# Patient Record
Sex: Female | Born: 1935 | Race: White | Hispanic: No | Smoking: Never smoker
Health system: Southern US, Community
[De-identification: ages and names within clinical notes are randomized; demographics above are authoritative.]

## PROBLEM LIST (undated history)

## (undated) DIAGNOSIS — R519 Headache, unspecified: Secondary | ICD-10-CM

## (undated) DIAGNOSIS — M810 Age-related osteoporosis without current pathological fracture: Secondary | ICD-10-CM

## (undated) DIAGNOSIS — I5189 Other ill-defined heart diseases: Secondary | ICD-10-CM

## (undated) DIAGNOSIS — E785 Hyperlipidemia, unspecified: Secondary | ICD-10-CM

## (undated) DIAGNOSIS — Z9289 Personal history of other medical treatment: Secondary | ICD-10-CM

## (undated) DIAGNOSIS — R51 Headache: Secondary | ICD-10-CM

## (undated) DIAGNOSIS — I480 Paroxysmal atrial fibrillation: Secondary | ICD-10-CM

## (undated) DIAGNOSIS — D649 Anemia, unspecified: Secondary | ICD-10-CM

## (undated) DIAGNOSIS — M1990 Unspecified osteoarthritis, unspecified site: Secondary | ICD-10-CM

## (undated) DIAGNOSIS — C801 Malignant (primary) neoplasm, unspecified: Secondary | ICD-10-CM

## (undated) HISTORY — DX: Personal history of other medical treatment: Z92.89

## (undated) HISTORY — PX: EYE SURGERY: SHX253

## (undated) HISTORY — DX: Other ill-defined heart diseases: I51.89

---

## 2004-08-30 ENCOUNTER — Ambulatory Visit: Payer: Self-pay | Admitting: Internal Medicine

## 2005-06-28 ENCOUNTER — Encounter: Payer: Self-pay | Admitting: Internal Medicine

## 2005-09-01 ENCOUNTER — Ambulatory Visit: Payer: Self-pay | Admitting: Internal Medicine

## 2009-01-21 ENCOUNTER — Ambulatory Visit: Payer: Self-pay | Admitting: Internal Medicine

## 2010-02-15 ENCOUNTER — Ambulatory Visit: Payer: Self-pay | Admitting: Internal Medicine

## 2010-08-28 ENCOUNTER — Observation Stay: Payer: Self-pay | Admitting: Surgery

## 2011-01-26 ENCOUNTER — Ambulatory Visit: Payer: Self-pay | Admitting: Internal Medicine

## 2011-03-16 ENCOUNTER — Ambulatory Visit: Payer: Self-pay | Admitting: Internal Medicine

## 2011-06-09 ENCOUNTER — Ambulatory Visit: Payer: Self-pay | Admitting: Internal Medicine

## 2013-04-04 ENCOUNTER — Encounter: Payer: Self-pay | Admitting: *Deleted

## 2013-04-08 ENCOUNTER — Encounter: Payer: Self-pay | Admitting: Podiatry

## 2013-04-08 ENCOUNTER — Ambulatory Visit (INDEPENDENT_AMBULATORY_CARE_PROVIDER_SITE_OTHER): Payer: Medicare Other | Admitting: Podiatry

## 2013-04-08 VITALS — BP 105/52 | HR 80 | Resp 16 | Ht 65.0 in | Wt 127.0 lb

## 2013-04-08 DIAGNOSIS — B351 Tinea unguium: Secondary | ICD-10-CM

## 2013-04-08 DIAGNOSIS — M79609 Pain in unspecified limb: Secondary | ICD-10-CM

## 2013-04-08 NOTE — Progress Notes (Signed)
Christy Waters presents today for followup of her painful mycotic nails 1 through 5 bilateral.  Objective: Vital signs are stable she is alert and oriented x3. Pulses remain palpable bilateral lower extremity. Nails are thick yellow dystrophic onychomycotic and painful bilateral.  Assessment: Pain in limb secondary to onychomycosis.  Plan: Debridement of nails 1 through 5 bilateral is cover service secondary to pain followup with her in 3 months.

## 2013-06-17 ENCOUNTER — Ambulatory Visit (INDEPENDENT_AMBULATORY_CARE_PROVIDER_SITE_OTHER): Payer: Medicare Other | Admitting: Podiatry

## 2013-06-17 ENCOUNTER — Encounter: Payer: Self-pay | Admitting: Podiatry

## 2013-06-17 VITALS — BP 150/81 | HR 85 | Resp 16 | Ht 66.0 in | Wt 124.0 lb

## 2013-06-17 DIAGNOSIS — B351 Tinea unguium: Secondary | ICD-10-CM

## 2013-06-17 DIAGNOSIS — M79609 Pain in unspecified limb: Secondary | ICD-10-CM

## 2013-06-17 NOTE — Progress Notes (Signed)
She presents today with a chief complaint of painful toenails one through 5 bilateral.  Objective: Vital signs are stable she is alert and oriented x3. Pulses are palpable bilateral. Multiple varicosities noted bilateral. No open lesions noted. She has thick yellow dystrophic clinically mycotic nails which are painful palpation as well as debridement.  Assessment: Pain in limb secondary to onychomycosis 1 through 5 bilateral.  Plan: Debridement of nails 1 through 5 bilateral is a covered service secondary to pain.

## 2013-07-13 ENCOUNTER — Emergency Department: Payer: Self-pay | Admitting: Emergency Medicine

## 2013-07-13 LAB — BASIC METABOLIC PANEL
Anion Gap: 6 — ABNORMAL LOW (ref 7–16)
BUN: 25 mg/dL — ABNORMAL HIGH (ref 7–18)
CHLORIDE: 104 mmol/L (ref 98–107)
Calcium, Total: 9.1 mg/dL (ref 8.5–10.1)
Co2: 28 mmol/L (ref 21–32)
Creatinine: 0.93 mg/dL (ref 0.60–1.30)
EGFR (African American): >60
EGFR (Non-African Amer.): 53 — ABNORMAL LOW
Glucose: 137 mg/dL — ABNORMAL HIGH (ref 65–99)
Osmolality: 282 (ref 275–301)
POTASSIUM: 3.4 mmol/L — AB (ref 3.5–5.1)
Sodium: 138 mmol/L (ref 136–145)

## 2013-07-13 LAB — CBC WITH DIFFERENTIAL/PLATELET
BASOS ABS: 0 10*3/uL (ref 0.0–0.1)
BASOS PCT: 0.5 %
EOS PCT: 1.1 %
Eosinophil #: 0.1 10*3/uL (ref 0.0–0.7)
HCT: 34.3 % — ABNORMAL LOW (ref 35.0–47.0)
HGB: 11.4 g/dL — ABNORMAL LOW (ref 12.0–16.0)
LYMPHS PCT: 10.6 %
Lymphocyte #: 0.8 10*3/uL — ABNORMAL LOW (ref 1.0–3.6)
MCH: 32.1 pg (ref 26.0–34.0)
MCHC: 33.2 g/dL (ref 32.0–36.0)
MCV: 97 fL (ref 80–100)
MONOS PCT: 10.2 %
Monocyte #: 0.8 x10 3/mm (ref 0.2–0.9)
NEUTROS ABS: 6.2 10*3/uL (ref 1.4–6.5)
NEUTROS PCT: 77.6 %
Platelet: 203 10*3/uL (ref 150–440)
RBC: 3.55 10*6/uL — ABNORMAL LOW (ref 3.80–5.20)
RDW: 13.3 % (ref 11.5–14.5)
WBC: 8 10*3/uL (ref 3.6–11.0)

## 2013-07-13 LAB — URINALYSIS, COMPLETE
BACTERIA: NONE SEEN
BILIRUBIN, UR: NEGATIVE
Blood: NEGATIVE
Glucose,UR: 50 mg/dL (ref 0–75)
Hyaline Cast: 4
KETONE: NEGATIVE
Leukocyte Esterase: NEGATIVE
NITRITE: NEGATIVE
PH: 6 (ref 4.5–8.0)
Protein: 30
RBC,UR: 2 /HPF (ref 0–5)
Specific Gravity: 1.015 (ref 1.003–1.030)
Squamous Epithelial: 1
WBC UR: 1 /HPF (ref 0–5)

## 2013-07-13 LAB — TROPONIN I: Troponin-I: <0.02 ng/mL

## 2013-08-19 ENCOUNTER — Ambulatory Visit (INDEPENDENT_AMBULATORY_CARE_PROVIDER_SITE_OTHER): Payer: Medicare Other | Admitting: Podiatry

## 2013-08-19 VITALS — BP 122/65 | HR 67 | Resp 16 | Ht 66.0 in | Wt 127.0 lb

## 2013-08-19 DIAGNOSIS — B351 Tinea unguium: Secondary | ICD-10-CM

## 2013-08-19 DIAGNOSIS — M79609 Pain in unspecified limb: Secondary | ICD-10-CM

## 2013-08-19 NOTE — Progress Notes (Signed)
She presents today with a chief complaint of painful E. elongated toenails.  Objective: Vital signs are stable she is alert and oriented x3. Pulses are palpable bilateral. Multiple varicosities are noted. There no iatrogenic lesions and no cellulitis. Nails are thick yellow dystrophic onychomycotic and painful palpation.  Assessment: Pain in limb secondary to onychomycosis 1 through 5 bilateral.  Plan: Debridement of nails 1 through 5 bilateral covered service secondary to pain.

## 2013-10-03 DIAGNOSIS — I10 Essential (primary) hypertension: Secondary | ICD-10-CM | POA: Insufficient documentation

## 2013-10-03 DIAGNOSIS — M810 Age-related osteoporosis without current pathological fracture: Secondary | ICD-10-CM | POA: Insufficient documentation

## 2013-10-03 DIAGNOSIS — I493 Ventricular premature depolarization: Secondary | ICD-10-CM | POA: Insufficient documentation

## 2013-10-03 DIAGNOSIS — J8410 Pulmonary fibrosis, unspecified: Secondary | ICD-10-CM | POA: Insufficient documentation

## 2013-10-03 DIAGNOSIS — E785 Hyperlipidemia, unspecified: Secondary | ICD-10-CM | POA: Insufficient documentation

## 2013-10-03 DIAGNOSIS — M159 Polyosteoarthritis, unspecified: Secondary | ICD-10-CM | POA: Insufficient documentation

## 2013-11-18 ENCOUNTER — Ambulatory Visit: Payer: Medicare Other | Admitting: Podiatry

## 2013-12-12 ENCOUNTER — Ambulatory Visit: Payer: Self-pay | Admitting: Internal Medicine

## 2013-12-27 ENCOUNTER — Encounter: Payer: Self-pay | Admitting: Podiatry

## 2013-12-27 ENCOUNTER — Ambulatory Visit (INDEPENDENT_AMBULATORY_CARE_PROVIDER_SITE_OTHER): Payer: Medicare Other | Admitting: Podiatry

## 2013-12-27 DIAGNOSIS — M79609 Pain in unspecified limb: Secondary | ICD-10-CM

## 2013-12-27 DIAGNOSIS — M79676 Pain in unspecified toe(s): Secondary | ICD-10-CM

## 2013-12-27 DIAGNOSIS — B351 Tinea unguium: Secondary | ICD-10-CM

## 2013-12-28 NOTE — Progress Notes (Signed)
She presents today with a chief complaint of painful elongated toenails.  Objective: Nails are thick yellow dystrophic onychomycotic and painful palpation.  Assessment: Pain in limb secondary to onychomycosis 1 through 5 bilateral.  Plan: Debridement of nails 1 through 5 bilateral covered service secondary to pain.

## 2014-02-06 DIAGNOSIS — N393 Stress incontinence (female) (male): Secondary | ICD-10-CM | POA: Insufficient documentation

## 2014-02-06 DIAGNOSIS — R109 Unspecified abdominal pain: Secondary | ICD-10-CM | POA: Insufficient documentation

## 2014-02-06 DIAGNOSIS — I340 Nonrheumatic mitral (valve) insufficiency: Secondary | ICD-10-CM | POA: Insufficient documentation

## 2014-02-13 ENCOUNTER — Ambulatory Visit: Payer: Self-pay | Admitting: Gastroenterology

## 2014-02-21 ENCOUNTER — Ambulatory Visit: Payer: Self-pay | Admitting: Internal Medicine

## 2014-03-05 DIAGNOSIS — J84112 Idiopathic pulmonary fibrosis: Secondary | ICD-10-CM | POA: Insufficient documentation

## 2014-03-05 DIAGNOSIS — I700 Atherosclerosis of aorta: Secondary | ICD-10-CM | POA: Insufficient documentation

## 2014-03-05 DIAGNOSIS — G609 Hereditary and idiopathic neuropathy, unspecified: Secondary | ICD-10-CM | POA: Insufficient documentation

## 2014-03-31 ENCOUNTER — Ambulatory Visit (INDEPENDENT_AMBULATORY_CARE_PROVIDER_SITE_OTHER): Payer: Medicare Other | Admitting: Podiatry

## 2014-03-31 DIAGNOSIS — M79676 Pain in unspecified toe(s): Secondary | ICD-10-CM

## 2014-03-31 DIAGNOSIS — B351 Tinea unguium: Secondary | ICD-10-CM

## 2014-03-31 NOTE — Progress Notes (Signed)
Presents today chief complaint of painful elongated toenails.  Objective: Pulses are palpable bilateral nails are thick, yellow dystrophic onychomycosis and painful palpation.   Assessment: Onychomycosis with pain in limb.  Plan: Treatment of nails in thickness and length as covered service secondary to pain.  

## 2014-04-01 DIAGNOSIS — R1084 Generalized abdominal pain: Secondary | ICD-10-CM | POA: Insufficient documentation

## 2014-05-20 DIAGNOSIS — K580 Irritable bowel syndrome with diarrhea: Secondary | ICD-10-CM | POA: Insufficient documentation

## 2014-06-18 ENCOUNTER — Ambulatory Visit (INDEPENDENT_AMBULATORY_CARE_PROVIDER_SITE_OTHER): Payer: Medicare Other | Admitting: Podiatry

## 2014-06-18 DIAGNOSIS — B351 Tinea unguium: Secondary | ICD-10-CM

## 2014-06-18 DIAGNOSIS — M79676 Pain in unspecified toe(s): Secondary | ICD-10-CM

## 2014-06-18 NOTE — Progress Notes (Signed)
She presents today with chief complaint of painful elongated nails 1 through 5 bilateral.  Objective: Pulses are strongly palpable bilateral. Nails are thick yellow dystrophic mycotic mycotic and painful palpation.  Assessment: Pain in limb secondary to onychomycosis 1 through 5 bilateral.  Plan: Debridement of nails 1 through 5 bilateral covered service secondary to pain. Follow up with her in 3 months.

## 2014-09-01 ENCOUNTER — Ambulatory Visit
Admit: 2014-09-01 | Disposition: A | Discharge status: Home / Self Care | Payer: Self-pay | Attending: Urology | Admitting: Urology

## 2014-09-15 ENCOUNTER — Ambulatory Visit (INDEPENDENT_AMBULATORY_CARE_PROVIDER_SITE_OTHER): Payer: Medicare Other | Admitting: Podiatry

## 2014-09-15 DIAGNOSIS — M79676 Pain in unspecified toe(s): Secondary | ICD-10-CM | POA: Diagnosis not present

## 2014-09-15 DIAGNOSIS — B351 Tinea unguium: Secondary | ICD-10-CM

## 2014-09-15 NOTE — Progress Notes (Signed)
She presents today with chief complaint of painful elongated nails 1 through 5 bilateral.  Objective: Pulses are strongly palpable bilateral. Nails are thick yellow dystrophic mycotic mycotic and painful palpation.  Assessment: Pain in limb secondary to onychomycosis 1 through 5 bilateral.  Plan: Debridement of nails 1 through 5 bilateral covered service secondary to pain. Follow up with her in 3 months.

## 2014-11-25 ENCOUNTER — Ambulatory Visit (INDEPENDENT_AMBULATORY_CARE_PROVIDER_SITE_OTHER): Payer: Medicare Other | Admitting: Podiatry

## 2014-11-25 DIAGNOSIS — B351 Tinea unguium: Secondary | ICD-10-CM

## 2014-11-25 DIAGNOSIS — M79676 Pain in unspecified toe(s): Secondary | ICD-10-CM | POA: Diagnosis not present

## 2014-11-25 NOTE — Progress Notes (Signed)
Subjective: 81 y.o.-year-old female returns the office today for painful, elongated, thickened toenails which she is unable to trim herself. Denies any redness or drainage around the nails. Denies any acute changes since last appointment and no new complaints today. Denies any systemic complaints such as fevers, chills, nausea, vomiting.   Objective: AAO 3, NAD DP/PT pulses palpable, CRT less than 3 seconds Nails hypertrophic, dystrophic, elongated, brittle, discolored 10. There is tenderness overlying the nails 1-5 bilaterally. There is no surrounding erythema or drainage along the nail sites. No open lesions or pre-ulcerative lesions are identified. No other areas of tenderness bilateral lower extremities. No overlying edema, erythema, increased warmth. No pain with calf compression, swelling, warmth, erythema.  Assessment: Patient presents with symptomatic onychomycosis  Plan: -Treatment options including alternatives, risks, complications were discussed -Nails sharply debrided 10 without complication/bleeding. -Discussed daily foot inspection. If there are any changes, to call the office immediately.  -Follow-up in 3 months or sooner if any problems are to arise. In the meantime, encouraged to call the office with any questions, concerns, changes symptoms.  Celesta Gentile, DPM

## 2014-12-22 ENCOUNTER — Ambulatory Visit: Payer: Medicare Other

## 2015-02-16 ENCOUNTER — Ambulatory Visit: Payer: Medicare Other

## 2015-02-26 ENCOUNTER — Ambulatory Visit: Payer: Medicare Other

## 2015-03-11 ENCOUNTER — Other Ambulatory Visit: Payer: Self-pay | Admitting: Internal Medicine

## 2015-03-11 DIAGNOSIS — R1031 Right lower quadrant pain: Secondary | ICD-10-CM

## 2015-03-12 ENCOUNTER — Ambulatory Visit (INDEPENDENT_AMBULATORY_CARE_PROVIDER_SITE_OTHER): Payer: Medicare Other | Admitting: Podiatry

## 2015-03-12 DIAGNOSIS — B351 Tinea unguium: Secondary | ICD-10-CM | POA: Diagnosis not present

## 2015-03-12 DIAGNOSIS — M79676 Pain in unspecified toe(s): Secondary | ICD-10-CM | POA: Diagnosis not present

## 2015-03-12 NOTE — Progress Notes (Signed)
Subjective: 81 y.o.-year-old female returns the office today for painful, elongated, thickened toenails which she is unable to trim herself. Denies any redness or drainage around the nails. Denies any acute changes since last appointment and no new complaints today. Denies any systemic complaints such as fevers, chills, nausea, vomiting.   Objective: AAO 3, NAD DP/PT pulses palpable, CRT less than 3 seconds Nails hypertrophic, dystrophic, elongated, brittle, discolored 10. There is tenderness overlying the nails 1-5 bilaterally. There is no surrounding erythema or drainage along the nail sites. No open lesions or pre-ulcerative lesions are identified. No other areas of tenderness bilateral lower extremities. No overlying edema, erythema, increased warmth. No pain with calf compression, swelling, warmth, erythema.  Assessment: Patient presents with symptomatic onychomycosis  Plan: -Treatment options including alternatives, risks, complications were discussed -Nails sharply debrided 10 without complication/bleeding. -Discussed daily foot inspection. If there are any changes, to call the office immediately.  -Follow-up in 3 months or sooner if any problems are to arise. In the meantime, encouraged to call the office with any questions, concerns, changes symptoms.  Celesta Gentile, DPM

## 2015-03-16 ENCOUNTER — Ambulatory Visit
Admission: RE | Admit: 2015-03-16 | Discharge: 2015-03-16 | Disposition: A | Discharge status: Home / Self Care | Payer: Medicare Other | Source: Ambulatory Visit | Attending: Internal Medicine | Admitting: Internal Medicine

## 2015-03-16 DIAGNOSIS — R1031 Right lower quadrant pain: Secondary | ICD-10-CM | POA: Diagnosis not present

## 2015-03-16 HISTORY — DX: Malignant (primary) neoplasm, unspecified: C80.1

## 2015-03-16 MED ORDER — IOHEXOL 300 MG/ML  SOLN
75.0000 mL | Freq: Once | INTRAMUSCULAR | Status: AC | PRN
  Administered 2015-03-16: 75 mL via INTRAVENOUS

## 2015-04-14 DIAGNOSIS — M889 Osteitis deformans of unspecified bone: Secondary | ICD-10-CM | POA: Insufficient documentation

## 2015-05-02 ENCOUNTER — Encounter: Payer: Self-pay | Admitting: Intensive Care

## 2015-05-02 ENCOUNTER — Emergency Department
Admission: EM | Admit: 2015-05-02 | Discharge: 2015-05-02 | Disposition: A | Discharge status: Home / Self Care | Payer: Medicare Other | Attending: Emergency Medicine | Admitting: Emergency Medicine

## 2015-05-02 DIAGNOSIS — Z880 Allergy status to penicillin: Secondary | ICD-10-CM | POA: Diagnosis not present

## 2015-05-02 DIAGNOSIS — N390 Urinary tract infection, site not specified: Secondary | ICD-10-CM

## 2015-05-02 DIAGNOSIS — R945 Abnormal results of liver function studies: Secondary | ICD-10-CM

## 2015-05-02 DIAGNOSIS — R42 Dizziness and giddiness: Secondary | ICD-10-CM | POA: Diagnosis not present

## 2015-05-02 DIAGNOSIS — Z79899 Other long term (current) drug therapy: Secondary | ICD-10-CM | POA: Insufficient documentation

## 2015-05-02 DIAGNOSIS — R7989 Other specified abnormal findings of blood chemistry: Secondary | ICD-10-CM | POA: Insufficient documentation

## 2015-05-02 DIAGNOSIS — R55 Syncope and collapse: Secondary | ICD-10-CM | POA: Diagnosis present

## 2015-05-02 LAB — CBC WITH DIFFERENTIAL/PLATELET
Basophils Absolute: 0 10*3/uL (ref 0–0.1)
Basophils Relative: 1 %
Eosinophils Absolute: 0.1 10*3/uL (ref 0–0.7)
Eosinophils Relative: 2 %
HEMATOCRIT: 37.7 % (ref 35.0–47.0)
HEMOGLOBIN: 12.5 g/dL (ref 12.0–16.0)
LYMPHS ABS: 0.6 10*3/uL — AB (ref 1.0–3.6)
Lymphocytes Relative: 15 %
MCH: 33.2 pg (ref 26.0–34.0)
MCHC: 33.3 g/dL (ref 32.0–36.0)
MCV: 99.6 fL (ref 80.0–100.0)
MONOS PCT: 9 %
Monocytes Absolute: 0.4 10*3/uL (ref 0.2–0.9)
NEUTROS ABS: 3.1 10*3/uL (ref 1.4–6.5)
NEUTROS PCT: 73 %
Platelets: 199 10*3/uL (ref 150–440)
RBC: 3.78 MIL/uL — ABNORMAL LOW (ref 3.80–5.20)
RDW: 14.6 % — ABNORMAL HIGH (ref 11.5–14.5)
WBC: 4.3 10*3/uL (ref 3.6–11.0)

## 2015-05-02 LAB — COMPREHENSIVE METABOLIC PANEL
ALBUMIN: 3.9 g/dL (ref 3.5–5.0)
ALT: 89 U/L — AB (ref 14–54)
AST: 91 U/L — ABNORMAL HIGH (ref 15–41)
Alkaline Phosphatase: 243 U/L — ABNORMAL HIGH (ref 38–126)
Anion gap: 7 (ref 5–15)
BUN: 23 mg/dL — ABNORMAL HIGH (ref 6–20)
CHLORIDE: 106 mmol/L (ref 101–111)
CO2: 26 mmol/L (ref 22–32)
CREATININE: 0.72 mg/dL (ref 0.44–1.00)
Calcium: 7.9 mg/dL — ABNORMAL LOW (ref 8.9–10.3)
GFR calc non Af Amer: >60 mL/min (ref >60)
Glucose, Bld: 153 mg/dL — ABNORMAL HIGH (ref 65–99)
Potassium: 3.7 mmol/L (ref 3.5–5.1)
SODIUM: 139 mmol/L (ref 135–145)
Total Bilirubin: 0.3 mg/dL (ref 0.3–1.2)
Total Protein: 7.1 g/dL (ref 6.5–8.1)

## 2015-05-02 LAB — URINALYSIS COMPLETE WITH MICROSCOPIC (ARMC ONLY)
BACTERIA UA: NONE SEEN
Bilirubin Urine: NEGATIVE
Glucose, UA: NEGATIVE mg/dL
Hgb urine dipstick: NEGATIVE
Ketones, ur: NEGATIVE mg/dL
NITRITE: NEGATIVE
PROTEIN: NEGATIVE mg/dL
SPECIFIC GRAVITY, URINE: 1.023 (ref 1.005–1.030)
pH: 5 (ref 5.0–8.0)

## 2015-05-02 LAB — TROPONIN I: Troponin I: <0.03 ng/mL (ref <0.031)

## 2015-05-02 MED ORDER — NITROFURANTOIN MONOHYD MACRO 100 MG PO CAPS: 100.0000 mg | ORAL_CAPSULE | Freq: Two times a day (BID) | ORAL | Status: AC

## 2015-05-02 MED ORDER — SODIUM CHLORIDE 0.9 % IV BOLUS (SEPSIS)
500.0000 mL | Freq: Once | INTRAVENOUS | Status: AC
  Administered 2015-05-02: 500 mL via INTRAVENOUS

## 2015-05-02 NOTE — ED Notes (Signed)
Patient arrived by EMS from home. Patient was at home and was eating cherry pie. While eating the pie she felt she needed to use the bathroom and wasn't able to make it to the bathroom. Patient states "I knew I was going to pass out so I had to lay down in the floor and called EMS"

## 2015-05-02 NOTE — ED Provider Notes (Addendum)
[facility] Emergency Department Provider Note  ____________________________________________   I have reviewed the triage vital signs and the nursing notes.   HISTORY  Chief Complaint Near Syncope    HPI Christy Waters is a 81 y.o. female history is actually somewhat divergent from what was told by EMS. Patient states that she was eating a cherry dessert, as she knew that she had to have a bowel movement. She got up to go to the bathroom in a hurry and when she stood up she felt lightheaded. She did not pass out. She went to the bathroom had a bowel movement which she states was not bloody or black to her knowledge nor was a diarrheal, and then she still felt somewhat lightheaded when she stood up from the bathroom so she lay down on the floor. She has no abdominal pain, no chest pain or shortness of breath no nausea no vomiting no headache no weakness no numbness no focal complaint she does not feel dizzy or lightheaded at this time and she states that she would prefer not to be here and does not wish to stay. She has no complaints of any variety. 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 She would prefer not to have a rectal exam.  Past Medical History  Diagnosis Date  . High cholesterol   . Cancer ([place])     skin cancer    There are no active problems to display for this patient.   Past Surgical History  Procedure Laterality Date  . Eye surgery Right     Current Outpatient Rx  Name  Route  Sig  Dispense  Refill  . cetirizine (ZYRTEC) 10 MG tablet               . LUMIGAN 0.01 % SOLN               . metoprolol succinate (TOPROL-XL) 25 MG 24 hr tablet                 Allergies Azithromycin; Clindamycin/lincomycin; Codeine; and Penicillins  History reviewed. No pertinent family history.  Social History Social History  Substance Use Topics  . Smoking status: Never Smoker   . Smokeless tobacco: Never Used  .  Alcohol Use: No    Review of Systems Constitutional: No fever/chills Eyes: No visual changes. ENT: No sore throat. No stiff neck no neck pain Cardiovascular: Denies chest pain. Respiratory: Denies shortness of breath. Gastrointestinal:   no vomiting.  No diarrhea.  No constipation. Genitourinary: Negative for dysuria. Musculoskeletal: Negative lower extremity swelling Skin: Negative for rash. Neurological: Negative for headaches, focal weakness or numbness. 10-point ROS otherwise negative.  ____________________________________________   PHYSICAL EXAM:  VITAL SIGNS: ED Triage Vitals  Enc Vitals Group     BP 05/02/15 1522 141/77 mmHg     Pulse Rate 05/02/15 1522 66     Resp --      Temp 05/02/15 1522 97.6 F (36.4 C)     Temp Source 05/02/15 1522 Oral     SpO2 05/02/15 1522 100 %     Weight 05/02/15 1522 119 lb (53.978 kg)     Height 05/02/15 1522 5\' 5"  (1.651 m)     Head Cir --      Peak Flow --      Pain Score --      Pain Loc --      Pain Edu? --      Excl. in  GC? --     Constitutional: Alert and oriented. Well appearing and in no acute distress. Eyes: Conjunctivae are normal. PERRL. EOMI. Head: Atraumatic. Nose: No congestion/rhinnorhea. Mouth/Throat: Mucous membranes are moist.  Oropharynx non-erythematous. Neck: No stridor.   Nontender with no meningismus Cardiovascular: Normal rate, regular rhythm. Grossly normal heart sounds.  Good peripheral circulation. Respiratory: Normal respiratory effort.  No retractions. Lungs CTAB. Abdominal: Soft and nontender. No distention. No guarding no rebound Back:  There is no focal tenderness or step off there is no midline tenderness there are no lesions noted. there is no CVA tenderness Musculoskeletal: No lower extremity tenderness. No joint effusions, no DVT signs strong distal pulses no edema Neurologic:  Normal speech and language. No gross focal neurologic deficits are appreciated.  Skin:  Skin is warm, dry and  intact. No rash noted. Psychiatric: Mood and affect are normal. Speech and behavior are normal.  ____________________________________________   LABS (all labs ordered are listed, but only abnormal results are displayed)  Labs Reviewed  URINALYSIS COMPLETEWITH MICROSCOPIC ([place])  COMPREHENSIVE METABOLIC PANEL  CBC WITH DIFFERENTIAL/PLATELET  TROPONIN I   ____________________________________________  EKG  I personally interpreted any EKGs ordered by me or triage  ____________________________________________  RADIOLOGY  I reviewed any imaging ordered by me or triage that were performed during my shift ____________________________________________   PROCEDURES  Procedure(s) performed: None  Critical Care performed: None  ____________________________________________   INITIAL IMPRESSION / ASSESSMENT AND PLAN / ED COURSE  Pertinent labs & imaging results that were available during my care of the patient were reviewed by me and considered in my medical decision making (see chart for details).  Patient presents with feeling lightheaded after standing up in a hurry to go to the bathroom. This is not unusual at age 4. Her abdominal exam is benign, she has absolutely no complaints of any variety, her workups thus far reassuring. She has a reassuring exam. She would prefer to go home. She is not orthostatic at this time. There is no evidence of significant GI bleed she does not appear to be anemic. We will watch her in the emergency room as a precaution, check basic blood work given her age alone, and we will, if she remains in good health and declining further intervention send her home  ----------------------------------------- 4:57 PM on 05/02/2015 -----------------------------------------  There is a reading of 87% on the pulse ox for this patient. It isn't accurate. I was in the room at the time, patient sats are fine at 100%, she had partially taken off her pulse ox  monitor  ----------------------------------------- 6:22 PM on 05/02/2015 -----------------------------------------  Patient's only complaint is finger, daughter at the bedside. Patient at her baseline. May have a small urinary tract infection, we will treat with Macrobid and send a culture. Again that noted was one reading of 87 on her pulse ox and one reading of 41 on her heart rate monitor that has not been repeated and again there was significant problem with the monitor in the pulse ox at that time. I do not think that it likely was real. I have offered admission to the patient and the daughter given her age and her presyncopal symptomatology, and they both refused. The daughter will stay with the patient overnight tonight. Given that they decline admission and there workup was otherwise reassuring here, and they do have a doctor they can see him Monday we will discharge her. I have done serial abdominal exams on the patient and there is no  evidence of discomfort. Her electrolytes are reassuring her BUN and creatinine are normal for her, there is a slight bump in her liver function tests, but she has no right upper quadrant pain nausea or abdominal discomfort. This will certainly need to be followed up In the family aware. She does not have an elevated white count. There is no evidence of cholecystitis or cholangitis. The patient has no chest pain or shortness of breath and "feels great" and remains eager to go home as soon as I release her.  ----------------------------------------- 6:29 PM on 05/02/2015 -----------------------------------------  The patient's daughter brought her sandwich which she has eaten with no complication, there is absolute no right upper quadrant pain to palpation, no fever no white count no vomiting. There've prefer not to stay for offered ultrasound or further evaluation. Patient states she is ready to go. Return precautions have been given to patient and daughter  daughter will stay with patient today, and we will have her follow up closely with her primary care doctor on Monday. ____________________________________________   FINAL CLINICAL IMPRESSION(S) / ED DIAGNOSES  Final diagnoses:  None     Schuyler Amor, MD 05/02/15 [place], MD 05/02/15 [place], MD 05/02/15 [place], MD 05/02/15 [phone]

## 2015-05-02 NOTE — Discharge Instructions (Signed)
It was a pleasure to take care of use today. He would prefer to not stay in the hospital which is certainly your choice and not unreasonable but that does limit what we can evaluate you for as we discussed. We do noticed that you've a reflection tests are slightly elevated we asked to see her doctor for this on Monday. If you have any chest pain shortness of breath nausea vomiting diarrhea bleeding from your bottom fever or weakness or you feel worse in any way please return to the emergency department. Follow closely with your doctor as noted and stay with your daughter tonight.

## 2015-05-02 NOTE — ED Notes (Signed)
Patient ambulated to and from room commode with steady gait.

## 2015-05-04 LAB — URINE CULTURE

## 2015-06-04 ENCOUNTER — Ambulatory Visit: Payer: Medicare Other | Admitting: Podiatry

## 2015-06-09 ENCOUNTER — Ambulatory Visit: Payer: Medicare Other | Admitting: Podiatry

## 2015-06-18 ENCOUNTER — Ambulatory Visit: Payer: Medicare Other | Admitting: Podiatry

## 2015-06-18 ENCOUNTER — Ambulatory Visit: Payer: Medicare Other

## 2015-06-19 ENCOUNTER — Ambulatory Visit: Payer: Medicare Other | Admitting: Sports Medicine

## 2015-07-23 ENCOUNTER — Ambulatory Visit: Payer: Medicare Other | Admitting: Physical Therapy

## 2015-08-17 ENCOUNTER — Ambulatory Visit: Payer: Medicare Other | Attending: Internal Medicine | Admitting: Physical Therapy

## 2015-08-17 ENCOUNTER — Encounter: Payer: Self-pay | Admitting: Physical Therapy

## 2015-08-17 DIAGNOSIS — R269 Unspecified abnormalities of gait and mobility: Secondary | ICD-10-CM | POA: Diagnosis not present

## 2015-08-17 DIAGNOSIS — R531 Weakness: Secondary | ICD-10-CM | POA: Insufficient documentation

## 2015-08-17 NOTE — Therapy (Signed)
[place] MAIN [facility] SERVICES [address] [place], [place], [address] Phone: [phone]   Fax:  [phone]  Physical Therapy Evaluation  Patient Details  Name: Christy Waters Last MRN: [id number] Date of Birth: [birth year] Referring Provider: Dr. Rusty Aus  Encounter Date: 08/17/2015      PT End of Session - 08/17/15 1501    Visit Number 1   Number of Visits 17   Date for PT Re-Evaluation 09/28/15   Authorization Type gcode 1   Authorization Time Period 10   PT Start Time 1345   PT Stop Time 1440   PT Time Calculation (min) 55 min   Equipment Utilized During Treatment Gait belt   Activity Tolerance Patient tolerated treatment well   Behavior During Therapy [facility] for tasks assessed/performed      Past Medical History  Diagnosis Date  . Cancer ([place])     skin cancer; some spots on left side of face; been 5 years since last treated;   . High cholesterol     Past Surgical History  Procedure Laterality Date  . Eye surgery Right     cataract surgery schedule for Easter Monday 2017;     There were no vitals filed for this visit.  Visit Diagnosis:  Abnormality of gait - Plan: PT plan of care cert/re-cert  Weakness - Plan: PT plan of care cert/re-cert      Subjective Assessment - 08/17/15 1354    Subjective 82 yo Female reports impaired balance and recent falls. She reports falling in her flower bed and feeling fearful of doing yardwork. Patient reports her last fall was about 1.5 months ago. She presents to therapy without AD; She reports occasionally using a cane, but doesn't use it much; Patient has a personal aide who comes Tues/Thurs who helps with driving and doing errands. She does have a housekeeper; She does fix her own meals; She denies any numbness/tingling; She denies any dizziness; She reports having trouble with her balance for about 2 years; She reports she went to the dentist and was given clindomyacin and had a  bad reaction; Reports increased bilateral knee pain with burning pain to ankles; She reports pain has been going on for about a year;    Pertinent History personal factors affecting rehab: poor vision, getting eye surgery in April 2017; poor footwear (wears shoes with heels, due to history of plantar fasciits); history of fall, chronic knee pain;    Limitations Standing;Walking;Reading   How long can you sit comfortably? >2 hours   How long can you stand comfortably? 30-60 min;   How long can you walk comfortably? about 500 feet; She reports having trouble walking without holding onto something;    Patient Stated Goals reduce fall risk; feel less afraid of falling; gain strength in legs;    Currently in Pain? Yes   Pain Score 4    Pain Location Knee   Pain Orientation Right;Left   Pain Descriptors / Indicators Burning   Pain Type Chronic pain   Pain Radiating Towards ankles   Pain Onset More than a month ago   Aggravating Factors  hurts when she gets up with transfers; some pain with walking;    Pain Relieving Factors rest;    Effect of Pain on Daily Activities tries to push through it;             [facility] PT Assessment - 08/17/15 0001    Assessment   Medical Diagnosis Disequilibrium  Referring Provider Dr. Rusty Aus   Onset Date/Surgical Date 07/29/14   Hand Dominance Right   Next MD Visit September 11, 2015   Prior Therapy denies any PT for this condition;    Precautions   Precautions Fall   Restrictions   Weight Bearing Restrictions No   Balance Screen   Has the patient fallen in the past 6 months Yes   How many times? 1   Has the patient had a decrease in activity level because of a fear of falling?  Yes   Is the patient reluctant to leave their home because of a fear of falling?  Yes   Home Environment   Additional Comments Lives in single leve home, 3-4 steps to enter with left railing; Lives with son; has home aide 2x a week;    Prior Function   Level of  Independence Independent;Independent with basic ADLs;Independent with gait;Independent with transfers   Vocation Retired   Leisure gardening, reading, has clubs; bible study; patient did quit driving;    Cognition   Overall Cognitive Status Within Functional Limits for tasks assessed   Observation/Other Assessments   Observations wears shoes with heels; reports having to wear shoes with heel (wedge) and with heel cut out due to history of plantar fasciitis. This could be contributing to imbalance;    Sensation   Light Touch Appears Intact   Proprioception Appears Intact   Coordination   Gross Motor Movements are Fluid and Coordinated Yes   Fine Motor Movements are Fluid and Coordinated Yes   Finger Nose Finger Test accurate bilaterally;   AROM   Overall AROM Comments BUE and BLE AROM is [facility]   Strength   Overall Strength Comments BUE grossly 4/5   Right Hip Flexion 4/5   Right Hip Extension 4-/5   Right Hip ABduction 4/5   Right Hip ADduction 4/5   Left Hip Flexion 4/5   Left Hip Extension 4-/5   Left Hip ABduction 4/5   Left Hip ADduction 4/5   Right Knee Flexion 4-/5   Right Knee Extension 4-/5   Left Knee Flexion 4-/5   Left Knee Extension 4-/5   Right Ankle Dorsiflexion 3+/5   Right Ankle Plantar Flexion 3+/5   Left Ankle Dorsiflexion 3+/5   Left Ankle Plantar Flexion 3+/5   Palpation   Patella mobility WFL   Palpation comment denies any tenderness to palpation of BLE knees;    Transfers   Comments unable to transfer without HHA pushing up due to weakness and B knee pain;    Ambulation/Gait   Gait Comments ambulates without AD with supervision, demonstrates narrow base of support, veering side/side with occasional cross step; Demonstrates fair foot clearance with occasional foot drag;    Standardized Balance Assessment   10 Meter Walk 0.95 m/s without AD (home ambulator, limited community ambulator)   Furniture conservator/restorer   Sit to Stand Able to stand  independently using  hands   Standing Unsupported Able to stand safely 2 minutes   Sitting with Back Unsupported but Feet Supported on Floor or Stool Able to sit safely and securely 2 minutes   Stand to Sit Controls descent by using hands   Transfers Able to transfer safely, definite need of hands   Standing Unsupported with Eyes Closed Able to stand 10 seconds with supervision   Standing Ubsupported with Feet Together Able to place feet together independently and stand for 1 minute with supervision   From Standing, Reach Forward with  Outstretched Arm Can reach confidently >25 cm (10")   From Standing Position, Pick up Object from [place] to pick up shoe safely and easily   From Standing Position, Turn to Look Behind Over each Shoulder Looks behind from both sides and weight shifts well   Turn 360 Degrees Able to turn 360 degrees safely but slowly   Standing Unsupported, Alternately Place Feet on Step/Stool Able to complete 4 steps without aid or supervision   Standing Unsupported, One Foot in [employer] balance while stepping or standing   Standing on One Leg Able to lift leg independently and hold equal to or more than 3 seconds   Total Score 41   Berg comment: 80% risk for falls;    High Level Balance   High Level Balance Comments SLS: 4 sec each LE; tandem stance <10 sec each LE     Initiated HEP: Forward/backward walking without HHA 10 feet x2 laps; SLS on firm surface with 1 HHA or less 10 sec hold x1 each LE; Tandem stance on firm surface with 1 HHA or less, 10 sec hold x1 each LE in front;  Patient required min VCs for balance stability, including to increase trunk control for less loss of balance with smaller base of support                        PT Education - 08/17/15 1501    Education provided Yes   Education Details HEP initiated, see patient instructions, findings;    Person(s) Educated Patient   Methods Explanation;Verbal cues;Handout   Comprehension Verbalized  understanding;Returned demonstration;Verbal cues required             PT Long Term Goals - 08/17/15 1509    PT LONG TERM GOAL #1   Title Patient will be independent in home exercise program to improve strength/mobility for better functional independence with ADLs. by 09/28/15   Time 6   Period Weeks   Status New   PT LONG TERM GOAL #2   Title Patient will increase Berg Balance score by > 6 points to demonstrate decreased fall risk during functional activities. by 09/28/15   Time 6   Period Weeks   Status New   PT LONG TERM GOAL #3   Title Patient will increase 10 meter walk test to >1.66m/s as to improve gait speed for better community ambulation and to reduce fall risk. by 09/28/15   Time 6   Period Weeks   Status New   PT LONG TERM GOAL #4   Title Patient will increase Functional Gait Assessment score to >20/30 as to reduce fall risk and improve dynamic gait safety with community ambulation. by 09/28/15   Time 6   Period Weeks   Status New   PT LONG TERM GOAL #5   Title Patient will increase BLE gross strength to 4+/5 as to improve functional strength for independent gait, increased standing tolerance and increased ADL ability. by 09/28/15   Time 6   Period Weeks   Status New               Plan - 08/17/15 1501    Clinical Impression Statement 82 yo female presents to therapy with imbalance. She reports increased difficulty with balance and decline in functional mobility over last 2 years. Patient has history of chronic knee pain with difficulty with sit<>Stand transfers. In addition she wears shoes with heels and without backs due to history of plantar fasciitis.  She also has poor vision and is waiting on cataract surgery in April 2017. All of these factors help contribute to imbalance. Patient also exhibits increased weakness in BLE. she would benefit from additional skilled PT Intervention to improve LE strength, balance and gait safety;    Pt will benefit from skilled  therapeutic intervention in order to improve on the following deficits Abnormal gait;Decreased endurance;Decreased activity tolerance;Decreased strength;Pain;Difficulty walking;Decreased mobility;Decreased balance;Improper body mechanics;Decreased safety awareness   Rehab Potential Fair   Clinical Impairments Affecting Rehab Potential positive: motivated, has home aide support; negative: fall history, co-morbidities; Patient's clinical presentation is evolving  as she tested as a high fall risk and has multiple co-morbidities (knee pain and poor vision) which affect her balance;    PT Frequency 2x / week   PT Duration 6 weeks   PT Treatment/Interventions ADLs/Self Care Home Management;Cryotherapy;Electrical Stimulation;Moist Heat;Balance training;Therapeutic exercise;Therapeutic activities;Functional mobility training;Stair training;Gait training;DME Instruction;Neuromuscular re-education;Patient/family education;Manual techniques;Taping;Energy conservation;Passive range of motion   PT Next Visit Plan advance HEP, work on ankle strengthening/balance;    PT Home Exercise Plan initiated- see patient instructions;    Consulted and Agree with Plan of Care Patient          G-Codes - 08-27-15 1512    Functional Assessment Tool Used 10 meter walk, Berg Balance Assessment, clinical judgement   Functional Limitation Mobility: Walking and moving around   Mobility: Walking and Moving Around Current Status [phone]) At least 40 percent but less than 60 percent impaired, limited or restricted   Mobility: Walking and Moving Around Goal Status [phone]) At least 1 percent but less than 20 percent impaired, limited or restricted       Problem List There are no active problems to display for this patient.   Trotter,Margaret PT, DPT 08-27-2015, 3:13 PM  [place] MAIN [facility] SERVICES [address] [place], [place], [address] Phone: [phone]   Fax:   [phone]  Name: Kalayna Darpino MRN: [id number] Date of Birth: [birth year]

## 2015-08-25 ENCOUNTER — Ambulatory Visit: Payer: Medicare Other | Admitting: Sports Medicine

## 2015-08-28 ENCOUNTER — Encounter: Payer: Self-pay | Admitting: Sports Medicine

## 2015-08-28 ENCOUNTER — Ambulatory Visit (INDEPENDENT_AMBULATORY_CARE_PROVIDER_SITE_OTHER): Payer: Medicare Other | Admitting: Sports Medicine

## 2015-08-28 DIAGNOSIS — M79676 Pain in unspecified toe(s): Secondary | ICD-10-CM

## 2015-08-28 DIAGNOSIS — I739 Peripheral vascular disease, unspecified: Secondary | ICD-10-CM

## 2015-08-28 DIAGNOSIS — B351 Tinea unguium: Secondary | ICD-10-CM | POA: Diagnosis not present

## 2015-08-28 NOTE — Progress Notes (Signed)
Patient ID: Christy Waters, female   DOB: May 30, 1919, 82 y.o.   MRN: [id number]   Subjective: Christy Waters is a 82 y.o. female patient seen today in office with complaint of painful thickened and elongated toenails; unable to trim. Patient denies history of Diabetes or Neuropathy. Admits to a significant history of varicosities and new left knee swelling of which she is to see orthopedic doctor for as scheduled. Patient has no other pedal complaints at this time.   Patient is assisted by daughter at this visit.  Patient Active Problem List   Diagnosis Date Noted  . 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 Irritable bowel syndrome with diarrhea 05/20/2014  . Abdominal pain, generalized 04/01/2014  . Atherosclerosis of aorta ([place]) 03/05/2014  . 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 Idiopathic peripheral neuropathy ([place]) 03/05/2014  . Abdominal pain of unknown cause 02/06/2014  . MI (mitral incompetence) 02/06/2014  . Stress incontinence 02/06/2014  . Fibrosis of lung ([place]) 10/03/2013  . Generalized OA 10/03/2013  . HLD (hyperlipidemia) 10/03/2013  . BP (high blood pressure) 10/03/2013  . OP (osteoporosis) 10/03/2013  . Beat, premature ventricular 10/03/2013    Current Outpatient Prescriptions on File Prior to Visit  Medication Sig Dispense Refill  . cetirizine (ZYRTEC) 10 MG tablet     . chlordiazePOXIDE (LIBRIUM) 10 MG capsule Take 10 mg by mouth 3 (three) times daily as needed for anxiety.    . Cholecalciferol (VITAMIN D-3 PO) Take by mouth.    . hydrochlorothiazide (MICROZIDE) 12.5 MG capsule Take 12.5 mg by mouth daily.    Marland Kitchen HYDROcodone-acetaminophen (NORCO/VICODIN) 5-325 MG tablet Take 1 tablet by mouth every 6 (six) hours as needed for moderate pain.    Marland Kitchen LUMIGAN 0.01 % SOLN     . metoprolol succinate (TOPROL-XL) 25 MG 24 hr tablet     . PARoxetine (PAXIL) 10 MG tablet Take 10 mg by mouth daily.     No current facility-administered medications on file prior to  visit.    Allergies  Allergen Reactions  . Azithromycin Shortness Of Breath and Other (See Comments)    Syncope Can not stand up, feels very sick, weak  . Clindamycin Other (See Comments)    GI problem   . Clindamycin/Lincomycin   . Codeine Other (See Comments) and Nausea Only    Altered mental status FATIGUE, SICK FEELING  . Hyoscyamine Diarrhea  . Penicillins Rash    Objective: Physical Exam  General: Well developed, nourished, no acute distress, awake, alert and oriented x 3  Vascular: Dorsalis pedis artery 2/4 bilateral, Posterior tibial artery 1/4 bilateral, skin temperature warm to warm proximal to distal bilateral lower extremities, Significant varicosities, no pedal hair present bilateral.  Neurological: Gross sensation present via light touch bilateral.   Dermatological: Skin is warm, dry, and supple bilateral, Nails 1-10 are tender, long, thick, and discolored with mild subungal debris, no webspace macerations present bilateral, no open lesions present bilateral, no callus/corns/hyperkeratotic tissue present bilateral. No signs of infection bilateral.  Musculoskeletal: Mild asymptomatic hammertoe boney deformities noted bilateral. Muscular strength within normal limits without painon range of motion. No pain with calf compression bilateral.  Assessment and Plan:  Problem List Items Addressed This Visit    None    Visit Diagnoses    Dermatophytosis of nail    -  Primary    Pain of toe, unspecified laterality        PVD (peripheral vascular disease) ([place])          -Examined  patient.  -Discussed treatment options for painful mycotic nails. -Mechanically debrided and reduced mycotic nails with sterile nail nipper and dremel nail file without incident. -Recommend good supportive shoes daily for foot type. -Patient to return in 3 months for follow up evaluation or sooner if symptoms worsen.  Landis Martins, DPM

## 2015-09-03 ENCOUNTER — Encounter: Payer: Self-pay | Admitting: Physical Therapy

## 2015-09-03 ENCOUNTER — Ambulatory Visit: Payer: Medicare Other | Attending: Internal Medicine | Admitting: Physical Therapy

## 2015-09-03 DIAGNOSIS — R531 Weakness: Secondary | ICD-10-CM | POA: Diagnosis present

## 2015-09-03 DIAGNOSIS — R269 Unspecified abnormalities of gait and mobility: Secondary | ICD-10-CM

## 2015-09-03 NOTE — Therapy (Signed)
[place] MAIN [facility] SERVICES [address] [place], [place], [address] Phone: [phone]   Fax:  [phone]  Physical Therapy Treatment  Patient Details  Name: Christy Waters MRN: [id number] Date of Birth: 1919/03/01 Referring Provider: Dr. Rusty Aus  Encounter Date: 09/03/2015      PT End of Session - 09/03/15 1403    Visit Number 2   Number of Visits 17   Date for PT Re-Evaluation 09/28/15   Authorization Type gcode 2   Authorization Time Period 10   PT Start Time 1343   PT Stop Time 1430   PT Time Calculation (min) 47 min   Equipment Utilized During Treatment Gait belt   Activity Tolerance Patient tolerated treatment well;No increased pain   Behavior During Therapy [facility] for tasks assessed/performed      Past Medical History  Diagnosis Date  . Cancer ([place])     skin cancer; some spots on left side of face; been 5 years since last treated;   . High cholesterol     Past Surgical History  Procedure Laterality Date  . Eye surgery Right     cataract surgery schedule for Easter Monday 2017;     There were no vitals filed for this visit.  Visit Diagnosis:  Abnormality of gait  Weakness      Subjective Assessment - 09/03/15 1352    Subjective Patient reports trying exercise at home but had a hard time. She reports still having increased heel pain with walking in flatter shoes. She reports needing a heel wiht longer distance walking. Patient reports walking around barefoot at home without pain but then needs a taller shoe when walking long distances;    Pertinent History personal factors affecting rehab: poor vision, getting eye surgery in April 2017; poor footwear (wears shoes with heels, due to history of plantar fasciits); history of fall, chronic knee pain;    Limitations Standing;Walking;Reading   How long can you sit comfortably? >2 hours   How long can you stand comfortably? 30-60 min;   How long can you walk  comfortably? about 500 feet; She reports having trouble walking without holding onto something;    Patient Stated Goals reduce fall risk; feel less afraid of falling; gain strength in legs;    Currently in Pain? No/denies   Pain Onset More than a month ago          TREATMENT:  Warm up on Nustep level 2 BUE/BLE x4 min (unbilled);  Advanced HEP with instruction in LE strengthening: Standing with red tband around both legs: Hip abduction x10 bilaterally with min VCs to avoid trunk lean for better safety; Hip extension x10 bilaterally with min VCs to avoid forward lean and to keep knee straight for better glute squeeze; Side stepping 10 feet x2 laps each direction with min VCs to increase step length for better strengthening; She had increased difficulty due to weakness;   Seated with red tband around feet: Alternate toe taps x10 bilaterally; BLE ankle eversion x10;  Patient had difficulty avoiding LE movement during ankle movement requiring cues to isolate just ankle ROM for strengthening;  Standing on airex x2: Feet apart, eyes open/closed 10 sec hold x3 each with moderate verbal and visual cues for balance control and weight shift; Patient did require min A for balance; BUE ball pass side/side x10 each;  Resisted weighted gait, 7.5# forward/ backward, side/side x2 laps each direction, Patient required min A for balance and mod VCs to  slow down eccentric return for better balance control; She required mod Vcs to improve weight shift for increased stance control;   Patient required min VCs for balance stability, including to increase trunk control for less loss of balance with smaller base of support                          PT Education - 09/03/15 1402    Education provided Yes   Education Details HEP advanced with LE strengthening   Person(s) Educated Patient   Methods Explanation;Verbal cues;Handout   Comprehension Verbalized understanding;Returned  demonstration;Verbal cues required             PT Long Term Goals - 08/17/15 1509    PT LONG TERM GOAL #1   Title Patient will be independent in home exercise program to improve strength/mobility for better functional independence with ADLs. by 09/28/15   Time 6   Period Weeks   Status New   PT LONG TERM GOAL #2   Title Patient will increase Berg Balance score by > 6 points to demonstrate decreased fall risk during functional activities. by 09/28/15   Time 6   Period Weeks   Status New   PT LONG TERM GOAL #3   Title Patient will increase 10 meter walk test to >1.26m/s as to improve gait speed for better community ambulation and to reduce fall risk. by 09/28/15   Time 6   Period Weeks   Status New   PT LONG TERM GOAL #4   Title Patient will increase Functional Gait Assessment score to >20/30 as to reduce fall risk and improve dynamic gait safety with community ambulation. by 09/28/15   Time 6   Period Weeks   Status New   PT LONG TERM GOAL #5   Title Patient will increase BLE gross strength to 4+/5 as to improve functional strength for independent gait, increased standing tolerance and increased ADL ability. by 09/28/15   Time 6   Period Weeks   Status New               Plan - 09/03/15 1430    Clinical Impression Statement Patient instructed in LE strengthening as part of HEP; She required increased cues to increase ROM for better strengthening. Patient does have difficulty with advanced exercise with ankle instability due to poor footwear. PT educated patient about better footwear including to have a back to the shoe and for the shoe to be more flat for better safety; Patient would benefit from additional skilled PT Intervention to improve LE strength, balance and gait safety;    Pt will benefit from skilled therapeutic intervention in order to improve on the following deficits Abnormal gait;Decreased endurance;Decreased activity tolerance;Decreased strength;Pain;Difficulty  walking;Decreased mobility;Decreased balance;Improper body mechanics;Decreased safety awareness   Rehab Potential Fair   Clinical Impairments Affecting Rehab Potential positive: motivated, has home aide support; negative: fall history, co-morbidities; Patient's clinical presentation is evolving  as she tested as a high fall risk and has multiple co-morbidities (knee pain and poor vision) which affect her balance;    PT Frequency 2x / week   PT Duration 6 weeks   PT Treatment/Interventions ADLs/Self Care Home Management;Cryotherapy;Electrical Stimulation;Moist Heat;Balance training;Therapeutic exercise;Therapeutic activities;Functional mobility training;Stair training;Gait training;DME Instruction;Neuromuscular re-education;Patient/family education;Manual techniques;Taping;Energy conservation;Passive range of motion   PT Next Visit Plan advance HEP, work on ankle strengthening/balance;    PT Home Exercise Plan advanced  see patient instructions;    Consulted and Agree with  Plan of Care Patient        Problem List Patient Active Problem List   Diagnosis Date Noted  . 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 Irritable bowel syndrome with diarrhea 05/20/2014  . Abdominal pain, generalized 04/01/2014  . Atherosclerosis of aorta ([place]) 03/05/2014  . 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 Idiopathic peripheral neuropathy ([place]) 03/05/2014  . Abdominal pain of unknown cause 02/06/2014  . MI (mitral incompetence) 02/06/2014  . Stress incontinence 02/06/2014  . Fibrosis of lung ([place]) 10/03/2013  . Generalized OA 10/03/2013  . HLD (hyperlipidemia) 10/03/2013  . BP (high blood pressure) 10/03/2013  . OP (osteoporosis) 10/03/2013  . Beat, premature ventricular 10/03/2013    Trotter,Margaret PT, DPT 09/03/2015, 5:07 PM  [place] MAIN [facility] SERVICES [address] [place], [place], [address] Phone: [phone]   Fax:  [phone]  Name: Christy Waters MRN: [id number] Date of Birth: 01/29/1919

## 2015-09-03 NOTE — Patient Instructions (Addendum)
  Balance, Proprioception: Hip Abduction With Tubing   With tubing attached to both ankles, Standing holding onto counter, kick one leg out to side and then Return.  Repeat _10___ times  On each side.  Do ___2_ sessions per day.  http://cc.exer.us/20   Copyright  VHI. All rights reserved.    Copyright  VHI. All rights reserved.  Balance, Proprioception: Hip Extension With Tubing   With tubing tied around both legs, holding onto kitchen counter, swing leg back. Return. Repeat _10___ times . Do __2__ sessions per day.  http://cc.exer.us/19    Copyright  VHI. All rights reserved.  Band Walk: Side Stepping   Tie band around legs, around ankles. Step _10__ feet to one side, then step back to start. Repeat _2-3__ feet per session. Note: Small towel between band and skin eases rubbing.  http://plyo.exer.us/76     Copyright  VHI. All rights reserved.  FLEXION: Sitting - Resistance Band (Active)   Sit with right foot flat. Have band tied around both feet, bend ankle, bringing toes toward head. Complete __2_ sets of __10_ repetitions. Perform _2__ sessions per day.   Eversion: Resisted    Have band tied around both feet towards toes, lift both toes up and try to turn feet outward, keeping heels on the ground.  Repeat _10___ times per set. Do __2__ sets per session. Do _2___ sessions per day.  http://orth.exer.us/15   Copyright  VHI. All rights reserved.

## 2015-09-09 ENCOUNTER — Encounter: Payer: Self-pay | Admitting: *Deleted

## 2015-09-10 ENCOUNTER — Encounter: Payer: Medicare Other | Admitting: Physical Therapy

## 2015-09-10 NOTE — Discharge Instructions (Signed)

## 2015-09-14 ENCOUNTER — Ambulatory Visit: Payer: Medicare Other | Admitting: Anesthesiology

## 2015-09-14 ENCOUNTER — Ambulatory Visit
Admission: RE | Admit: 2015-09-14 | Discharge: 2015-09-14 | Disposition: A | Discharge status: Home / Self Care | Payer: Medicare Other | Source: Ambulatory Visit | Attending: Ophthalmology | Admitting: Ophthalmology

## 2015-09-14 ENCOUNTER — Encounter
Admission: RE | Disposition: A | Discharge status: Home / Self Care | Payer: Self-pay | Source: Ambulatory Visit | Attending: Ophthalmology

## 2015-09-14 DIAGNOSIS — Z885 Allergy status to narcotic agent status: Secondary | ICD-10-CM | POA: Insufficient documentation

## 2015-09-14 DIAGNOSIS — I739 Peripheral vascular disease, unspecified: Secondary | ICD-10-CM | POA: Insufficient documentation

## 2015-09-14 DIAGNOSIS — H2512 Age-related nuclear cataract, left eye: Secondary | ICD-10-CM | POA: Insufficient documentation

## 2015-09-14 DIAGNOSIS — F329 Major depressive disorder, single episode, unspecified: Secondary | ICD-10-CM | POA: Diagnosis not present

## 2015-09-14 DIAGNOSIS — M1990 Unspecified osteoarthritis, unspecified site: Secondary | ICD-10-CM | POA: Insufficient documentation

## 2015-09-14 DIAGNOSIS — I340 Nonrheumatic mitral (valve) insufficiency: Secondary | ICD-10-CM | POA: Insufficient documentation

## 2015-09-14 DIAGNOSIS — Z85828 Personal history of other malignant neoplasm of skin: Secondary | ICD-10-CM | POA: Insufficient documentation

## 2015-09-14 DIAGNOSIS — I10 Essential (primary) hypertension: Secondary | ICD-10-CM | POA: Insufficient documentation

## 2015-09-14 DIAGNOSIS — Z888 Allergy status to other drugs, medicaments and biological substances status: Secondary | ICD-10-CM | POA: Insufficient documentation

## 2015-09-14 DIAGNOSIS — E7800 Pure hypercholesterolemia, unspecified: Secondary | ICD-10-CM | POA: Diagnosis not present

## 2015-09-14 DIAGNOSIS — I499 Cardiac arrhythmia, unspecified: Secondary | ICD-10-CM | POA: Insufficient documentation

## 2015-09-14 DIAGNOSIS — Z881 Allergy status to other antibiotic agents status: Secondary | ICD-10-CM | POA: Insufficient documentation

## 2015-09-14 DIAGNOSIS — R011 Cardiac murmur, unspecified: Secondary | ICD-10-CM | POA: Diagnosis not present

## 2015-09-14 DIAGNOSIS — Z880 Allergy status to penicillin: Secondary | ICD-10-CM | POA: Insufficient documentation

## 2015-09-14 DIAGNOSIS — M810 Age-related osteoporosis without current pathological fracture: Secondary | ICD-10-CM | POA: Insufficient documentation

## 2015-09-14 HISTORY — DX: Anemia, unspecified: D64.9

## 2015-09-14 HISTORY — DX: Unspecified osteoarthritis, unspecified site: M19.90

## 2015-09-14 HISTORY — PX: CATARACT EXTRACTION W/PHACO: SHX586

## 2015-09-14 HISTORY — DX: Age-related osteoporosis without current pathological fracture: M81.0

## 2015-09-14 HISTORY — DX: Headache: R51

## 2015-09-14 HISTORY — DX: Headache, unspecified: R51.9

## 2015-09-14 SURGERY — PHACOEMULSIFICATION, CATARACT, WITH IOL INSERTION
Anesthesia: Monitor Anesthesia Care | Site: Eye | Laterality: Left | Wound class: Clean

## 2015-09-14 MED ORDER — NA HYALUR & NA CHOND-NA HYALUR 0.4-0.35 ML IO KIT
PACK | INTRAOCULAR | Status: DC | PRN
Start: 2015-09-14 — End: 2015-09-14
  Administered 2015-09-14: 1 mL via INTRAOCULAR

## 2015-09-14 MED ORDER — TETRACAINE HCL 0.5 % OP SOLN
1.0000 [drp] | OPHTHALMIC | Status: DC | PRN
  Administered 2015-09-14: 1 [drp] via OPHTHALMIC

## 2015-09-14 MED ORDER — MIDAZOLAM HCL 2 MG/2ML IJ SOLN
INTRAMUSCULAR | Status: DC | PRN
  Administered 2015-09-14: 2 mg via INTRAVENOUS

## 2015-09-14 MED ORDER — LACTATED RINGERS IV SOLN: INTRAVENOUS | Status: DC

## 2015-09-14 MED ORDER — MOXIFLOXACIN HCL 0.5 % OP SOLN
OPHTHALMIC | Status: DC | PRN
  Administered 2015-09-14: 1 [drp] via OPHTHALMIC

## 2015-09-14 MED ORDER — LIDOCAINE HCL (PF) 4 % IJ SOLN
INTRAMUSCULAR | Status: DC | PRN
  Administered 2015-09-14: 1 mL via OPHTHALMIC

## 2015-09-14 MED ORDER — POVIDONE-IODINE 5 % OP SOLN
1.0000 "application " | OPHTHALMIC | Status: DC | PRN
  Administered 2015-09-14: 1 via OPHTHALMIC

## 2015-09-14 MED ORDER — TRYPAN BLUE 0.06 % OP SOLN
OPHTHALMIC | Status: DC | PRN
  Administered 2015-09-14: .5 mL via INTRAOCULAR

## 2015-09-14 MED ORDER — EPINEPHRINE HCL 1 MG/ML IJ SOLN
INTRAOCULAR | Status: DC | PRN
  Administered 2015-09-14: 10:00:00 via OPHTHALMIC
  Administered 2015-09-14: 102 mL via OPHTHALMIC

## 2015-09-14 MED ORDER — FENTANYL CITRATE (PF) 100 MCG/2ML IJ SOLN
INTRAMUSCULAR | Status: DC | PRN
  Administered 2015-09-14: 50 ug via INTRAVENOUS

## 2015-09-14 MED ORDER — ARMC OPHTHALMIC DILATING GEL
1.0000 "application " | OPHTHALMIC | Status: DC | PRN
  Administered 2015-09-14: 1 via OPHTHALMIC

## 2015-09-14 MED ORDER — BRIMONIDINE TARTRATE 0.2 % OP SOLN
OPHTHALMIC | Status: DC | PRN
  Administered 2015-09-14: 1 [drp] via OPHTHALMIC

## 2015-09-14 SURGICAL SUPPLY — 28 items

## 2015-09-14 NOTE — Anesthesia Procedure Notes (Signed)
Procedure Name: MAC Performed by: Brynja Marker Pre-anesthesia Checklist: Patient identified, Emergency Drugs available, Suction available, Timeout performed and Patient being monitored Patient Re-evaluated:Patient Re-evaluated prior to inductionOxygen Delivery Method: Nasal cannula Placement Confirmation: positive ETCO2     

## 2015-09-14 NOTE — Anesthesia Postprocedure Evaluation (Signed)
Anesthesia Post Note  Patient: Christy Waters  Procedure(s) Performed: Procedure(s) (LRB): CATARACT EXTRACTION PHACO AND INTRAOCULAR LENS PLACEMENT (I[facility]) (Left)  Patient location during evaluation: PACU Anesthesia Type: MAC Level of consciousness: awake and alert Pain management: pain level controlled Vital Signs Assessment: post-procedure vital signs reviewed and stable Respiratory status: spontaneous breathing, nonlabored ventilation, respiratory function stable and patient connected to nasal cannula oxygen Cardiovascular status: stable and blood pressure returned to baseline Anesthetic complications: no    Donnelle Rubey C

## 2015-09-14 NOTE — H&P (Signed)
H+P reviewed and is up to date, please see paper chart.  

## 2015-09-14 NOTE — Op Note (Signed)
Date of Surgery: 09/14/2015  PREOPERATIVE DIAGNOSES: Visually significant nuclear sclerotic cataract, left eye.  POSTOPERATIVE DIAGNOSES: Same  PROCEDURES PERFORMED: Cataract extraction with intraocular lens implant, left eye with aid of vision blue and Malyugin ring.  SURGEON: Almon Hercules, M.D.  ANESTHESIA: MAC and topical  IMPLANTS: [id number] +21.0 D   Implant Name Type Inv. Item Serial No. Manufacturer Lot No. LRB No. Used  acrysofIQ ASPHERIC PRE-LOADED LENS     [id number] ALCON   Left 1    COMPLICATIONS: None.  DESCRIPTION OF PROCEDURE: Therapeutic options were discussed with the patient preoperatively, including a discussion of risks and benefits of surgery. Informed consent was obtained. An IOL-Master and immersion biometry were used to take the lens measurements, and a dilated fundus exam was performed within 6 months of the surgical date.  The patient was premedicated and brought to the operating room and placed on the operating table in the supine position. After adequate anesthesia, the patient was prepped and draped in the usual sterile ophthalmic fashion. A wire lid speculum was inserted and the microscope was positioned. A Superblade was used to create a paracentesis site at the limbus and a small amount of vision blue dye was put into the anterior chamber, after which dilute preservative free lidocaine was instilled into the anterior chamber, followed by dispersive viscoelastic. A clear corneal incision was created temporally using a 2.4 mm keratome blade. A Maluygin ring was placed.  Capsulorrhexis was then performed. In situ phacoemulsification was performed.  Cortical material was removed with the irrigation-aspiration unit. Dispersive viscoelastic was instilled to open the capsular bag. A posterior chamber intraocular lens with the specifications above was inserted and positioned. The Malyugin ring was removed.  Irrigation-aspiration was used to remove all viscoelastic.  Vigamox 1cc was instilled into the anterior chamber, and the corneal incision was checked and found to be water tight. The eyelid speculum was removed.  The operative eye was covered with protective goggles after instilling 1 drop of brimonidine. The patient tolerated the procedure well. There were no complications.

## 2015-09-14 NOTE — Transfer of Care (Signed)
Immediate Anesthesia Transfer of Care Note  Patient: Christy Waters  Procedure(s) Performed: Procedure(s) with comments: CATARACT EXTRACTION PHACO AND INTRAOCULAR LENS PLACEMENT ([place]) (Left) - MALYUGIN  Patient Location: PACU  Anesthesia Type: MAC  Level of Consciousness: awake, alert  and patient cooperative  Airway and Oxygen Therapy: Patient Spontanous Breathing and Patient connected to supplemental oxygen  Post-op Assessment: Post-op Vital signs reviewed, Patient's Cardiovascular Status Stable, Respiratory Function Stable, Patent Airway and No signs of Nausea or vomiting  Post-op Vital Signs: Reviewed and stable  Complications: No apparent anesthesia complications

## 2015-09-14 NOTE — Anesthesia Preprocedure Evaluation (Signed)
Anesthesia Evaluation    Airway Mallampati: II  TM Distance: >3 FB Neck ROM: Full    Dental no notable dental hx.    Pulmonary neg pulmonary ROS,    Pulmonary exam normal breath sounds clear to auscultation       Cardiovascular hypertension, + Peripheral Vascular Disease  Normal cardiovascular exam+ dysrhythmias + Valvular Problems/Murmurs MR  Rhythm:Regular Rate:Normal     Neuro/Psych  Headaches,  Neuromuscular disease    GI/Hepatic negative GI ROS, Neg liver ROS,   Endo/Other    Renal/GU negative Renal ROS  negative genitourinary   Musculoskeletal  (+) Arthritis ,   Abdominal   Peds  Hematology  (+) anemia ,   Anesthesia Other Findings   Reproductive/Obstetrics                             Anesthesia Physical Anesthesia Plan  ASA: III  Anesthesia Plan: MAC   Post-op Pain Management:    Induction: Intravenous  Airway Management Planned:   Additional Equipment:   Intra-op Plan:   Post-operative Plan: Extubation in OR  Informed Consent: I have reviewed the patients History and Physical, chart, labs and discussed the procedure including the risks, benefits and alternatives for the proposed anesthesia with the patient or authorized representative who has indicated his/her understanding and acceptance.   Dental advisory given  Plan Discussed with: CRNA  Anesthesia Plan Comments:         Anesthesia Quick Evaluation

## 2015-09-15 ENCOUNTER — Encounter: Payer: Medicare Other | Admitting: Physical Therapy

## 2015-09-15 ENCOUNTER — Encounter: Payer: Self-pay | Admitting: Ophthalmology

## 2015-09-16 ENCOUNTER — Encounter: Payer: Self-pay | Admitting: Physical Therapy

## 2015-09-16 NOTE — Therapy (Signed)
[place] [facility] [address] [place], [place], [address] Phone: [phone]   Fax:  [phone]  September 16, 2015   @[email or web address] @  Physical Therapy Discharge Summary  Patient: Christy Waters  MRN: [id number]  Date of Birth: [birth year]   Diagnosis: No diagnosis found. Referring Provider: Dr. Rusty Aus  The above patient had been seen in Physical Therapy 2 times of 8 treatments scheduled with 0 no shows and 6 cancellations.  The treatment consisted of balance exercise, LE strengthening The patient is: Unchanged  Subjective: Patient called and cancelled remaining appointments because she had eye surgery and is not able to continue with PT at this time. Unable to assess goals or obtain objective outcomes as patient did not return for a final visit; Last visit was 09/03/15      Sincerely,   Trotter,Margaret, PT, DPT   CC @[email or web address] @  [place] [address] [place], [place], [address] Phone: [phone]   Fax:  [phone]  Patient: Christy Waters  MRN: [id number]  Date of Birth: 1919/04/04

## 2015-09-17 ENCOUNTER — Encounter: Payer: Medicare Other | Admitting: Physical Therapy

## 2015-09-22 ENCOUNTER — Encounter: Payer: Medicare Other | Admitting: Physical Therapy

## 2015-09-24 ENCOUNTER — Encounter: Payer: Medicare Other | Admitting: Physical Therapy

## 2015-09-29 ENCOUNTER — Encounter: Payer: Medicare Other | Admitting: Physical Therapy

## 2015-10-01 ENCOUNTER — Encounter: Payer: Medicare Other | Admitting: Physical Therapy

## 2015-11-17 ENCOUNTER — Encounter: Payer: Self-pay | Admitting: Sports Medicine

## 2015-11-17 ENCOUNTER — Ambulatory Visit (INDEPENDENT_AMBULATORY_CARE_PROVIDER_SITE_OTHER): Payer: Medicare Other | Admitting: Sports Medicine

## 2015-11-17 DIAGNOSIS — B351 Tinea unguium: Secondary | ICD-10-CM | POA: Diagnosis not present

## 2015-11-17 DIAGNOSIS — M79676 Pain in unspecified toe(s): Secondary | ICD-10-CM

## 2015-11-17 DIAGNOSIS — I739 Peripheral vascular disease, unspecified: Secondary | ICD-10-CM

## 2015-11-17 NOTE — Progress Notes (Signed)
Patient ID: Christy Waters, female   DOB: July 03, 1918, 82 y.o.   MRN: [id number]  Subjective: Christy Waters is a 82 y.o. female patient seen today in office with complaint of painful thickened and elongated toenails; unable to trim. Patient denies any changes since last visit. Patient has no other pedal complaints at this time.   Patient is assisted by friend at this visit.  Patient Active Problem List   Diagnosis Date Noted  . 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 Irritable bowel syndrome with diarrhea 05/20/2014  . Abdominal pain, generalized 04/01/2014  . Atherosclerosis of aorta ([place]) 03/05/2014  . 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 Idiopathic peripheral neuropathy ([place]) 03/05/2014  . Abdominal pain of unknown cause 02/06/2014  . MI (mitral incompetence) 02/06/2014  . Stress incontinence 02/06/2014  . Fibrosis of lung ([place]) 10/03/2013  . Generalized OA 10/03/2013  . HLD (hyperlipidemia) 10/03/2013  . BP (high blood pressure) 10/03/2013  . OP (osteoporosis) 10/03/2013  . Beat, premature ventricular 10/03/2013    Current Outpatient Prescriptions on File Prior to Visit  Medication Sig Dispense Refill  . carboxymethylcellulose (REFRESH PLUS) 0.5 % SOLN Apply to eye.    . cetirizine (ZYRTEC) 10 MG tablet as needed.     . chlordiazePOXIDE (LIBRIUM) 10 MG capsule Take 10 mg by mouth 3 (three) times daily as needed for anxiety.    . Cholecalciferol (VITAMIN D-3 PO) Take by mouth.    . cyanocobalamin (V-R VITAMIN B-12) 500 MCG tablet Take by mouth.    . hydrochlorothiazide (MICROZIDE) 12.5 MG capsule Take 12.5 mg by mouth daily. Reported on 09/14/2015    . HYDROcodone-acetaminophen (NORCO/VICODIN) 5-325 MG tablet Take 1 tablet by mouth every 6 (six) hours as needed for moderate pain.    Marland Kitchen LUMIGAN 0.01 % SOLN     . PARoxetine (PAXIL) 10 MG tablet Take 10 mg by mouth daily.    . ranitidine (ZANTAC) 150 MG tablet Take 150 mg by mouth as needed.      No current  facility-administered medications on file prior to visit.    Allergies  Allergen Reactions  . Azithromycin Shortness Of Breath and Other (See Comments)    Syncope Can not stand up, feels very sick, weak  . Clindamycin Other (See Comments)    GI problem   . Clindamycin/Lincomycin   . Codeine Other (See Comments) and Nausea Only    Altered mental status FATIGUE, SICK FEELING  . Hyoscyamine Diarrhea  . Maxitrol [Neomycin-Polymyxin-Dexameth]     Eye irritation  . Timolol     Eye irritation  . Penicillins Rash    Objective: Physical Exam  General: Well developed, nourished, no acute distress, awake, alert and oriented x 3  Vascular: Dorsalis pedis artery 2/4 bilateral, Posterior tibial artery 1/4 bilateral, skin temperature warm to warm proximal to distal bilateral lower extremities, Significant varicosities, no pedal hair present bilateral.  Neurological: Gross sensation present via light touch bilateral.   Dermatological: Skin is warm, dry, and supple bilateral, Nails 1-10 are tender, long, thick, and discolored with mild subungal debris, no webspace macerations present bilateral, no open lesions present bilateral, no callus/corns/hyperkeratotic tissue present bilateral. No signs of infection bilateral.  Musculoskeletal: Mild asymptomatic hammertoe boney deformities noted bilateral. Muscular strength within normal limits without painon range of motion. No pain with calf compression bilateral.  Assessment and Plan:  Problem List Items Addressed This Visit    None    Visit Diagnoses    Dermatophytosis of nail    -  Primary    Pain of toe, unspecified laterality        PVD (peripheral vascular disease) ([place])          -Examined patient.  -Discussed treatment options for painful mycotic nails. -Mechanically debrided and reduced mycotic nails with sterile nail nipper and dremel nail file without incident. -Recommend good supportive shoes daily for foot type. -Patient to return  in 3 months for follow up evaluation or sooner if symptoms worsen.  Landis Martins, DPM

## 2016-02-16 ENCOUNTER — Ambulatory Visit: Payer: Medicare Other | Admitting: Podiatry

## 2016-02-23 ENCOUNTER — Encounter: Payer: Self-pay | Admitting: Podiatry

## 2016-02-23 ENCOUNTER — Ambulatory Visit: Payer: Medicare Other | Admitting: Sports Medicine

## 2016-02-23 ENCOUNTER — Ambulatory Visit: Payer: Medicare Other | Admitting: Podiatry

## 2016-02-23 ENCOUNTER — Ambulatory Visit (INDEPENDENT_AMBULATORY_CARE_PROVIDER_SITE_OTHER): Payer: Medicare Other | Admitting: Podiatry

## 2016-02-23 DIAGNOSIS — L608 Other nail disorders: Secondary | ICD-10-CM

## 2016-02-23 DIAGNOSIS — L609 Nail disorder, unspecified: Secondary | ICD-10-CM

## 2016-02-23 DIAGNOSIS — M79676 Pain in unspecified toe(s): Secondary | ICD-10-CM

## 2016-02-23 DIAGNOSIS — M79609 Pain in unspecified limb: Principal | ICD-10-CM

## 2016-02-23 DIAGNOSIS — L603 Nail dystrophy: Secondary | ICD-10-CM

## 2016-02-23 DIAGNOSIS — B351 Tinea unguium: Secondary | ICD-10-CM | POA: Diagnosis not present

## 2016-02-23 NOTE — Progress Notes (Signed)
SUBJECTIVE Patient  presents to office today complaining of elongated, thickened nails. Pain while ambulating in shoes. Patient is unable to trim their own nails.   OBJECTIVE General Patient is awake, alert, and oriented x 3 and in no acute distress. Derm Skin is dry and supple bilateral. Negative open lesions or macerations. Remaining integument unremarkable. Nails are tender, long, thickened and dystrophic with subungual debris, consistent with onychomycosis, 1-5 bilateral. No signs of infection noted. Vasc  DP and PT pedal pulses palpable bilaterally. Temperature gradient within normal limits.  Neuro Epicritic and protective threshold sensation diminished bilaterally.  Musculoskeletal Exam No symptomatic pedal deformities noted bilateral. Muscular strength within normal limits.  ASSESSMENT 1. Onychodystrophic nails 1-5 bilateral with hyperkeratosis of nails.  2. Onychomycosis of nail due to dermatophyte bilateral 3. Pain in foot bilateral  PLAN OF CARE 1. Patient evaluated today.  2. Instructed to maintain good pedal hygiene and foot care.  3. Mechanical debridement of nails 1-5 bilaterally performed using a nail nipper. Filed with dremel without incident.  4. Return to clinic in 3 mos.    Christy Waters M Yaw Escoto, DPM    

## 2016-04-13 DIAGNOSIS — M818 Other osteoporosis without current pathological fracture: Secondary | ICD-10-CM | POA: Insufficient documentation

## 2016-04-26 ENCOUNTER — Encounter: Payer: Self-pay | Admitting: Podiatry

## 2016-04-26 ENCOUNTER — Ambulatory Visit (INDEPENDENT_AMBULATORY_CARE_PROVIDER_SITE_OTHER): Payer: Medicare Other | Admitting: Podiatry

## 2016-04-26 DIAGNOSIS — L603 Nail dystrophy: Secondary | ICD-10-CM

## 2016-04-26 DIAGNOSIS — B351 Tinea unguium: Secondary | ICD-10-CM

## 2016-04-26 DIAGNOSIS — M79609 Pain in unspecified limb: Secondary | ICD-10-CM | POA: Diagnosis not present

## 2016-04-26 DIAGNOSIS — L608 Other nail disorders: Secondary | ICD-10-CM

## 2016-04-26 NOTE — Progress Notes (Signed)

## 2016-05-23 ENCOUNTER — Emergency Department: Payer: Medicare Other

## 2016-05-23 ENCOUNTER — Emergency Department
Admission: EM | Admit: 2016-05-23 | Discharge: 2016-05-23 | Disposition: A | Discharge status: Home / Self Care | Payer: Medicare Other | Attending: Emergency Medicine | Admitting: Emergency Medicine

## 2016-05-23 ENCOUNTER — Encounter: Payer: Self-pay | Admitting: Emergency Medicine

## 2016-05-23 DIAGNOSIS — Z85828 Personal history of other malignant neoplasm of skin: Secondary | ICD-10-CM | POA: Insufficient documentation

## 2016-05-23 DIAGNOSIS — Z79899 Other long term (current) drug therapy: Secondary | ICD-10-CM | POA: Insufficient documentation

## 2016-05-23 DIAGNOSIS — N390 Urinary tract infection, site not specified: Secondary | ICD-10-CM | POA: Diagnosis not present

## 2016-05-23 DIAGNOSIS — E876 Hypokalemia: Secondary | ICD-10-CM | POA: Diagnosis not present

## 2016-05-23 DIAGNOSIS — R1084 Generalized abdominal pain: Secondary | ICD-10-CM | POA: Diagnosis present

## 2016-05-23 LAB — COMPREHENSIVE METABOLIC PANEL
ALK PHOS: 97 U/L (ref 38–126)
ALT: 32 U/L (ref 14–54)
AST: 50 U/L — AB (ref 15–41)
Albumin: 3.8 g/dL (ref 3.5–5.0)
Anion gap: 10 (ref 5–15)
BUN: 14 mg/dL (ref 6–20)
CHLORIDE: 102 mmol/L (ref 101–111)
CO2: 26 mmol/L (ref 22–32)
Calcium: 8.3 mg/dL — ABNORMAL LOW (ref 8.9–10.3)
Creatinine, Ser: 0.73 mg/dL (ref 0.44–1.00)
GFR calc non Af Amer: >60 mL/min (ref >60)
GLUCOSE: 123 mg/dL — AB (ref 65–99)
Potassium: 2.7 mmol/L — CL (ref 3.5–5.1)
SODIUM: 138 mmol/L (ref 135–145)
Total Bilirubin: 1.1 mg/dL (ref 0.3–1.2)
Total Protein: 6.9 g/dL (ref 6.5–8.1)

## 2016-05-23 LAB — URINALYSIS, COMPLETE (UACMP) WITH MICROSCOPIC
BILIRUBIN URINE: NEGATIVE
Glucose, UA: NEGATIVE mg/dL
KETONES UR: 20 mg/dL — AB
Nitrite: NEGATIVE
PROTEIN: 30 mg/dL — AB
Specific Gravity, Urine: 1.01 (ref 1.005–1.030)
pH: 7 (ref 5.0–8.0)

## 2016-05-23 LAB — CBC WITH DIFFERENTIAL/PLATELET
BASOS ABS: 0 10*3/uL (ref 0–0.1)
Basophils Relative: 0 %
EOS ABS: 0 10*3/uL (ref 0–0.7)
EOS PCT: 0 %
HCT: 36.3 % (ref 35.0–47.0)
HEMOGLOBIN: 12.5 g/dL (ref 12.0–16.0)
LYMPHS ABS: 0.4 10*3/uL — AB (ref 1.0–3.6)
LYMPHS PCT: 5 %
MCH: 33.7 pg (ref 26.0–34.0)
MCHC: 34.6 g/dL (ref 32.0–36.0)
MCV: 97.4 fL (ref 80.0–100.0)
Monocytes Absolute: 0.6 10*3/uL (ref 0.2–0.9)
Monocytes Relative: 8 %
NEUTROS PCT: 87 %
Neutro Abs: 6.4 10*3/uL (ref 1.4–6.5)
PLATELETS: 158 10*3/uL (ref 150–440)
RBC: 3.72 MIL/uL — AB (ref 3.80–5.20)
RDW: 14 % (ref 11.5–14.5)
WBC: 7.4 10*3/uL (ref 3.6–11.0)

## 2016-05-23 LAB — LIPASE, BLOOD: Lipase: 15 U/L (ref 11–51)

## 2016-05-23 MED ORDER — IOPAMIDOL (ISOVUE-300) INJECTION 61%
30.0000 mL | INTRAVENOUS | Status: AC
  Administered 2016-05-23: 30 mL via ORAL

## 2016-05-23 MED ORDER — POTASSIUM CHLORIDE CRYS ER 20 MEQ PO TBCR
20.0000 meq | EXTENDED_RELEASE_TABLET | Freq: Once | ORAL | Status: AC
  Administered 2016-05-23: 20 meq via ORAL
  Filled 2016-05-23: qty 1

## 2016-05-23 MED ORDER — ONDANSETRON HCL 4 MG/2ML IJ SOLN
4.0000 mg | Freq: Once | INTRAMUSCULAR | Status: AC
  Administered 2016-05-23: 4 mg via INTRAVENOUS

## 2016-05-23 MED ORDER — CEFTRIAXONE SODIUM-DEXTROSE 1-3.74 GM-% IV SOLR
1.0000 g | Freq: Once | INTRAVENOUS | Status: AC
  Administered 2016-05-23: 1 g via INTRAVENOUS

## 2016-05-23 MED ORDER — IOPAMIDOL (ISOVUE-300) INJECTION 61%: 15.0000 mL | INTRAVENOUS | Status: DC

## 2016-05-23 MED ORDER — DEXTROSE 5 % IV SOLN: 1.0000 g | Freq: Once | INTRAVENOUS | Status: DC

## 2016-05-23 MED ORDER — NITROFURANTOIN MACROCRYSTAL 100 MG PO CAPS: 100.0000 mg | ORAL_CAPSULE | Freq: Four times a day (QID) | ORAL | 0 refills | Status: AC

## 2016-05-23 MED ORDER — POTASSIUM CHLORIDE ER 20 MEQ PO TBCR: 20.0000 meq | EXTENDED_RELEASE_TABLET | Freq: Every day | ORAL | 0 refills | Status: DC

## 2016-05-23 MED ORDER — IOPAMIDOL (ISOVUE-300) INJECTION 61%
100.0000 mL | Freq: Once | INTRAVENOUS | Status: AC | PRN
  Administered 2016-05-23: 100 mL via INTRAVENOUS

## 2016-05-23 MED ORDER — CEFTRIAXONE SODIUM-DEXTROSE 1-3.74 GM-% IV SOLR
INTRAVENOUS | Status: AC
  Administered 2016-05-23: 1 g via INTRAVENOUS
  Filled 2016-05-23: qty 50

## 2016-05-23 MED ORDER — ONDANSETRON HCL 4 MG/2ML IJ SOLN
INTRAMUSCULAR | Status: AC
  Filled 2016-05-23: qty 2

## 2016-05-23 NOTE — ED Triage Notes (Signed)
Pt coming from home with abd pain x 5 days. With pain 7/10. Denies nausea and vomiting. Getting over UTI. States she might have had a few lose stools.

## 2016-05-23 NOTE — ED Notes (Signed)
Pt assisted to bathroom, resting in bed, daughter at bedside, CT notified of pt finishing contrast

## 2016-05-23 NOTE — ED Notes (Signed)
Critical Potassium 2.7 Dr. Owens Shark made aware.

## 2016-05-23 NOTE — ED Provider Notes (Signed)
[facility] Emergency Department Provider Note    First MD Initiated Contact with Patient 05/23/16 0543     (approximate)  I have reviewed the triage vital signs and the nursing notes.   HISTORY  Chief Complaint Abdominal Pain   HPI Christy Waters is a 82 y.o. female was below chronic medical conditions presents to the emergency department with 7 out of 10 generalized abdominal pain predominantly on the right side of her abdomen 5 days associated with nausea. She denies any fever or febrile on presentation. Patient denies any diarrhea or constipation.   Past Medical History:  Diagnosis Date  . Anemia    distant past  . Arrhythmia    followed by PCP  . Arthritis    fingers, hips  . Cancer ([place])    skin cancer; some spots on left side of face; been 5 years since last treated;   . Headache   . Heart murmur    followed by PCP  . High cholesterol   . Hypercholesteremia   . Osteoporosis   . Skin cancer     Patient Active Problem List   Diagnosis Date Noted  . 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 Irritable bowel syndrome with diarrhea 05/20/2014  . Abdominal pain, generalized 04/01/2014  . Atherosclerosis of aorta ([place]) 03/05/2014  . 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 Idiopathic peripheral neuropathy 03/05/2014  . Abdominal pain of unknown cause 02/06/2014  . MI (mitral incompetence) 02/06/2014  . Stress incontinence 02/06/2014  . Fibrosis of lung ([place]) 10/03/2013  . Generalized OA 10/03/2013  . HLD (hyperlipidemia) 10/03/2013  . BP (high blood pressure) 10/03/2013  . OP (osteoporosis) 10/03/2013  . Beat, premature ventricular 10/03/2013    Past Surgical History:  Procedure Laterality Date  . CATARACT EXTRACTION W/PHACO Left 09/14/2015   Procedure: CATARACT EXTRACTION PHACO AND INTRAOCULAR LENS PLACEMENT (IOC);  Surgeon: Ronnell Freshwater, MD;  Location: [place];  Service: Ophthalmology;  Laterality: Left;   [place] Right    cataract surgery schedule for Easter Monday 2017;     Prior to Admission medications   Medication Sig Start Date End Date Taking? Authorizing Provider  carboxymethylcellulose (REFRESH PLUS) 0.5 % SOLN Apply 1-2 drops to eye 2 (two) times daily as needed.    Yes Historical Provider, MD  chlordiazePOXIDE (LIBRIUM) 10 MG capsule Take 10 mg by mouth 3 (three) times daily as needed for anxiety.   Yes Historical Provider, MD  cholecalciferol (VITAMIN D) 1000 units tablet Take 2,000 Units by mouth daily.   Yes Historical Provider, MD  famotidine (PEPCID) 20 MG tablet Take 20 mg by mouth 2 (two) times daily.   Yes Historical Provider, MD  LUMIGAN 0.01 % SOLN Place 1 drop into both eyes at bedtime.    Yes Historical Provider, MD  methylphenidate (RITALIN) 5 MG tablet Take 5 mg by mouth 3 (three) times daily with meals.   Yes Historical Provider, MD  nepafenac (ILEVRO) 0.3 % ophthalmic suspension 1 drop daily.   Yes Historical Provider, MD  PARoxetine (PAXIL) 10 MG tablet Take 15 mg by mouth daily.    Yes Historical Provider, MD  vitamin B-12 (CYANOCOBALAMIN) 1000 MCG tablet Take 1,000 mcg by mouth daily.   Yes Historical Provider, MD  nitrofurantoin (MACRODANTIN) 100 MG capsule Take 1 capsule (100 mg total) by mouth 4 (four) times daily. 05/23/16 05/30/16  Daymon Larsen, MD  potassium chloride 20 MEQ TBCR Take 20 mEq by mouth daily. 05/23/16  Daymon Larsen, MD    Allergies Azithromycin; Clindamycin; Clindamycin/lincomycin; Codeine; Hyoscyamine; Maxitrol [neomycin-polymyxin-dexameth]; Timolol; and Penicillins  No family history on file.  Social History Social History  Substance Use Topics  . Smoking status: Never Smoker  . Smokeless tobacco: Never Used  . Alcohol use No    Review of Systems Constitutional: No fever/chills Eyes: No visual changes. ENT: No sore throat. Cardiovascular: Denies chest pain. Respiratory: Denies shortness of  breath. Gastrointestinal:Positive for abdominal pain and nausea. Genitourinary: Negative for dysuria. Musculoskeletal: Negative for back pain. Skin: Negative for rash. Neurological: Negative for headaches, focal weakness or numbness.  10-point ROS otherwise negative.  ____________________________________________   PHYSICAL EXAM:  VITAL SIGNS: ED Triage Vitals  Enc Vitals Group     BP 05/23/16 0551 (!) 172/91     Pulse Rate 05/23/16 0551 90     Resp 05/23/16 0551 18     Temp 05/23/16 0551 98.9 F (37.2 C)     Temp Source 05/23/16 0551 Oral     SpO2 --      Weight 05/23/16 0545 126 lb (57.2 kg)     Height 05/23/16 0545 5\' 5"  (1.651 m)     Head Circumference --      Peak Flow --      Pain Score 05/23/16 0545 7     Pain Loc --      Pain Edu? --      Excl. in [place]? --     Constitutional: Alert and oriented. Well appearing and in no acute distress. Eyes: Conjunctivae are normal. PERRL. EOMI. Head: Atraumatic. Mouth/Throat: Mucous membranes are moist.  Oropharynx non-erythematous. Neck: No stridor.  Cardiovascular: Normal rate, regular rhythm. Good peripheral circulation. Grossly normal heart sounds. Respiratory: Normal respiratory effort.  No retractions. Lungs CTAB. Gastrointestinal: Positive for right upper quadrant/right lower quadrant pain. No distention.  Musculoskeletal: No lower extremity tenderness nor edema. No gross deformities of extremities. Neurologic:  Normal speech and language. No gross focal neurologic deficits are appreciated.  Skin:  Skin is warm, dry and intact. No rash noted. Psychiatric: Mood and affect are normal. Speech and behavior are normal.  ____________________________________________   LABS (all labs ordered are listed, but only abnormal results are displayed)  Labs Reviewed  URINE CULTURE - Abnormal; Notable for the following:       Result Value   Culture >=100,000 COLONIES/mL ESCHERICHIA COLI (*)    All other components within normal  limits  CBC WITH DIFFERENTIAL/PLATELET - Abnormal; Notable for the following:    RBC 3.72 (*)    Lymphs Abs 0.4 (*)    All other components within normal limits  COMPREHENSIVE METABOLIC PANEL - Abnormal; Notable for the following:    Potassium 2.7 (*)    Glucose, Bld 123 (*)    Calcium 8.3 (*)    AST 50 (*)    All other components within normal limits  URINALYSIS, COMPLETE (UACMP) WITH MICROSCOPIC - Abnormal; Notable for the following:    Color, Urine YELLOW (*)    APPearance HAZY (*)    Hgb urine dipstick MODERATE (*)    Ketones, ur 20 (*)    Protein, ur 30 (*)    Leukocytes, UA LARGE (*)    Bacteria, UA RARE (*)    Squamous Epithelial / LPF 0-5 (*)    All other components within normal limits  LIPASE, BLOOD     RADIOLOGY I, [place] N Karinna Beadles, personally viewed and evaluated these images (plain radiographs) as part of my medical decision  making, as well as reviewing the written report by the radiologist.   CLINICAL DATA:  Lower abdominal pain for 5 days. Nausea and diarrhea. History skin cancer. Right upper quadrant/right lower quadrant pain.  EXAM: CT ABDOMEN AND PELVIS WITH CONTRAST  TECHNIQUE: Multidetector CT imaging of the abdomen and pelvis was performed using the standard protocol following bolus administration of intravenous contrast.  CONTRAST:  190mL ISOVUE-300 IOPAMIDOL (ISOVUE-300) INJECTION 61%  COMPARISON:  03/16/2015  FINDINGS: Lower chest: Bibasilar scarring. Mild cardiomegaly with right coronary artery atherosclerosis. Tiny hiatal hernia.  Hepatobiliary: Left hepatic lobe cyst. 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 Normal gallbladder, without biliary ductal dilatation.  Pancreas: Normal, without mass or ductal dilatation.  Spleen: Old granulomatous disease in the spleen.  Adrenals/Urinary Tract: Normal adrenal glands. Punctate right renal collecting system calculus. Lower pole left renal cysts.  Too small to characterize lower pole right renal lesion. At least partially duplicated right renal collecting system. Heterogeneous renal enhancement, including on image 13/ series 7. Right-sided renal pelvic hyper enhancement on image 31/series 2. Mild bladder wall thickening and irregularity.  Stomach/Bowel: Normal remainder of the stomach. Normal colon. Appendix is not visualized but there is no evidence of right lower quadrant inflammation. Normal small bowel.  Vascular/Lymphatic: Aortic and branch vessel atherosclerosis. No abdominopelvic adenopathy.  Reproductive: Normal uterus. Residual right ovarian follicle of 1.4 cm. Prominent gonadal veins.  Other: No significant free fluid.  Mild pelvic floor laxity.  Musculoskeletal: Paget's disease of the left hemipelvis.  IMPRESSION: 1. Right-sided pyelonephritis and cystitis. At least partially duplicated right renal collecting system. 2. Right nephrolithiasis without obstructive uropathy. 3.  Coronary artery atherosclerosis. Aortic atherosclerosis. 4. Prominent gonadal veins, as can be seen with pelvic congestion syndrome.   Electronically Signed   By: Abigail Miyamoto M.D.   On: 05/23/2016 07:42    Procedures     INITIAL IMPRESSION / ASSESSMENT AND PLAN / ED COURSE  Pertinent labs & imaging results that were available during my care of the patient were reviewed by me and considered in my medical decision making (see chart for details).   Patient pending CT scan of the abdomen and pelvis, urinalysis pending patient's care transferred to Dr. Marcelene Butte    Clinical Course     ____________________________________________  FINAL CLINICAL IMPRESSION(S) / ED DIAGNOSES  Final diagnoses:  Lower urinary tract infectious disease  Hypokalemia   MEDICATIONS GIVEN DURING THIS VISIT:  Medications  iopamidol (ISOVUE-300) 61 % injection 30 mL (30 mLs Oral Contrast Given 05/23/16 0555)  ondansetron (ZOFRAN)  injection 4 mg (4 mg Intravenous Given 05/23/16 [id number])  potassium chloride SA (K-DUR,KLOR-CON) CR tablet 20 mEq (20 mEq Oral Given 05/23/16 0706)  iopamidol (ISOVUE-300) 61 % injection 100 mL (100 mLs Intravenous Contrast Given 05/23/16 0720)  cefTRIAXone (ROCEPHIN) IVPB 1 g (1 g Intravenous Given 05/23/16 0738)     NEW OUTPATIENT MEDICATIONS STARTED DURING THIS VISIT:  Discharge Medication List as of 05/23/2016  8:00 AM    START taking these medications   Details  nitrofurantoin (MACRODANTIN) 100 MG capsule Take 1 capsule (100 mg total) by mouth 4 (four) times daily., Starting Mon 05/23/2016, Until Mon 05/30/2016, Print    potassium chloride 20 MEQ TBCR Take 20 mEq by mouth daily., Starting Mon 05/23/2016, Print        Discharge Medication List as of 05/23/2016  8:00 AM      Discharge Medication List as of 05/23/2016  8:00 AM  Note:  This document was prepared using Dragon voice recognition software and may include unintentional dictation errors.    Gregor Hams, MD 05/25/16 [phone]

## 2016-05-23 NOTE — Discharge Instructions (Signed)
Please drink plenty of fluids and take Tylenol for pain. Return to the emergency department for fever, persistent vomiting, increasing abdominal pain or any other new concerns. Urine culture is pending. Results will likely not return for at least 24-48 hours. Contact will be made with use the antibiotics provided do not effectively cover the infection. Please eat foods that contain potassium such as bananas. Contact your primary physician in the morning for outpatient management and recheck

## 2016-05-23 NOTE — ED Provider Notes (Signed)
----------------------------------------- 7:58 AM on 05/23/2016 -----------------------------------------   Blood pressure (!) 175/95, pulse (!) 56, temperature 98.9 F (37.2 C), temperature source Oral, resp. rate 18, height 5\' 5"  (1.651 m), weight 126 lb (57.2 kg), SpO2 92 %.  Assuming care from Dr. Owens Shark.  In short, Christy Waters is a 82 y.o. female with a chief complaint of Abdominal Pain .  Refer to the original H&P for additional details. "Ct Abdomen Pelvis W Contrast  Result Date: 05/23/2016 CLINICAL DATA:  Lower abdominal pain for 5 days. Nausea and diarrhea. History skin cancer. Right upper quadrant/right lower quadrant pain. EXAM: CT ABDOMEN AND PELVIS WITH CONTRAST TECHNIQUE: Multidetector CT imaging of the abdomen and pelvis was performed using the standard protocol following bolus administration of intravenous contrast. CONTRAST:  160mL ISOVUE-300 IOPAMIDOL (ISOVUE-300) INJECTION 61% COMPARISON:  03/16/2015 FINDINGS: Lower chest: Bibasilar scarring. Mild cardiomegaly with right coronary artery atherosclerosis. Tiny hiatal hernia. Hepatobiliary: Left hepatic lobe cyst. 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 Normal gallbladder, without biliary ductal dilatation. Pancreas: Normal, without mass or ductal dilatation. Spleen: Old granulomatous disease in the spleen. Adrenals/Urinary Tract: Normal adrenal glands. Punctate right renal collecting system calculus. Lower pole left renal cysts. Too small to characterize lower pole right renal lesion. At least partially duplicated right renal collecting system. Heterogeneous renal enhancement, including on image 13/ series 7. Right-sided renal pelvic hyper enhancement on image 31/series 2. Mild bladder wall thickening and irregularity. Stomach/Bowel: Normal remainder of the stomach. Normal colon. Appendix is not visualized but there is no evidence of right lower quadrant inflammation. Normal small bowel.  Vascular/Lymphatic: Aortic and branch vessel atherosclerosis. No abdominopelvic adenopathy. Reproductive: Normal uterus. Residual right ovarian follicle of 1.4 cm. Prominent gonadal veins. Other: No significant free fluid.  Mild pelvic floor laxity. Musculoskeletal: Paget's disease of the left hemipelvis. IMPRESSION: 1. Right-sided pyelonephritis and cystitis. At least partially duplicated right renal collecting system. 2. Right nephrolithiasis without obstructive uropathy. 3.  Coronary artery atherosclerosis. Aortic atherosclerosis. 4. Prominent gonadal veins, as can be seen with pelvic congestion syndrome. Electronically Signed   By: Abigail Miyamoto M.D.   On: 05/23/2016 07:42  "  The current plan of care is to *fall the patient's results of her CAT scan. We also added a urinalysis and upon return does appear to show significant findings of urinary tract infection. In combination with results on the CAT scan for early pyelonephritis and cystitis the patient was established on IV antibiotics here with 1 g of IV Rocephin. She was also given some supplementary potassium for hypokalemia. The patient's case was reviewed with her family and they feel comfortable along with myself for attempting outpatient management at this time. She currently is afebrile and denies any significant focal pain and I felt this was unlikely to be a surgical abdomen at this time. " New Prescriptions   NITROFURANTOIN (MACRODANTIN) 100 MG CAPSULE    Take 1 capsule (100 mg total) by mouth 4 (four) times daily.   POTASSIUM CHLORIDE 20 MEQ TBCR    Take 20 mEq by mouth daily.  " Assessment: Early pyelonephritis  Plan: Outpatient management Patient was advised to return immediately if condition worsens. Patient was advised to follow up with their primary care physician or other specialized physicians involved in their outpatient care. The patient and/or family member/power of attorney had laboratory results reviewed at the bedside. All  questions and concerns were addressed and appropriate discharge instructions were distributed by the nursing staff.  Daymon Larsen, MD 05/23/16 0800

## 2016-05-25 LAB — URINE CULTURE

## 2016-06-30 ENCOUNTER — Encounter: Payer: Self-pay | Admitting: Emergency Medicine

## 2016-06-30 ENCOUNTER — Emergency Department
Admission: EM | Admit: 2016-06-30 | Discharge: 2016-06-30 | Disposition: A | Discharge status: Home / Self Care | Payer: Medicare Other | Attending: Emergency Medicine | Admitting: Emergency Medicine

## 2016-06-30 ENCOUNTER — Emergency Department: Payer: Medicare Other

## 2016-06-30 ENCOUNTER — Ambulatory Visit: Payer: Medicare Other | Admitting: Podiatry

## 2016-06-30 DIAGNOSIS — Y939 Activity, unspecified: Secondary | ICD-10-CM | POA: Insufficient documentation

## 2016-06-30 DIAGNOSIS — S39012A Strain of muscle, fascia and tendon of lower back, initial encounter: Secondary | ICD-10-CM | POA: Diagnosis not present

## 2016-06-30 DIAGNOSIS — Y929 Unspecified place or not applicable: Secondary | ICD-10-CM | POA: Insufficient documentation

## 2016-06-30 DIAGNOSIS — W19XXXA Unspecified fall, initial encounter: Secondary | ICD-10-CM | POA: Insufficient documentation

## 2016-06-30 DIAGNOSIS — Y999 Unspecified external cause status: Secondary | ICD-10-CM | POA: Insufficient documentation

## 2016-06-30 DIAGNOSIS — Z85828 Personal history of other malignant neoplasm of skin: Secondary | ICD-10-CM | POA: Insufficient documentation

## 2016-06-30 DIAGNOSIS — S3992XA Unspecified injury of lower back, initial encounter: Secondary | ICD-10-CM | POA: Diagnosis present

## 2016-06-30 LAB — URINALYSIS, COMPLETE (UACMP) WITH MICROSCOPIC
BILIRUBIN URINE: NEGATIVE
Glucose, UA: NEGATIVE mg/dL
Ketones, ur: NEGATIVE mg/dL
LEUKOCYTES UA: NEGATIVE
NITRITE: NEGATIVE
Protein, ur: NEGATIVE mg/dL
Specific Gravity, Urine: 1.009 (ref 1.005–1.030)
pH: 6 (ref 5.0–8.0)

## 2016-06-30 MED ORDER — HYDROCODONE-ACETAMINOPHEN 5-325 MG PO TABS
ORAL_TABLET | ORAL | Status: AC
  Administered 2016-06-30: 1 via ORAL
  Filled 2016-06-30: qty 1

## 2016-06-30 MED ORDER — HYDROCODONE-ACETAMINOPHEN 5-325 MG PO TABS
1.0000 | ORAL_TABLET | Freq: Once | ORAL | Status: AC
  Administered 2016-06-30: 1 via ORAL

## 2016-06-30 NOTE — ED Triage Notes (Signed)
Pt to ED via EMS from eye center, states mechanical fall coming out of her appt. Pt fell on her bottom, denies hitting her head or any LOC. Pt A&Ox4, c/o lower back pain.

## 2016-06-30 NOTE — ED Provider Notes (Signed)
[facility] Emergency Department Provider Note        Time seen: ----------------------------------------- 1:38 PM on 06/30/2016 -----------------------------------------    I have reviewed the triage vital signs and the nursing notes.   HISTORY  Chief Complaint Fall    HPI Christy Waters is a 83 y.o. female who presents to the ER for mechanical fall. Patient was coming out of her apartment when she fell. She landed on her buttocks. She was complaining of low back pain and pain in her low back when she moved her legs. She's not had any other injuries or complaints. Pain is 4 out of 10.   Past Medical History:  Diagnosis Date  . Anemia    distant past  . Arrhythmia    followed by PCP  . Arthritis    fingers, hips  . Cancer ([place])    skin cancer; some spots on left side of face; been 5 years since last treated;   . Headache   . Heart murmur    followed by PCP  . High cholesterol   . Hypercholesteremia   . Osteoporosis   . Skin cancer     Patient Active Problem List   Diagnosis Date Noted  . 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 Irritable bowel syndrome with diarrhea 05/20/2014  . Abdominal pain, generalized 04/01/2014  . Atherosclerosis of aorta ([place]) 03/05/2014  . 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 Idiopathic peripheral neuropathy 03/05/2014  . Abdominal pain of unknown cause 02/06/2014  . MI (mitral incompetence) 02/06/2014  . Stress incontinence 02/06/2014  . Fibrosis of lung ([place]) 10/03/2013  . Generalized OA 10/03/2013  . HLD (hyperlipidemia) 10/03/2013  . BP (high blood pressure) 10/03/2013  . OP (osteoporosis) 10/03/2013  . Beat, premature ventricular 10/03/2013    Past Surgical History:  Procedure Laterality Date  . CATARACT EXTRACTION W/PHACO Left 09/14/2015   Procedure: CATARACT EXTRACTION PHACO AND INTRAOCULAR LENS PLACEMENT (IOC);  Surgeon: Ronnell Freshwater, MD;  Location: [place];  Service:  Ophthalmology;  Laterality: Left;  [place] Right    cataract surgery schedule for Easter Monday 2017;     Allergies Azithromycin; Clindamycin; Clindamycin/lincomycin; Codeine; Hyoscyamine; Maxitrol [neomycin-polymyxin-dexameth]; Timolol; and Penicillins  Social History Social History  Substance Use Topics  . Smoking status: Never Smoker  . Smokeless tobacco: Never Used  . Alcohol use No    Review of Systems Constitutional: Negative for fever. Cardiovascular: Negative for chest pain. Respiratory: Negative for shortness of breath. Gastrointestinal: Negative for abdominal pain, vomiting and diarrhea. Genitourinary: Negative for dysuria. Musculoskeletal: Positive for back pain Skin: Negative for rash. Neurological: Negative for headaches, focal weakness or numbness.  10-point ROS otherwise negative.  ____________________________________________   PHYSICAL EXAM:  VITAL SIGNS: ED Triage Vitals  Enc Vitals Group     BP 06/30/16 1331 (!) 191/87     Pulse Rate 06/30/16 1329 64     Resp 06/30/16 1329 14     Temp 06/30/16 1329 98.4 F (36.9 C)     Temp Source 06/30/16 1329 Oral     SpO2 06/30/16 1329 97 %     Weight --      Height --      Head Circumference --      Peak Flow --      Pain Score 06/30/16 1329 4     Pain Loc --      Pain Edu? --      Excl. in [place]? --  Constitutional: Alert and oriented. Well appearing and in no distress. Eyes: Conjunctivae are normal. PERRL. Normal extraocular movements. ENT   Head: Normocephalic and atraumatic.   Nose: No congestion/rhinnorhea.   Mouth/Throat: Mucous membranes are moist.   Neck: No stridor. Cardiovascular: Normal rate, regular rhythm. No murmurs, rubs, or gallops. Respiratory: Normal respiratory effort without tachypnea nor retractions. Breath sounds are clear and equal bilaterally. No wheezes/rales/rhonchi. Gastrointestinal: Soft and nontender. Normal bowel sounds Musculoskeletal:  Nontender with normal range of motion in all extremities. No lower extremity tenderness nor edema.Mild tenderness in the lumbar sacral spine area. Neurologic:  Normal speech and language. No gross focal neurologic deficits are appreciated.  Skin:  Skin is warm, dry and intact. No rash noted. Psychiatric: Mood and affect are normal. Speech and behavior are normal.  ____________________________________________  ED COURSE:  Pertinent labs & imaging results that were available during my care of the patient were reviewed by me and considered in my medical decision making (see chart for details). Patient is in no distress, we will assess with basic imaging. She has declined pain medicine at this time.   Procedures ____________________________________________   RADIOLOGY Images were viewed by me  Lumbosacral spine, sacral x-rays IMPRESSION: No evidence of fracture of the sacrum or coccyx. IMPRESSION: No acute fracture or subluxation identified about the lumbosacral spine.  Multilevel osteoarthritic changes with mild remodeling of vertebral bodies. ____________________________________________  FINAL ASSESSMENT AND PLAN  Fall, low back pain  Plan: Patient with labs and imaging as dictated above. Patient likely with contusion and muscle strain in the low back. No x-ray findings were concerning. She is advised to use ice pack, stretching and massage. She has hydrocodone she can take for pain.   Earleen Newport, MD   Note: This note was generated in part or whole with voice recognition software. Voice recognition is usually quite accurate but there are transcription errors that can and very often do occur. I apologize for any typographical errors that were not detected and corrected.     Earleen Newport, MD 06/30/16 [phone]

## 2016-06-30 NOTE — ED Notes (Signed)
Patient transported to X-ray 

## 2016-06-30 NOTE — ED Notes (Signed)
Pt ambulatory to bedside commode with assistance, denies any dizziness.

## 2016-06-30 NOTE — ED Notes (Signed)
ED Provider at bedside. 

## 2016-07-04 ENCOUNTER — Emergency Department
Admission: EM | Admit: 2016-07-04 | Discharge: 2016-07-04 | Disposition: A | Discharge status: Home / Self Care | Payer: Medicare Other | Attending: Emergency Medicine | Admitting: Emergency Medicine

## 2016-07-04 ENCOUNTER — Emergency Department: Payer: Medicare Other

## 2016-07-04 ENCOUNTER — Encounter: Payer: Self-pay | Admitting: Emergency Medicine

## 2016-07-04 ENCOUNTER — Ambulatory Visit: Payer: Medicare Other | Admitting: Podiatry

## 2016-07-04 DIAGNOSIS — M1612 Unilateral primary osteoarthritis, left hip: Secondary | ICD-10-CM | POA: Diagnosis not present

## 2016-07-04 DIAGNOSIS — Z85828 Personal history of other malignant neoplasm of skin: Secondary | ICD-10-CM | POA: Diagnosis not present

## 2016-07-04 DIAGNOSIS — Y939 Activity, unspecified: Secondary | ICD-10-CM | POA: Diagnosis not present

## 2016-07-04 DIAGNOSIS — S79912A Unspecified injury of left hip, initial encounter: Secondary | ICD-10-CM | POA: Diagnosis present

## 2016-07-04 DIAGNOSIS — Y929 Unspecified place or not applicable: Secondary | ICD-10-CM | POA: Diagnosis not present

## 2016-07-04 DIAGNOSIS — S7002XA Contusion of left hip, initial encounter: Secondary | ICD-10-CM | POA: Diagnosis not present

## 2016-07-04 DIAGNOSIS — W1839XA Other fall on same level, initial encounter: Secondary | ICD-10-CM | POA: Insufficient documentation

## 2016-07-04 DIAGNOSIS — R296 Repeated falls: Secondary | ICD-10-CM

## 2016-07-04 DIAGNOSIS — Y999 Unspecified external cause status: Secondary | ICD-10-CM | POA: Insufficient documentation

## 2016-07-04 DIAGNOSIS — M1610 Unilateral primary osteoarthritis, unspecified hip: Secondary | ICD-10-CM

## 2016-07-04 MED ORDER — HYDROCODONE-ACETAMINOPHEN 5-325 MG PO TABS: 1.0000 | ORAL_TABLET | Freq: Four times a day (QID) | ORAL | 0 refills | Status: DC | PRN

## 2016-07-04 MED ORDER — HYDROCODONE-ACETAMINOPHEN 5-325 MG PO TABS
1.0000 | ORAL_TABLET | Freq: Once | ORAL | Status: AC
  Administered 2016-07-04: 1 via ORAL
  Filled 2016-07-04: qty 1

## 2016-07-04 NOTE — Care Management Note (Signed)
Case Management Note  Patient Details  Name: Dalexa Cloos MRN: [id number] Date of Birth: 1918-09-27  Subjective/Objective:    Referral accepted by [facility]. Referral faxed to [phone]. The first appt. Is next week, but I have explained that to the daughter and she is okay with this. She has been provided with brochures for [facility] as well as [facility] for additional help, that she can pay out of pocket for.                Action/Plan:   Expected Discharge Date:                  Expected Discharge Plan:     In-House Referral:     Discharge planning Services  CM Consult  Post Acute Care Choice:    Choice offered to:     DME Arranged:    DME Agency:     HH Arranged:    [place] Agency:  [facility] (now [facility])  Status of Service:     If discussed at [employer] of Stay Meetings, dates discussed:    Additional Comments:  Beau Fanny, RN 07/04/2016, 2:28 PM

## 2016-07-04 NOTE — Care Management Note (Signed)
Case Management Note  Patient Details  Name: Christy Waters MRN: [id number] Date of Birth: 19-Apr-1919  Subjective/Objective:      Referral for home PT, OT and CSW made through [place] at [place] [facility]. MD entering face to face now. Awaiting confirmation from [place].              Action/Plan:   Expected Discharge Date:                  Expected Discharge Plan:     In-House Referral:     Discharge planning Services  CM Consult  Post Acute Care Choice:    Choice offered to:     DME Arranged:    DME Agency:     HH Arranged:    [place] Agency:  [facility] (now [facility])  Status of Service:     If discussed at [employer] of Stay Meetings, dates discussed:    Additional Comments:  Beau Fanny, RN 07/04/2016, 1:00 PM

## 2016-07-04 NOTE — ED Triage Notes (Addendum)
Pt to ed via ems from home.  Pt reports mechanical fall during he night while trying to go to he bathroom.  Pt with recent fall on Thursday last week.  Pt reports left hip pain worse since fall last night.  Deep Bruising noted to left upper arm.  Pt reports unable to bear weight on leg.  Good ROM in left leg.  Denies loss of consciousness, denies hitting head during the fall.  Pt curently on narcotics for pain management. Vss. Skin warm and dry.  Pt alert and oriented.  Dr Corky Downs at bedside.

## 2016-07-04 NOTE — Care Management Note (Signed)
Case Management Note  Patient Details  Name: Christy Waters MRN: [id number] Date of Birth: 1918-10-04  Subjective/Objective:  [facility] cannot serve the patient at this time. Will keep looking for an agency.                  Action/Plan:   Expected Discharge Date:                  Expected Discharge Plan:     In-House Referral:     Discharge planning Services  CM Consult  Post Acute Care Choice:    Choice offered to:     DME Arranged:    DME Agency:     HH Arranged:    [place] Agency:  [facility] (now [facility])  Status of Service:     If discussed at [employer] of Stay Meetings, dates discussed:    Additional Comments:  Beau Fanny, RN 07/04/2016, 1:10 PM

## 2016-07-04 NOTE — Evaluation (Signed)
Physical Therapy Evaluation Patient Details Name: Christy Waters MRN: [id number] DOB: December 10, 1918 Today's Date: 07/04/2016   History of Present Illness  presented to ER status post fall with L hip pain; imaging negative for acute injury.  Additional fall reported one week prior (two total in prior six months)  Clinical Impression  Upon evaluation, patient alert and oriented; follows all commands and demonstrates fair/good inight and safety awareness.  Bilat UE/LE strength and ROM grossly symmetrical and WFL; no focal weakness or sensory deficit noted.  Able to complete bed mobility with mod indep; sit/stand, basic transfers and gait (60') without assist device, min assist.  Excessive lateral sway, requiring use of LE step strategy, min assist from therapist for balance recovery during multiple LOB. Issued and trialed use of RW for subsequent mobility, improving all transfers and gait (120') to cga/close sup.  Noted improvement in symmetry, safety and overall stability with use of assist device (increased cadence, gait speed; less sway, LOB).  Do recommend continued use of RW for all mobility.  Also discussed use of BSC and TTB for optimal safety in home environment.  Patient/daughter voiced understanding/agreement. Would benefit from skilled PT to address above deficits and promote optimal return to PLOF; Recommend transition to [place] upon discharge from acute hospitalization.     Follow Up Recommendations Home health PT (HHOT, [facility])    Equipment Recommendations  Rolling walker with 5" wheels    Recommendations for Other Services OT consult     Precautions / Restrictions Precautions Precautions: Fall Restrictions Weight Bearing Restrictions: No      Mobility  Bed Mobility Overal bed mobility: Modified Independent                Transfers Overall transfer level: Needs assistance   Transfers: Sit to/from Stand Sit to Stand: Min assist         General transfer  comment: intermittent L hip 'catch' with sit/stand transition, requiring multiple attempts at times  Ambulation/Gait Ambulation/Gait assistance: Min assist Ambulation Distance (Feet): 60 Feet Assistive device: None       General Gait Details: narrowed BOS, increased sway bilat requiring min assist for recovery of multiple LOB  Stairs            Wheelchair Mobility    Modified Rankin (Stroke Patients Only)       Balance Overall balance assessment: Needs assistance Sitting-balance support: No upper extremity supported;Feet supported Sitting balance-Leahy Scale: Good     Standing balance support: No upper extremity supported Standing balance-Leahy Scale: Poor                               Pertinent Vitals/Pain Pain Assessment: Faces Faces Pain Scale: Hurts little more Pain Location: L hip/back Pain Descriptors / Indicators:  ("catch") Pain Intervention(s): Limited activity within patient's tolerance;Monitored during session;Repositioned    Home Living Family/patient expects to be discharged to:: Private residence Living Arrangements: Children (live with son (works outside of home, 2-3 hours/time)) Available Help at Discharge: Family;Personal care attendant Type of Home: House Home Access: Stairs to enter Entrance Stairs-Rails: Left Entrance Stairs-Number of Steps: 3 Home Layout: One level Home Equipment: None Additional Comments: Has aide 2 days/week, 4 hours/day    Prior Function Level of Independence: Independent         Comments: Indep with ADLs, household and limited community mobility; sup/assist with household chores from son as needed.  Endorses two falls within previous six months.  Hand Dominance        Extremity/Trunk Assessment   Upper Extremity Assessment Upper Extremity Assessment: Generalized weakness    Lower Extremity Assessment Lower Extremity Assessment: Generalized weakness (grossly at least 4/5 throughout)        Communication   Communication: No difficulties  Cognition Arousal/Alertness: Awake/alert Behavior During Therapy: WFL for tasks assessed/performed Overall Cognitive Status: Within Functional Limits for tasks assessed                      General Comments      Exercises Other Exercises Other Exercises: Sit/stand with RW, cga/close sup-cuing for hand placement Other Exercises: Gait x120' with RW, cga/close sup-improved safety, stability and overall gait symmetry with use of RW.  Patietn voicing optimal comfort.  Do recommend continued use of RW for ALL mobility upon discharge.  Patient/daughter voiced understanding/agreement.   Assessment/Plan    PT Assessment Patient needs continued PT services  PT Problem List Decreased activity tolerance;Decreased mobility;Decreased balance;Decreased safety awareness;Decreased knowledge of use of DME;Decreased knowledge of precautions          PT Treatment Interventions DME instruction;Gait training;Stair training;Functional mobility training;Therapeutic activities;Therapeutic exercise;Balance training;Patient/family education    PT Goals (Current goals can be found in the Care Plan section)  Acute Rehab PT Goals Patient Stated Goal: to return home and to make sure she doesn't need 'in house' rehab PT Goal Formulation: With patient/family Time For Goal Achievement: 07/18/16 Potential to Achieve Goals: Good    Frequency Min 2X/week   Barriers to discharge Decreased caregiver support      Co-evaluation               End of Session Equipment Utilized During Treatment: Gait belt Activity Tolerance: Patient tolerated treatment well Patient left: in bed;with call bell/phone within reach;with family/visitor present Nurse Communication: Mobility status    Functional Assessment Tool Used: clinical judgement Functional Limitation: Mobility: Walking and moving around Mobility: Walking and Moving Around Current Status  [phone]): At least 20 percent but less than 40 percent impaired, limited or restricted Mobility: Walking and Moving Around Goal Status [phone]): At least 1 percent but less than 20 percent impaired, limited or restricted    Time: [id number] PT Time Calculation (min) (ACUTE ONLY): 33 min   Charges:   PT Evaluation $PT Eval Low Complexity: 1 Procedure PT Treatments $Gait Training: 8-22 mins   PT G Codes:   PT G-Codes **NOT FOR INPATIENT CLASS** Functional Assessment Tool Used: clinical judgement Functional Limitation: Mobility: Walking and moving around Mobility: Walking and Moving Around Current Status [id number]): At least 20 percent but less than 40 percent impaired, limited or restricted Mobility: Walking and Moving Around Goal Status [phone]): At least 1 percent but less than 20 percent impaired, limited or restricted    Ailie Gage H. Owens Shark, PT, DPT, NCS 07/04/16, 4:22 PM [phone]

## 2016-07-04 NOTE — ED Provider Notes (Addendum)
[facility] Emergency Department Provider Note   ____________________________________________    I have reviewed the triage vital signs and the nursing notes.   HISTORY  Chief Complaint Fall     HPI Christy Waters is a 83 y.o. female who presents with complaints of left hip pain after a fall. Patient reports she fell on Thursday and injured her left hip and fell again this morning, she states she lost her balance. Daughter reports the patient is not perfectly stable on her feet at baseline. No other injuries reported. No head injury. No blood thinners. No nausea vomiting or abdominal pain.   Past Medical History:  Diagnosis Date  . Anemia    distant past  . Arrhythmia    followed by PCP  . Arthritis    fingers, hips  . Cancer ([place])    skin cancer; some spots on left side of face; been 5 years since last treated;   . Headache   . Heart murmur    followed by PCP  . High cholesterol   . Hypercholesteremia   . Osteoporosis   . Skin cancer     Patient Active Problem List   Diagnosis Date Noted  . 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 Irritable bowel syndrome with diarrhea 05/20/2014  . Abdominal pain, generalized 04/01/2014  . Atherosclerosis of aorta ([place]) 03/05/2014  . 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 Idiopathic peripheral neuropathy 03/05/2014  . Abdominal pain of unknown cause 02/06/2014  . MI (mitral incompetence) 02/06/2014  . Stress incontinence 02/06/2014  . Fibrosis of lung ([place]) 10/03/2013  . Generalized OA 10/03/2013  . HLD (hyperlipidemia) 10/03/2013  . BP (high blood pressure) 10/03/2013  . OP (osteoporosis) 10/03/2013  . Beat, premature ventricular 10/03/2013    Past Surgical History:  Procedure Laterality Date  . CATARACT EXTRACTION W/PHACO Left 09/14/2015   Procedure: CATARACT EXTRACTION PHACO AND INTRAOCULAR LENS PLACEMENT (IOC);  Surgeon: Ronnell Freshwater, MD;  Location: [place];   Service: Ophthalmology;  Laterality: Left;  [place] Right    cataract surgery schedule for Easter Monday 2017;     Prior to Admission medications   Medication Sig Start Date End Date Taking? Authorizing Provider  buPROPion (WELLBUTRIN) 75 MG tablet Take 75 mg by mouth every morning.   Yes Historical Provider, MD  carboxymethylcellulose (REFRESH PLUS) 0.5 % SOLN Apply 1-2 drops to eye 2 (two) times daily as needed.    Yes Historical Provider, MD  chlordiazePOXIDE (LIBRIUM) 5 MG capsule Take 5 mg by mouth 3 (three) times daily.    Yes Historical Provider, MD  famotidine (PEPCID) 20 MG tablet Take 20 mg by mouth 2 (two) times daily.   Yes Historical Provider, MD  HYDROcodone-acetaminophen (NORCO/VICODIN) 5-325 MG tablet Take 0.5 tablets by mouth every 5 (five) hours as needed.   Yes Historical Provider, MD  LUMIGAN 0.01 % SOLN Place 1 drop into both eyes at bedtime.    Yes Historical Provider, MD  PARoxetine (PAXIL) 10 MG tablet Take 15 mg by mouth every evening.    Yes Historical Provider, MD  potassium chloride 20 MEQ TBCR Take 20 mEq by mouth daily. Patient taking differently: Take 8 mEq by mouth 2 (two) times daily.  05/23/16  Yes Daymon Larsen, MD     Allergies Azithromycin; Clindamycin; Clindamycin/lincomycin; Codeine; Hyoscyamine; Maxitrol [neomycin-polymyxin-dexameth]; Timolol; and Penicillins  History reviewed. No pertinent family history.  Social History Social History  Substance Use Topics  . Smoking status:  Never Smoker  . Smokeless tobacco: Never Used  . Alcohol use No    Review of Systems  Constitutional: No fever/chills Eyes: No visual changes.  ENT: No Neck pain Cardiovascular: Denies chest pain. Respiratory: Denies shortness of breath. Gastrointestinal: No abdominal pain.  Genitourinary: Negative for dysuria. Musculoskeletal: Negative for back pain. Left hip pain as above Skin: Negative for rash. Neurological: Negative for headaches    10-point ROS otherwise negative.  ____________________________________________   PHYSICAL EXAM:  VITAL SIGNS: ED Triage Vitals  Enc Vitals Group     BP 07/04/16 0951 (!) 182/84     Pulse Rate 07/04/16 0951 (!) 58     Resp 07/04/16 0951 18     Temp 07/04/16 0951 97.7 F (36.5 C)     Temp Source 07/04/16 0951 Oral     SpO2 07/04/16 0951 100 %     Weight 07/04/16 0952 126 lb (57.2 kg)     Height --      Head Circumference --      Peak Flow --      Pain Score 07/04/16 0952 8     Pain Loc --      Pain Edu? --      Excl. in [place]? --     Constitutional: Alert and oriented. No acute distress. Pleasant and interactive Eyes: Conjunctivae are normal.  Head: Atraumatic. Nose: No congestion/rhinnorhea. Mouth/Throat: Mucous membranes are moist.   Neck:  Painless ROM, no vt Cardiovascular: Normal rate, regular rhythm.  Good peripheral circulation. Respiratory: Normal respiratory effort.  No retractions. Lungs CTAB. Gastrointestinal: Soft and nontender. No distention.  No CVA tenderness. Genitourinary: deferred Musculoskeletal: Patient with mild left tenderness to palpation of the hip. Full range of motion, no pain with axial load. 2+ distal pulses. No bony abnormalities felt  Neurologic:  Normal speech and language. No gross focal neurologic deficits are appreciated.  Skin:  Skin is warm, dry and intact. No rash noted. Psychiatric: Mood and affect are normal. Speech and behavior are normal.  ____________________________________________   LABS (all labs ordered are listed, but only abnormal results are displayed)  Labs Reviewed - No data to display ____________________________________________  EKG  None ____________________________________________  RADIOLOGY  Hip x-ray unremarkable ____________________________________________   PROCEDURES  Procedure(s) performed: No    Critical Care performed: No ____________________________________________   INITIAL  IMPRESSION / ASSESSMENT AND PLAN / ED COURSE  Pertinent labs & imaging results that were available during my care of the patient were reviewed by me and considered in my medical decision making (see chart for details).  Patient presents after a fall, we will image her pelvis and left hip.    X-ray negative, we will treat her pain and attempt to ambulate her with a walker   Patient did quite well with walker was able to walk around the room without difficulty nor pain. We'll attempt to arrange home PT OT they do not feel she requires rehabilitation placement at this time. Family agrees. ____________________________________________ ----------------------------------------- 1:36 PM on 07/04/2016 -----------------------------------------  Care manager is attempting to find home health agency that will accept the patient's insurance  ----------------------------------------- 2:34 PM on 07/04/2016 -----------------------------------------  Care manager was able to find home health agency after significant effort and now daughter has decided that she would prefer to have the patient in rehabilitation if possible. I will consult PT for their evaluation  PT recommends home PT/OT, will discharge at this time.  FINAL CLINICAL IMPRESSION(S) / ED DIAGNOSES  Final diagnoses:  Contusion of  left hip, initial encounter  Hip arthritis      NEW MEDICATIONS STARTED DURING THIS VISIT:  New Prescriptions   No medications on file     Note:  This document was prepared using Dragon voice recognition software and may include unintentional dictation errors.    Lavonia Drafts, MD 07/04/16 1438    Lavonia Drafts, MD 07/04/16 [phone]

## 2016-08-16 ENCOUNTER — Encounter: Payer: Self-pay | Admitting: Podiatry

## 2016-08-16 ENCOUNTER — Ambulatory Visit (INDEPENDENT_AMBULATORY_CARE_PROVIDER_SITE_OTHER): Payer: Medicare Other | Admitting: Podiatry

## 2016-08-16 DIAGNOSIS — B351 Tinea unguium: Secondary | ICD-10-CM

## 2016-08-16 DIAGNOSIS — L608 Other nail disorders: Secondary | ICD-10-CM | POA: Diagnosis not present

## 2016-08-16 DIAGNOSIS — L603 Nail dystrophy: Secondary | ICD-10-CM | POA: Diagnosis not present

## 2016-08-16 DIAGNOSIS — M79609 Pain in unspecified limb: Secondary | ICD-10-CM

## 2016-08-24 NOTE — Progress Notes (Signed)
   SUBJECTIVE Patient  presents to office today complaining of elongated, thickened nails. Pain while ambulating in shoes. Patient is unable to trim their own nails.   OBJECTIVE General Patient is awake, alert, and oriented x 3 and in no acute distress. Derm Skin is dry and supple bilateral. Negative open lesions or macerations. Remaining integument unremarkable. Nails are tender, long, thickened and dystrophic with subungual debris, consistent with onychomycosis, 1-5 bilateral. No signs of infection noted. Vasc  DP and PT pedal pulses palpable bilaterally. Temperature gradient within normal limits.  Neuro Epicritic and protective threshold sensation diminished bilaterally.  Musculoskeletal Exam No symptomatic pedal deformities noted bilateral. Muscular strength within normal limits.  ASSESSMENT 1. Onychodystrophic nails 1-5 bilateral with hyperkeratosis of nails.  2. Onychomycosis of nail due to dermatophyte bilateral 3. Pain in foot bilateral  PLAN OF CARE 1. Patient evaluated today.  2. Instructed to maintain good pedal hygiene and foot care.  3. Mechanical debridement of nails 1-5 bilaterally performed using a nail nipper. Filed with dremel without incident.  4. Return to clinic in 3 mos.    Brent M. Evans, DPM [facility]  Dr. Brent M. Evans, DPM    [address]                                        [place], [place] [address]                Office [phone]  Fax [phone]      

## 2016-11-17 ENCOUNTER — Ambulatory Visit (INDEPENDENT_AMBULATORY_CARE_PROVIDER_SITE_OTHER): Payer: Medicare Other | Admitting: Podiatry

## 2016-11-17 ENCOUNTER — Encounter: Payer: Self-pay | Admitting: Podiatry

## 2016-11-17 DIAGNOSIS — M79609 Pain in unspecified limb: Secondary | ICD-10-CM | POA: Diagnosis not present

## 2016-11-17 DIAGNOSIS — B351 Tinea unguium: Secondary | ICD-10-CM | POA: Diagnosis not present

## 2016-11-17 NOTE — Progress Notes (Signed)
Complaint:  Visit Type: Patient returns to my office for continued preventative foot care services. Complaint: Patient states" my nails have grown long and thick and become painful to walk and wear shoes" Patient has been diagnosed with peripheral neuropathy. The patient presents for preventative foot care services. No changes to ROS  Podiatric Exam: Vascular: dorsalis pedis and posterior tibial pulses are palpable bilateral. Capillary return is immediate. Temperature gradient is WNL. Skin turgor WNL  Sensorium: Normal Semmes Weinstein monofilament test. Normal tactile sensation bilaterally. Nail Exam: Pt has thick disfigured discolored nails with subungual debris noted bilateral entire nail hallux through fifth toenails Ulcer Exam: There is no evidence of ulcer or pre-ulcerative changes or infection. Orthopedic Exam: Muscle tone and strength are WNL. No limitations in general ROM. No crepitus or effusions noted. Foot type and digits show no abnormalities. Bony prominences are unremarkable. Skin: No Porokeratosis. No infection or ulcers  Diagnosis:  Onychomycosis, , Pain in right toe, pain in left toes  Treatment & Plan Procedures and Treatment: Consent by patient was obtained for treatment procedures. The patient understood the discussion of treatment and procedures well. All questions were answered thoroughly reviewed. Debridement of mycotic and hypertrophic toenails, 1 through 5 bilateral and clearing of subungual debris. No ulceration, no infection noted.  Return Visit-Office Procedure: Patient instructed to return to the office for a follow up visit 3 months for continued evaluation and treatment.    Gardiner Barefoot DPM

## 2017-02-16 ENCOUNTER — Ambulatory Visit: Payer: Medicare Other | Admitting: Podiatry

## 2017-02-20 ENCOUNTER — Ambulatory Visit: Payer: Medicare Other | Admitting: Podiatry

## 2017-02-21 ENCOUNTER — Ambulatory Visit (INDEPENDENT_AMBULATORY_CARE_PROVIDER_SITE_OTHER): Payer: Medicare Other | Admitting: Podiatry

## 2017-02-21 ENCOUNTER — Encounter: Payer: Self-pay | Admitting: Podiatry

## 2017-02-21 DIAGNOSIS — M79609 Pain in unspecified limb: Secondary | ICD-10-CM | POA: Diagnosis not present

## 2017-02-21 DIAGNOSIS — B351 Tinea unguium: Secondary | ICD-10-CM

## 2017-02-22 NOTE — Progress Notes (Signed)
   SUBJECTIVE Patient  presents to office today complaining of elongated, thickened nails. Pain while ambulating in shoes. Patient is unable to trim their own nails.   Past Medical History:  Diagnosis Date  . Anemia    distant past  . Arrhythmia    followed by PCP  . Arthritis    fingers, hips  . Cancer ([place])    skin cancer; some spots on left side of face; been 5 years since last treated;   . Headache   . Heart murmur    followed by PCP  . High cholesterol   . Hypercholesteremia   . Osteoporosis   . Skin cancer     OBJECTIVE General Patient is awake, alert, and oriented x 3 and in no acute distress. Derm Skin is dry and supple bilateral. Negative open lesions or macerations. Remaining integument unremarkable. Nails are tender, long, thickened and dystrophic with subungual debris, consistent with onychomycosis, 1-5 bilateral. No signs of infection noted. Vasc  DP and PT pedal pulses palpable bilaterally. Temperature gradient within normal limits.  Neuro Epicritic and protective threshold sensation diminished bilaterally.  Musculoskeletal Exam No symptomatic pedal deformities noted bilateral. Muscular strength within normal limits.  ASSESSMENT 1. Onychodystrophic nails 1-5 bilateral with hyperkeratosis of nails.  2. Onychomycosis of nail due to dermatophyte bilateral 3. Pain in foot bilateral  PLAN OF CARE 1. Patient evaluated today.  2. Instructed to maintain good pedal hygiene and foot care.  3. Mechanical debridement of nails 1-5 bilaterally performed using a nail nipper. Filed with dremel without incident.  4. Return to clinic in 3 mos.    Edrick Kins, DPM [facility]  Dr. Edrick Kins, [address]                                        [place], [place] [address]                Office [phone]  Fax [phone]

## 2017-04-04 DIAGNOSIS — F331 Major depressive disorder, recurrent, moderate: Secondary | ICD-10-CM | POA: Insufficient documentation

## 2017-05-10 ENCOUNTER — Observation Stay
Admission: EM | Admit: 2017-05-10 | Discharge: 2017-05-11 | Disposition: A | Discharge status: Home / Self Care | Payer: Medicare Other | Attending: Internal Medicine | Admitting: Internal Medicine

## 2017-05-10 ENCOUNTER — Observation Stay (HOSPITAL_BASED_OUTPATIENT_CLINIC_OR_DEPARTMENT_OTHER)
Admit: 2017-05-10 | Discharge: 2017-05-10 | Disposition: A | Discharge status: Home / Self Care | Payer: Medicare Other | Attending: Cardiovascular Disease | Admitting: Cardiovascular Disease

## 2017-05-10 ENCOUNTER — Other Ambulatory Visit: Payer: Self-pay

## 2017-05-10 ENCOUNTER — Emergency Department: Payer: Medicare Other

## 2017-05-10 DIAGNOSIS — F419 Anxiety disorder, unspecified: Secondary | ICD-10-CM | POA: Diagnosis not present

## 2017-05-10 DIAGNOSIS — F329 Major depressive disorder, single episode, unspecified: Secondary | ICD-10-CM | POA: Insufficient documentation

## 2017-05-10 DIAGNOSIS — R739 Hyperglycemia, unspecified: Secondary | ICD-10-CM | POA: Diagnosis not present

## 2017-05-10 DIAGNOSIS — I361 Nonrheumatic tricuspid (valve) insufficiency: Secondary | ICD-10-CM | POA: Diagnosis not present

## 2017-05-10 DIAGNOSIS — I200 Unstable angina: Secondary | ICD-10-CM

## 2017-05-10 DIAGNOSIS — E785 Hyperlipidemia, unspecified: Secondary | ICD-10-CM | POA: Insufficient documentation

## 2017-05-10 DIAGNOSIS — R109 Unspecified abdominal pain: Secondary | ICD-10-CM

## 2017-05-10 DIAGNOSIS — R42 Dizziness and giddiness: Secondary | ICD-10-CM | POA: Diagnosis not present

## 2017-05-10 DIAGNOSIS — R748 Abnormal levels of other serum enzymes: Secondary | ICD-10-CM

## 2017-05-10 DIAGNOSIS — R6884 Jaw pain: Secondary | ICD-10-CM | POA: Insufficient documentation

## 2017-05-10 DIAGNOSIS — K219 Gastro-esophageal reflux disease without esophagitis: Secondary | ICD-10-CM | POA: Insufficient documentation

## 2017-05-10 DIAGNOSIS — G8929 Other chronic pain: Secondary | ICD-10-CM | POA: Diagnosis not present

## 2017-05-10 DIAGNOSIS — Z79899 Other long term (current) drug therapy: Secondary | ICD-10-CM | POA: Insufficient documentation

## 2017-05-10 DIAGNOSIS — R079 Chest pain, unspecified: Secondary | ICD-10-CM | POA: Diagnosis present

## 2017-05-10 DIAGNOSIS — Z85828 Personal history of other malignant neoplasm of skin: Secondary | ICD-10-CM | POA: Insufficient documentation

## 2017-05-10 DIAGNOSIS — I480 Paroxysmal atrial fibrillation: Principal | ICD-10-CM | POA: Insufficient documentation

## 2017-05-10 DIAGNOSIS — I4891 Unspecified atrial fibrillation: Secondary | ICD-10-CM | POA: Diagnosis not present

## 2017-05-10 DIAGNOSIS — I252 Old myocardial infarction: Secondary | ICD-10-CM | POA: Insufficient documentation

## 2017-05-10 DIAGNOSIS — E782 Mixed hyperlipidemia: Secondary | ICD-10-CM | POA: Diagnosis not present

## 2017-05-10 HISTORY — DX: Hyperlipidemia, unspecified: E78.5

## 2017-05-10 HISTORY — DX: Personal history of other medical treatment: Z92.89

## 2017-05-10 HISTORY — DX: Paroxysmal atrial fibrillation: I48.0

## 2017-05-10 LAB — BASIC METABOLIC PANEL
Anion gap: 9 (ref 5–15)
Anion gap: 6 (ref 5–15)
BUN: 21 mg/dL — AB (ref 6–20)
BUN: 25 mg/dL — AB (ref 6–20)
CHLORIDE: 108 mmol/L (ref 101–111)
CO2: 27 mmol/L (ref 22–32)
CO2: 26 mmol/L (ref 22–32)
CREATININE: 0.62 mg/dL (ref 0.44–1.00)
Calcium: 9.1 mg/dL (ref 8.9–10.3)
Calcium: 8.4 mg/dL — ABNORMAL LOW (ref 8.9–10.3)
Chloride: 104 mmol/L (ref 101–111)
Creatinine, Ser: 0.75 mg/dL (ref 0.44–1.00)
GFR calc Af Amer: >60 mL/min (ref >60)
GFR calc non Af Amer: >60 mL/min (ref >60)
GLUCOSE: 100 mg/dL — AB (ref 65–99)
Glucose, Bld: 108 mg/dL — ABNORMAL HIGH (ref 65–99)
POTASSIUM: 4 mmol/L (ref 3.5–5.1)
Potassium: 3.7 mmol/L (ref 3.5–5.1)
SODIUM: 140 mmol/L (ref 135–145)
SODIUM: 140 mmol/L (ref 135–145)

## 2017-05-10 LAB — HEMOGLOBIN A1C
Hgb A1c MFr Bld: 5 % (ref 4.8–5.6)
MEAN PLASMA GLUCOSE: 96.8 mg/dL

## 2017-05-10 LAB — TROPONIN I
TROPONIN I: 0.03 ng/mL — AB (ref <0.03)
Troponin I: <0.03 ng/mL (ref <0.03)
Troponin I: 0.03 ng/mL (ref <0.03)
Troponin I: 0.03 ng/mL (ref <0.03)

## 2017-05-10 LAB — CBC
HCT: 34.4 % — ABNORMAL LOW (ref 35.0–47.0)
HEMATOCRIT: 38.3 % (ref 35.0–47.0)
HEMOGLOBIN: 11.8 g/dL — AB (ref 12.0–16.0)
HEMOGLOBIN: 12.9 g/dL (ref 12.0–16.0)
MCH: 32.7 pg (ref 26.0–34.0)
MCH: 32.9 pg (ref 26.0–34.0)
MCHC: 33.5 g/dL (ref 32.0–36.0)
MCHC: 34.2 g/dL (ref 32.0–36.0)
MCV: 97.5 fL (ref 80.0–100.0)
MCV: 96.3 fL (ref 80.0–100.0)
Platelets: 184 10*3/uL (ref 150–440)
Platelets: 164 10*3/uL (ref 150–440)
RBC: 3.57 MIL/uL — AB (ref 3.80–5.20)
RBC: 3.93 MIL/uL (ref 3.80–5.20)
RDW: 13.7 % (ref 11.5–14.5)
RDW: 14 % (ref 11.5–14.5)
WBC: 3.5 10*3/uL — AB (ref 3.6–11.0)
WBC: 4.4 10*3/uL (ref 3.6–11.0)

## 2017-05-10 LAB — TSH: TSH: 1.854 u[IU]/mL (ref 0.350–4.500)

## 2017-05-10 LAB — ECHOCARDIOGRAM COMPLETE
HEIGHTINCHES: 65 in
WEIGHTICAEL: 1856 [oz_av]

## 2017-05-10 LAB — PROTIME-INR
INR: 0.99
Prothrombin Time: 13 seconds (ref 11.4–15.2)

## 2017-05-10 LAB — LIPID PANEL
CHOLESTEROL: 250 mg/dL — AB (ref 0–200)
HDL: 75 mg/dL (ref >40)
LDL CALC: 162 mg/dL — AB (ref 0–99)
Total CHOL/HDL Ratio: 3.3 RATIO
Triglycerides: 65 mg/dL (ref <150)
VLDL: 13 mg/dL (ref 0–40)

## 2017-05-10 LAB — MAGNESIUM: MAGNESIUM: 1.9 mg/dL (ref 1.7–2.4)

## 2017-05-10 LAB — APTT: APTT: 28 s (ref 24–36)

## 2017-05-10 MED ORDER — ENSURE ENLIVE PO LIQD
237.0000 mL | Freq: Two times a day (BID) | ORAL | Status: DC
  Administered 2017-05-10 – 2017-05-11 (×2): 237 mL via ORAL

## 2017-05-10 MED ORDER — PAROXETINE HCL 10 MG PO TABS
15.0000 mg | ORAL_TABLET | Freq: Every evening | ORAL | Status: DC
  Administered 2017-05-10: 15 mg via ORAL
  Filled 2017-05-10: qty 1.5

## 2017-05-10 MED ORDER — POLYVINYL ALCOHOL 1.4 % OP SOLN
1.0000 [drp] | Freq: Two times a day (BID) | OPHTHALMIC | Status: DC | PRN
  Filled 2017-05-10: qty 15

## 2017-05-10 MED ORDER — ACETAMINOPHEN 325 MG PO TABS
650.0000 mg | ORAL_TABLET | ORAL | Status: DC | PRN
  Administered 2017-05-11: 650 mg via ORAL
  Filled 2017-05-10: qty 2

## 2017-05-10 MED ORDER — CARBOXYMETHYLCELLULOSE SODIUM 0.5 % OP SOLN: 1.0000 [drp] | Freq: Two times a day (BID) | OPHTHALMIC | Status: DC | PRN

## 2017-05-10 MED ORDER — DEXTROSE-NACL 5-0.9 % IV SOLN
INTRAVENOUS | Status: DC
  Administered 2017-05-10 (×2): via INTRAVENOUS

## 2017-05-10 MED ORDER — ONDANSETRON HCL 4 MG/2ML IJ SOLN: 4.0000 mg | Freq: Four times a day (QID) | INTRAMUSCULAR | Status: DC | PRN

## 2017-05-10 MED ORDER — HYDROCODONE-ACETAMINOPHEN 5-325 MG PO TABS: 1.0000 | ORAL_TABLET | Freq: Four times a day (QID) | ORAL | Status: DC | PRN

## 2017-05-10 MED ORDER — ASPIRIN EC 81 MG PO TBEC
81.0000 mg | DELAYED_RELEASE_TABLET | Freq: Every day | ORAL | Status: DC
  Administered 2017-05-11: 81 mg via ORAL
  Filled 2017-05-10: qty 1

## 2017-05-10 MED ORDER — ENOXAPARIN SODIUM 40 MG/0.4ML ~~LOC~~ SOLN: 40.0000 mg | SUBCUTANEOUS | Status: DC

## 2017-05-10 MED ORDER — ATORVASTATIN CALCIUM 20 MG PO TABS
40.0000 mg | ORAL_TABLET | Freq: Every day | ORAL | Status: DC
  Administered 2017-05-10: 40 mg via ORAL
  Filled 2017-05-10 (×2): qty 2

## 2017-05-10 MED ORDER — BUPROPION HCL 75 MG PO TABS
75.0000 mg | ORAL_TABLET | Freq: Every morning | ORAL | Status: DC
  Administered 2017-05-10: 75 mg via ORAL
  Filled 2017-05-10 (×2): qty 1

## 2017-05-10 MED ORDER — HEPARIN (PORCINE) IN NACL 100-0.45 UNIT/ML-% IJ SOLN
700.0000 [IU]/h | INTRAMUSCULAR | Status: DC
  Administered 2017-05-10: 700 [IU]/h via INTRAVENOUS
  Filled 2017-05-10: qty 250

## 2017-05-10 MED ORDER — ASPIRIN 81 MG PO CHEW
324.0000 mg | CHEWABLE_TABLET | ORAL | Status: AC
  Administered 2017-05-10: 324 mg via ORAL
  Filled 2017-05-10: qty 4

## 2017-05-10 MED ORDER — LATANOPROST 0.005 % OP SOLN
1.0000 [drp] | Freq: Every day | OPHTHALMIC | Status: DC
  Administered 2017-05-10: 1 [drp] via OPHTHALMIC
  Filled 2017-05-10: qty 2.5

## 2017-05-10 MED ORDER — NITROGLYCERIN 0.4 MG SL SUBL: 0.4000 mg | SUBLINGUAL_TABLET | SUBLINGUAL | Status: DC | PRN

## 2017-05-10 MED ORDER — SODIUM CHLORIDE 0.9 % IV BOLUS (SEPSIS)
1000.0000 mL | Freq: Once | INTRAVENOUS | Status: AC
  Administered 2017-05-10: 1000 mL via INTRAVENOUS

## 2017-05-10 MED ORDER — FAMOTIDINE 20 MG PO TABS
20.0000 mg | ORAL_TABLET | Freq: Two times a day (BID) | ORAL | Status: DC
  Administered 2017-05-10 (×2): 20 mg via ORAL
  Filled 2017-05-10 (×3): qty 1

## 2017-05-10 MED ORDER — ASPIRIN 300 MG RE SUPP: 300.0000 mg | RECTAL | Status: AC

## 2017-05-10 MED ORDER — HEPARIN BOLUS VIA INFUSION
2500.0000 [IU] | Freq: Once | INTRAVENOUS | Status: AC
  Administered 2017-05-10: 2500 [IU] via INTRAVENOUS
  Filled 2017-05-10: qty 2500

## 2017-05-10 MED ORDER — CHLORDIAZEPOXIDE HCL 5 MG PO CAPS
5.0000 mg | ORAL_CAPSULE | Freq: Three times a day (TID) | ORAL | Status: DC
  Administered 2017-05-10 (×3): 5 mg via ORAL
  Filled 2017-05-10 (×4): qty 1

## 2017-05-10 MED ORDER — POTASSIUM CHLORIDE CRYS ER 20 MEQ PO TBCR
30.0000 meq | EXTENDED_RELEASE_TABLET | Freq: Once | ORAL | Status: AC
  Administered 2017-05-10: 30 meq via ORAL
  Filled 2017-05-10: qty 1

## 2017-05-10 NOTE — Progress Notes (Signed)
CRITICAL VALUE ALERT  Critical Value:  0.03  Date & Time Notied:  05/10/2017, 10:29am  Provider Notified: Christell Faith  Orders Received/Actions taken: MD already aware of this value, MD already rounded on this patient prior to notification.

## 2017-05-10 NOTE — H&P (Signed)
[facility] NAME: Christy Waters    MR#:  [id number]  DATE OF BIRTH:  1919-04-03  DATE OF ADMISSION:  05/10/2017  PRIMARY CARE PHYSICIAN: Rusty Aus, MD   REQUESTING/REFERRING PHYSICIAN:   CHIEF COMPLAINT:   Chief Complaint  Patient presents with  . Chest Pain    HISTORY OF PRESENT ILLNESS: Christy Waters  is a 83 y.o. female with a known history of arthritis, skin cancer, hyperlipidemia, osteoporosis, anemia presented to the emergency room with chest discomfort and jaw pain. Patient lives alone and independent in activities of daily living. She experienced mild chest discomfort which was 3 out of 10 on a scale of 1-10. She felt dizzy and also the pain radiated to the jaw. She was evaluated in the emergency room she was found to be in atrial fibrillation first set of troponin was negative. No complaints of any shortness of breath, orthopnea. No fever or chills and cough.  PAST MEDICAL HISTORY:   Past Medical History:  Diagnosis Date  . Anemia    distant past  . Arrhythmia    followed by PCP  . Arthritis    fingers, hips  . Cancer ([place])    skin cancer; some spots on left side of face; been 5 years since last treated;   . Headache   . Heart murmur    followed by PCP  . High cholesterol   . Hypercholesteremia   . Osteoporosis   . Skin cancer     PAST SURGICAL HISTORY:  Past Surgical History:  Procedure Laterality Date  . CATARACT EXTRACTION W/PHACO Left 09/14/2015   Procedure: CATARACT EXTRACTION PHACO AND INTRAOCULAR LENS PLACEMENT (IOC);  Surgeon: Ronnell Freshwater, MD;  Location: [facility];  Service: Ophthalmology;  Laterality: Left;  [place] Right    cataract surgery schedule for Easter Monday 2017;     SOCIAL HISTORY:  Social History   Tobacco Use  . Smoking status: Never Smoker  . Smokeless tobacco: Never Used  Substance Use Topics  . Alcohol use: No    FAMILY  HISTORY: History reviewed. No pertinent family history.  DRUG ALLERGIES:  Allergies  Allergen Reactions  . Azithromycin Shortness Of Breath and Other (See Comments)    Syncope Can not stand up, feels very sick, weak Syncope Syncope Can not stand up, feels very sick, weak Syncope Syncope Can not stand up, feels very sick, weak  . Clindamycin Other (See Comments)    GI problem  GI problem  GI problem   . Clindamycin/Lincomycin   . Hyoscyamine Diarrhea  . Lincomycin Other (See Comments)  . Maxitrol [Neomycin-Polymyxin-Dexameth]     Eye irritation  . Other Other (See Comments)    Eye irritation Eye irritation  . Timolol Other (See Comments)    Eye irritation Eye irritation Eye irritation  . Codeine Other (See Comments) and Nausea Only    Altered mental status FATIGUE, SICK FEELING Altered mental status Altered mental status FATIGUE, SICK FEELING  . Penicillins Rash    REVIEW OF SYSTEMS:   CONSTITUTIONAL: No fever, fatigue or weakness.  EYES: No blurred or double vision.  EARS, NOSE, AND THROAT: No tinnitus or ear pain.  RESPIRATORY: No cough, shortness of breath, wheezing or hemoptysis.  CARDIOVASCULAR: Has chest pain,  No orthopnea, edema.  GASTROINTESTINAL: No nausea, vomiting, diarrhea or abdominal pain.  GENITOURINARY: No dysuria, hematuria.  ENDOCRINE: No polyuria, nocturia,  HEMATOLOGY: No anemia, easy bruising  or bleeding SKIN: No rash or lesion. MUSCULOSKELETAL: No joint pain or arthritis.   NEUROLOGIC: No tingling, numbness, weakness.  Had dizziness PSYCHIATRY: No anxiety or depression.   MEDICATIONS AT HOME:  Prior to Admission medications   Medication Sig Start Date End Date Taking? Authorizing Provider  buPROPion (WELLBUTRIN) 75 MG tablet Take 75 mg by mouth every morning.   Yes [provider]  carboxymethylcellulose (REFRESH PLUS) 0.5 % SOLN Apply 1-2 drops to eye 2 (two) times daily as needed.    Yes [provider]   chlordiazePOXIDE (LIBRIUM) 5 MG capsule Take 5 mg by mouth 3 (three) times daily.    Yes [provider]  famotidine (PEPCID) 20 MG tablet Take 20 mg by mouth 2 (two) times daily.   Yes [provider]  HYDROcodone-acetaminophen (NORCO/VICODIN) 5-325 MG tablet Take 1 tablet by mouth every 6 (six) hours as needed for severe pain. 07/04/16  Yes Lavonia Drafts, MD  LUMIGAN 0.01 % SOLN Place 1 drop into both eyes at bedtime.    Yes [provider]  potassium chloride (KLOR-CON) 8 MEQ tablet Take 8 mEq by mouth 2 (two) times daily.   Yes [provider]  KLOR-CON M20 20 MEQ tablet Take 20 mEq by mouth daily. 05/23/16   [provider]  nepafenac (ILEVRO) 0.3 % ophthalmic suspension Apply to eye.    [provider]  PARoxetine (PAXIL) 10 MG tablet Take 15 mg by mouth every evening.     [provider]      PHYSICAL EXAMINATION:   VITAL SIGNS: Blood pressure (!) 148/86, resp. rate 18, height 5\' 5"  (1.651 m), weight 52.6 kg (116 lb), SpO2 99 %.  GENERAL:  83 y.o.-year-old elderly female patient lying in the bed with no acute distress.  EYES: Pupils equal, round, reactive to light and accommodation. No scleral icterus. Extraocular muscles intact.  HEENT: Head atraumatic, normocephalic. Oropharynx and nasopharynx clear.  NECK:  Supple, no jugular venous distention. No thyroid enlargement, no tenderness.  LUNGS: Normal breath sounds bilaterally, no wheezing, rales,rhonchi or crepitation. No use of accessory muscles of respiration.  CARDIOVASCULAR: S1, S2 normal. No murmurs, rubs, or gallops.  ABDOMEN: Soft, nontender, nondistended. Bowel sounds present. No organomegaly or mass.  EXTREMITIES: No pedal edema, cyanosis, or clubbing.  NEUROLOGIC: Cranial nerves II through XII are intact. Muscle strength 5/5 in all extremities. Sensation intact. Gait not checked.  PSYCHIATRIC: The patient is alert and oriented x 3.  SKIN: No obvious rash,  lesion, or ulcer.   LABORATORY PANEL:   CBC Recent Labs  Lab 05/10/17 0051  WBC 4.4  HGB 12.9  HCT 38.3  PLT 184  MCV 97.5  MCH 32.7  MCHC 33.5  RDW 14.0   ------------------------------------------------------------------------------------------------------------------  Chemistries  Recent Labs  Lab 05/10/17 0051  NA 140  K 4.0  CL 104  CO2 27  GLUCOSE 108*  BUN 25*  CREATININE 0.75  CALCIUM 9.1   ------------------------------------------------------------------------------------------------------------------ estimated creatinine clearance is 34.2 mL/min (by C-G formula based on SCr of 0.75 mg/dL). ------------------------------------------------------------------------------------------------------------------ No results for input(s): TSH, T4TOTAL, T3FREE, THYROIDAB in the last 72 hours.  Invalid input(s): FREET3   Coagulation profile No results for input(s): INR, PROTIME in the last 168 hours. ------------------------------------------------------------------------------------------------------------------- No results for input(s): DDIMER in the last 72 hours. -------------------------------------------------------------------------------------------------------------------  Cardiac Enzymes Recent Labs  Lab 05/10/17 0051  TROPONINI <0.03   ------------------------------------------------------------------------------------------------------------------ Invalid input(s): POCBNP  ---------------------------------------------------------------------------------------------------------------  Urinalysis    Component Value Date/Time   COLORURINE YELLOW (A)  06/30/2016 1431   APPEARANCEUR CLEAR (A) 06/30/2016 1431   APPEARANCEUR Hazy 07/13/2013 0212   LABSPEC 1.009 06/30/2016 1431   LABSPEC 1.015 07/13/2013 0212   PHURINE 6.0 06/30/2016 1431   GLUCOSEU NEGATIVE 06/30/2016 1431   GLUCOSEU 50 mg/dL 07/13/2013 0212   HGBUR SMALL (A) 06/30/2016 1431    BILIRUBINUR NEGATIVE 06/30/2016 1431   BILIRUBINUR Negative 07/13/2013 0212   KETONESUR NEGATIVE 06/30/2016 1431   PROTEINUR NEGATIVE 06/30/2016 1431   NITRITE NEGATIVE 06/30/2016 1431   LEUKOCYTESUR NEGATIVE 06/30/2016 1431   LEUKOCYTESUR Negative 07/13/2013 0212     RADIOLOGY: Dg Chest Port 1 View  Result Date: 05/10/2017 CLINICAL DATA:  Chest pain radiating to the jaw. Lightheadedness tonight. EXAM: PORTABLE CHEST 1 VIEW COMPARISON:  08/28/2010 FINDINGS: Marked hyperinflation, unchanged. Marked cardiomegaly, unchanged. No airspace consolidation. Normal pulmonary vasculature. Unremarkable hilar and mediastinal contours. No pleural effusions. IMPRESSION: Stable cardiomegaly and hyperinflation. No consolidation. Normal vasculature. Electronically Signed   By: Andreas Newport M.D.   On: 05/10/2017 01:43    EKG: Orders placed or performed during the hospital encounter of 05/10/17  . ED EKG  . ED EKG  . EKG 12-Lead  . EKG 12-Lead    IMPRESSION AND PLAN: 83 year old female patient with history of arthritis, hyperlipidemia, skin cancer, osteoporosis presented to the emergency room with chest discomfort, jaw pain.  Admitting diagnosis 1. Chest pain 2. Atrial fibrillation new onset 3. Hyperlipidemia 4. Arthritis 5. Osteoporosis Treatment plan Admit patient to telemetry observation bed Aspirin 81 mg daily Cycle troponin Check echocardiogram Cardiology evaluation Stress test as per recommendation from cardiology DVT prophylaxis subcutaneous Lovenox 40 MG daily  All the records are reviewed and case discussed with ED provider. Management plans discussed with the patient, family and they are in agreement.  CODE STATUS:FULL CODE Code Status History    This patient does not have a recorded code status. Please follow your organizational policy for patients in this situation.    Advance Directive Documentation     Most Recent Value  Type of Advance Directive  Living will,  Healthcare Power of Attorney  Pre-existing out of facility DNR order (yellow form or pink MOST form)  No data  "MOST" Form in Place?  No data       TOTAL TIME TAKING CARE OF THIS PATIENT: 50 minutes.    Saundra Shelling M.D on 05/10/2017 at 3:12 AM  Between 7am to 6pm - Pager - [phone]  After 6pm go to [email or web address] - password [email or web address] [facility]  Office  [phone]  CC: Primary care physician; Rusty Aus, MD

## 2017-05-10 NOTE — Care Management Obs Status (Signed)
[facility] NOTIFICATION   Patient Details  Name: Christy Waters MRN: [id number] Date of Birth: 23-Jul-1918   Medicare Observation Status Notification Given:  Yes    Katrina Stack, RN 05/10/2017, 10:09 AM

## 2017-05-10 NOTE — ED Provider Notes (Signed)
[facility] Emergency Department Provider Note   First MD Initiated Contact with Patient 05/10/17 [phone]     (approximate)  I have reviewed the triage vital signs and the nursing notes.   HISTORY  Chief Complaint Chest Pain    HPI  Christy Waters is a 83 y.o. female with below list of chronic medical conditions presents to the emergency department acute onset of left chest pain with radiation to the left jaw accompanied by diaphoresis dizziness with onset this morning.  Patient states current pain is "aching pressure".  Patient describes the pain is mild at this time.  Patient denies any lower externally pain or swelling.  Patient denies any cough or shortness of breath.   Past Medical History:  Diagnosis Date  . Anemia    distant past  . Arrhythmia    followed by PCP  . Arthritis    fingers, hips  . Cancer ([place])    skin cancer; some spots on left side of face; been 5 years since last treated;   . Headache   . Heart murmur    followed by PCP  . High cholesterol   . Hypercholesteremia   . Osteoporosis   . Skin cancer     Patient Active Problem List   Diagnosis Date Noted  . Chest pain 05/10/2017  . 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 Irritable bowel syndrome with diarrhea 05/20/2014  . Abdominal pain, generalized 04/01/2014  . Atherosclerosis of aorta ([place]) 03/05/2014  . 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 Idiopathic peripheral neuropathy 03/05/2014  . Abdominal pain of unknown cause 02/06/2014  . MI (mitral incompetence) 02/06/2014  . Stress incontinence 02/06/2014  . Fibrosis of lung ([place]) 10/03/2013  . Generalized OA 10/03/2013  . HLD (hyperlipidemia) 10/03/2013  . BP (high blood pressure) 10/03/2013  . OP (osteoporosis) 10/03/2013  . Beat, premature ventricular 10/03/2013    Past Surgical History:  Procedure Laterality Date  . CATARACT EXTRACTION W/PHACO Left 09/14/2015   Procedure: CATARACT EXTRACTION PHACO AND  INTRAOCULAR LENS PLACEMENT (IOC);  Surgeon: Ronnell Freshwater, MD;  Location: [facility];  Service: Ophthalmology;  Laterality: Left;  [place] Right    cataract surgery schedule for Easter Monday 2017;     Prior to Admission medications   Medication Sig Start Date End Date Taking? Authorizing Provider  buPROPion (WELLBUTRIN) 75 MG tablet Take 75 mg by mouth every morning.   Yes [provider]  carboxymethylcellulose (REFRESH PLUS) 0.5 % SOLN Apply 1-2 drops to eye 2 (two) times daily as needed.    Yes [provider]  chlordiazePOXIDE (LIBRIUM) 5 MG capsule Take 5 mg by mouth 3 (three) times daily.    Yes [provider]  famotidine (PEPCID) 20 MG tablet Take 20 mg by mouth 2 (two) times daily.   Yes [provider]  HYDROcodone-acetaminophen (NORCO/VICODIN) 5-325 MG tablet Take 1 tablet by mouth every 6 (six) hours as needed for severe pain. 07/04/16  Yes Lavonia Drafts, MD  LUMIGAN 0.01 % SOLN Place 1 drop into both eyes at bedtime.    Yes [provider]  potassium chloride (KLOR-CON) 8 MEQ tablet Take 8 mEq by mouth 2 (two) times daily.   Yes [provider]  KLOR-CON M20 20 MEQ tablet Take 20 mEq by mouth daily. 05/23/16   [provider]  nepafenac (ILEVRO) 0.3 % ophthalmic suspension Apply to eye.    [provider]  PARoxetine (PAXIL) 10 MG tablet Take  15 mg by mouth every evening.     [provider]    Allergies Azithromycin; Clindamycin; Clindamycin/lincomycin; Hyoscyamine; Lincomycin; Maxitrol [neomycin-polymyxin-dexameth]; Other; Timolol; Codeine; and Penicillins  History reviewed. No pertinent family history.  Social History Social History   Tobacco Use  . Smoking status: Never Smoker  . Smokeless tobacco: Never Used  Substance Use Topics  . Alcohol use: No  . Drug use: No    Review of Systems Constitutional: No fever/chills Eyes: No visual changes. ENT:  No sore throat. Cardiovascular: Positive for chest pain. Respiratory: Denies shortness of breath. Gastrointestinal: No abdominal pain.  No nausea, no vomiting.  No diarrhea.  No constipation. Genitourinary: Negative for dysuria. Musculoskeletal: Negative for neck pain.  Negative for back pain. Integumentary: Negative for rash. Neurological: Negative for headaches, focal weakness or numbness.   ____________________________________________   PHYSICAL EXAM:  VITAL SIGNS: ED Triage Vitals  Enc Vitals Group     BP 05/10/17 0048 (!) 148/86     Pulse Rate 05/10/17 0100 86     Resp 05/10/17 0048 18     Temp 05/10/17 0347 (!) 97.5 F (36.4 C)     Temp Source 05/10/17 0347 Oral     SpO2 05/10/17 0048 99 %     Weight 05/10/17 0049 52.6 kg (116 lb)     Height 05/10/17 0049 1.651 m (5\' 5" )     Head Circumference --      Peak Flow --      Pain Score 05/10/17 0047 0     Pain Loc --      Pain Edu? --      Excl. in [place]? --     Constitutional: Alert and oriented. Well appearing and in no acute distress. Eyes: Conjunctivae are normal. Head: Atraumatic. Mouth/Throat: Mucous membranes are moist. Oropharynx non-erythematous. Neck: No stridor.  Cardiovascular: Irregular irregular rhythm good peripheral circulation. Grossly normal heart sounds. Respiratory: Normal respiratory effort.  No retractions. Lungs CTAB. Gastrointestinal: Soft and nontender. No distention.  Musculoskeletal: No lower extremity tenderness nor edema. No gross deformities of extremities. Neurologic:  Normal speech and language. No gross focal neurologic deficits are appreciated.  Skin:  Skin is warm, dry and intact. No rash noted. Psychiatric: Mood and affect are normal. Speech and behavior are normal.  ____________________________________________   LABS (all labs ordered are listed, but only abnormal results are displayed)  Labs Reviewed  BASIC METABOLIC PANEL - Abnormal; Notable for the following components:       Result Value   Glucose, Bld 108 (*)    BUN 25 (*)    All other components within normal limits  TROPONIN I - Abnormal; Notable for the following components:   Troponin I 0.03 (*)    All other components within normal limits  BASIC METABOLIC PANEL - Abnormal; Notable for the following components:   Glucose, Bld 100 (*)    BUN 21 (*)    Calcium 8.4 (*)    All other components within normal limits  LIPID PANEL - Abnormal; Notable for the following components:   Cholesterol 250 (*)    LDL Cholesterol 162 (*)    All other components within normal limits  CBC - Abnormal; Notable for the following components:   WBC 3.5 (*)    RBC 3.57 (*)    Hemoglobin 11.8 (*)    HCT 34.4 (*)    All other components within normal limits  CBC  TROPONIN I  TROPONIN I  TROPONIN I   ____________________________________________  EKG ED ECG REPORT I, [place] N Benjy Kana, the attending physician, personally viewed and interpreted this ECG.   Date: 05/10/2017  EKG Time: 12:45 AM  Rate: 102  Rhythm: Atrial fibrillation with RVR  Axis: Normal  Intervals: Normal  ST&T Change: None  ____________________________________________  RADIOLOGY I, [place] N Lawsyn Heiler, personally viewed and evaluated these images (plain radiographs) as part of my medical decision making, as well as reviewing the written report by the radiologist.  Dg Chest Port 1 View  Result Date: 05/10/2017 CLINICAL DATA:  Chest pain radiating to the jaw. Lightheadedness tonight. EXAM: PORTABLE CHEST 1 VIEW COMPARISON:  08/28/2010 FINDINGS: Marked hyperinflation, unchanged. Marked cardiomegaly, unchanged. No airspace consolidation. Normal pulmonary vasculature. Unremarkable hilar and mediastinal contours. No pleural effusions. IMPRESSION: Stable cardiomegaly and hyperinflation. No consolidation. Normal vasculature. Electronically Signed   By: Andreas Newport M.D.   On: 05/10/2017 01:43      Procedures   ____________________________________________   INITIAL IMPRESSION / ASSESSMENT AND PLAN / ED COURSE  As part of my medical decision making, I reviewed the following data within the electronic MEDICAL RECORD NUMBER18 year old female presenting with above-stated history and physical exam of chest pain with radiation to left jaw accompanied by diaphoresis and dizziness.  Patient noted to to have atrial fibrillation which is new in onset.  Concern for possible cardiac etiology for the patient's chest discomfort EKG revealed no evidence of ischemia initial troponin negative.  However given market concern with multiple risk factors patient discussed with Dr. Estanislado Pandy for hospital admission for further evaluation and management ____________________________________________  FINAL CLINICAL IMPRESSION(S) / ED DIAGNOSES  Final diagnoses:  Chest pain, unspecified type  New onset atrial fibrillation ([place])     MEDICATIONS GIVEN DURING THIS VISIT:  Medications  buPROPion (WELLBUTRIN) tablet 75 mg (not administered)  chlordiazePOXIDE (LIBRIUM) capsule 5 mg (not administered)  famotidine (PEPCID) tablet 20 mg (not administered)  HYDROcodone-acetaminophen (NORCO/VICODIN) 5-325 MG per tablet 1 tablet (not administered)  latanoprost (XALATAN) 0.005 % ophthalmic solution 1 drop (not administered)  PARoxetine (PAXIL) tablet 15 mg (not administered)  aspirin EC tablet 81 mg (not administered)  nitroGLYCERIN (NITROSTAT) SL tablet 0.4 mg (not administered)  acetaminophen (TYLENOL) tablet 650 mg (not administered)  ondansetron (ZOFRAN) injection 4 mg (not administered)  enoxaparin (LOVENOX) injection 40 mg (not administered)  dextrose 5 %-0.9 % sodium chloride infusion ( Intravenous New Bag/Given 05/10/17 0515)  polyvinyl alcohol (LIQUIFILM TEARS) 1.4 % ophthalmic solution 1 drop (not administered)  sodium chloride 0.9 % bolus 1,000 mL (0 mLs Intravenous Stopped 05/10/17 0324)  aspirin  chewable tablet 324 mg (324 mg Oral Given 05/10/17 0514)    Or  aspirin suppository 300 mg ( Rectal See Alternative 05/10/17 0514)     ED Discharge Orders    None       Note:  This document was prepared using Dragon voice recognition software and may include unintentional dictation errors.    Gregor Hams, MD 05/10/17 [phone]

## 2017-05-10 NOTE — Progress Notes (Signed)
*  PRELIMINARY RESULTS* Echocardiogram 2D Echocardiogram has been performed.  Christy Waters 05/10/2017, 3:41 PM

## 2017-05-10 NOTE — ED Triage Notes (Signed)
Pt arrived via EMS from home. Ems reports chest pain that radiates to jaw, dizziness and lightheaded. EMS reports pt has no cardiac hx, EMS EKG afib. Pt alert, oriented, no obvious distress at present. Orthostatics taken by ems laying 128/70, standing 77/50.

## 2017-05-10 NOTE — Progress Notes (Signed)
CRITICAL VALUE ALERT  Critical Value:  0.03 Troponin  Date & Time Notied: 05/10/2017 16:33  Provider Notified: Tressia Miners  Orders Received/Actions taken: This is unchanged from earlier value, will await further orders

## 2017-05-10 NOTE — Progress Notes (Signed)
Christy Waters at [place] NAME: Christy Waters    MR#:  [id number]  DATE OF BIRTH:  06/15/1918  SUBJECTIVE:  CHIEF COMPLAINT:   Chief Complaint  Patient presents with  . Chest Pain   - Active 83 year old female admitted with jaw pain and chest pain. Noted to have new onset A. fib. Currently converted to sinus rhythm.  REVIEW OF SYSTEMS:  Review of Systems  Constitutional: Negative for chills, fever and malaise/fatigue.  HENT: Negative for congestion, ear discharge, hearing loss and nosebleeds.   Eyes: Negative for blurred vision and double vision.  Respiratory: Negative for cough, shortness of breath and wheezing.   Cardiovascular: Negative for chest pain, palpitations and leg swelling.  Gastrointestinal: Negative for abdominal pain, constipation, diarrhea, nausea and vomiting.  Genitourinary: Negative for dysuria.  Musculoskeletal: Negative for myalgias.  Neurological: Negative for dizziness, speech change, focal weakness, seizures and headaches.  Psychiatric/Behavioral: Negative for depression.    DRUG ALLERGIES:   Allergies  Allergen Reactions  . Azithromycin Shortness Of Breath and Other (See Comments)    Syncope Can not stand up, feels very sick, weak Syncope Syncope Can not stand up, feels very sick, weak Syncope Syncope Can not stand up, feels very sick, weak  . Clindamycin Other (See Comments)    GI problem  GI problem  GI problem   . Clindamycin/Lincomycin   . Hyoscyamine Diarrhea  . Lincomycin Other (See Comments)  . Maxitrol [Neomycin-Polymyxin-Dexameth]     Eye irritation  . Other Other (See Comments)    Eye irritation Eye irritation  . Timolol Other (See Comments)    Eye irritation Eye irritation Eye irritation  . Codeine Other (See Comments) and Nausea Only    Altered mental status FATIGUE, SICK FEELING Altered mental status Altered mental status FATIGUE, SICK FEELING  . Penicillins Rash    VITALS:   Blood pressure 131/64, pulse (!) 55, temperature 97.7 F (36.5 C), temperature source Oral, resp. rate 16, height 5\' 5"  (1.651 m), weight 52.6 kg (116 lb), SpO2 93 %.  PHYSICAL EXAMINATION:  Physical Exam  \GENERAL:  83 y.o.-year-old patient lying in the bed with no acute distress.  EYES: Pupils equal, round, reactive to light and accommodation. No scleral icterus. Extraocular muscles intact.  HEENT: Head atraumatic, normocephalic. Oropharynx and nasopharynx clear.  NECK:  Supple, no jugular venous distention. No thyroid enlargement, no tenderness.  LUNGS: Normal breath sounds bilaterally, no wheezing, rales,rhonchi or crepitation. No use of accessory muscles of respiration.  CARDIOVASCULAR: S1, S2 normal. No rubs, or gallops. 3/6 systolic murmur present  ABDOMEN: Soft, nontender, nondistended. Bowel sounds present. No organomegaly or mass.  EXTREMITIES: No pedal edema, cyanosis, or clubbing.  NEUROLOGIC: Cranial nerves II through XII are intact. Muscle strength 5/5 in all extremities. Sensation intact. Gait not checked.  PSYCHIATRIC: The patient is alert and oriented x 3.  SKIN: No obvious rash, lesion, or ulcer.    LABORATORY PANEL:   CBC Recent Labs  Lab 05/10/17 0412  WBC 3.5*  HGB 11.8*  HCT 34.4*  PLT 164   ------------------------------------------------------------------------------------------------------------------  Chemistries  Recent Labs  Lab 05/10/17 0412  NA 140  K 3.7  CL 108  CO2 26  GLUCOSE 100*  BUN 21*  CREATININE 0.62  CALCIUM 8.4*  MG 1.9   ------------------------------------------------------------------------------------------------------------------  Cardiac Enzymes Recent Labs  Lab 05/10/17 0355  TROPONINI 0.03*   ------------------------------------------------------------------------------------------------------------------  RADIOLOGY:  Dg Chest Port 1 View  Result Date: 05/10/2017 CLINICAL DATA:  Chest pain radiating to the  jaw. Lightheadedness tonight. EXAM: PORTABLE CHEST 1 VIEW COMPARISON:  08/28/2010 FINDINGS: Marked hyperinflation, unchanged. Marked cardiomegaly, unchanged. No airspace consolidation. Normal pulmonary vasculature. Unremarkable hilar and mediastinal contours. No pleural effusions. IMPRESSION: Stable cardiomegaly and hyperinflation. No consolidation. Normal vasculature. Electronically Signed   By: Andreas Newport M.D.   On: 05/10/2017 01:43    EKG:   Orders placed or performed during the hospital encounter of 05/10/17  . ED EKG  . ED EKG  . EKG 12-Lead  . EKG 12-Lead    ASSESSMENT AND PLAN:   83 year old female with past medical history significant for IBS with chronic abdominal pain, hyperlipidemia, depression and anxiety presents to hospital secondary to jaw pain/chest pain.  #1 new onset atrial fibrillation-could have caused her symptoms. -Currently converted to normal sinus rhythm. - Heart rate varying from 50s to 60s, so hold off on beta blockers. -Aspirin for anticoagulation now  2. Chest pain-t could be related to afib, none now - trend troponins, myoview tomorrow - appreciate cards consult - on asa - elevated LDL, consider statin   3. Depression and anxiety-continue Paxil and Wellbutrin  4. GERD-on Pepcid  5. DVT prophylaxis-on Lovenox   All the records are reviewed and case discussed with Care Management/Social Workerr. Management plans discussed with the patient, family and they are in agreement.  CODE STATUS: Full Code  TOTAL TIME TAKING CARE OF THIS PATIENT: 39 minutes.   POSSIBLE D/C IN 1-2  DAYS, DEPENDING ON CLINICAL CONDITION.   Gladstone Lighter M.D on 05/10/2017 at 10:29 AM  Between 7am to 6pm - Pager - [phone]  After 6pm go to [email or web address] - password [email or web address]  Office  [phone]  CC: Primary care physician; Rusty Aus, MD

## 2017-05-10 NOTE — Plan of Care (Signed)
  Progressing Education: Knowledge of General Education information will improve 05/10/2017 0433 - Progressing by Marylouise Stacks, RN Health Behavior/Discharge Planning: Ability to manage health-related needs will improve 05/10/2017 0433 - Progressing by Marylouise Stacks, RN Clinical Measurements: Ability to maintain clinical measurements within normal limits will improve 05/10/2017 0433 - Progressing by Marylouise Stacks, RN Will remain free from infection 05/10/2017 0433 - Progressing by Marylouise Stacks, RN Diagnostic test results will improve 05/10/2017 0433 - Progressing by Marylouise Stacks, RN Cardiovascular complication will be avoided 05/10/2017 0433 - Progressing by Marylouise Stacks, RN Activity: Risk for activity intolerance will decrease 05/10/2017 0433 - Progressing by Marylouise Stacks, RN Nutrition: Adequate nutrition will be maintained 05/10/2017 0433 - Progressing by Marylouise Stacks, RN Elimination: Will not experience complications related to bowel motility 05/10/2017 0433 - Progressing by Marylouise Stacks, RN Will not experience complications related to urinary retention 05/10/2017 0433 - Progressing by Marylouise Stacks, RN Pain Managment: General experience of comfort will improve 05/10/2017 0433 - Progressing by Marylouise Stacks, RN Safety: Ability to remain free from injury will improve 05/10/2017 0433 - Progressing by Marylouise Stacks, RN Skin Integrity: Risk for impaired skin integrity will decrease 05/10/2017 0433 - Progressing by Marylouise Stacks, RN

## 2017-05-10 NOTE — Consult Note (Addendum)
ANTICOAGULATION CONSULT NOTE - Initial Consult  Pharmacy Consult for heparin drip  Indication: atrial fibrillation and possible left atrial thrombus noted on echo  Allergies  Allergen Reactions  . Azithromycin Shortness Of Breath and Other (See Comments)    Syncope Can not stand up, feels very sick, weak Syncope Syncope Can not stand up, feels very sick, weak Syncope Syncope Can not stand up, feels very sick, weak  . Clindamycin Other (See Comments)    GI problem  GI problem  GI problem   . Clindamycin/Lincomycin   . Hyoscyamine Diarrhea  . Lincomycin Other (See Comments)  . Maxitrol [Neomycin-Polymyxin-Dexameth]     Eye irritation  . Other Other (See Comments)    Eye irritation Eye irritation  . Timolol Other (See Comments)    Eye irritation Eye irritation Eye irritation  . Codeine Other (See Comments) and Nausea Only    Altered mental status FATIGUE, SICK FEELING Altered mental status Altered mental status FATIGUE, SICK FEELING  . Penicillins Rash    Patient Measurements: Height: 5\' 5"  (165.1 cm) Weight: 116 lb (52.6 kg) IBW/kg (Calculated) : 57  Vital Signs: Temp: 97.7 F (36.5 C) (12/12 0801) Temp Source: Oral (12/12 0801) BP: 131/64 (12/12 0801) Pulse Rate: 55 (12/12 0801)  Labs: Recent Labs    05/10/17 0051 05/10/17 0355 05/10/17 0412 05/10/17 1029 05/10/17 1541  HGB 12.9  --  11.8*  --   --   HCT 38.3  --  34.4*  --   --   PLT 184  --  164  --   --   CREATININE 0.75  --  0.62  --   --   TROPONINI <0.03 0.03*  --  0.03* 0.03*    Estimated Creatinine Clearance: 34.2 mL/min (by C-G formula based on SCr of 0.62 mg/dL).   Medical History: Past Medical History:  Diagnosis Date  . Anemia    distant past  . Arthritis    fingers, hips  . Cancer ([place])    skin cancer; some spots on left side of face; been 5 years since last treated;   . Headache   . History of echocardiogram    a. TTE 04/06/17: EF > 55%, no RWMA, no sig valvular disease  .  HLD (hyperlipidemia)   . Hypercholesteremia   . Osteoporosis   . PAF (paroxysmal atrial fibrillation) ([place])    a. diagnosed 05/10/2017; b. CHADS2VASc => 3 (age x 2, female)     Assessment: Pharmacy consulted for heparin dosing and monitoring in 83 yo female with atrial fibrillation and possible left atrial thrombus noted on echo.  Goal of Therapy:  Heparin level 0.3-0.7 units/ml Monitor platelets by anticoagulation protocol: Yes   Plan:  Baseline labs ordered Give 2500 units bolus x 1 Start heparin infusion at 700 units/hr Check anti-Xa level in 8 hours and daily while on heparin Continue to monitor H&H and platelets  Pernell Dupre, PharmD, BCPS Clinical Pharmacist 05/10/2017 4:46 PM    12/13 0200 heparin level 0.42. Continue current regimen. Recheck in 8 hours to confirm.  Sim Boast, PharmD, BCPS  05/11/17 4:02 AM

## 2017-05-10 NOTE — Consult Note (Signed)
Cardiology Consultation:   Patient ID: Christy Waters; [id number]; 05-28-1919   Admit date: 05/10/2017 Date of Consult: 05/10/2017  Primary Care Provider: Rusty Aus, MD Primary Cardiologist: New to [facility] - consult by [place]   Patient Profile:   Christy Waters is a 83 y.o. female with a hx of IBS with chronic abdominal pain, generalized fatigue, HLD, depression, and anxiety who is being seen today for the evaluation of new onset Afib and elevated troponin at the request of Dr. Andrey Farmer.  History of Present Illness:   Christy Waters has no previously known cardiac disease. She recently underwent echo by PCP on 04/06/2017 for generalized fatigue that was essentially normal. She was in her usual state of health on 12/11. She was actually feeling better on 12/11 than she had in a while. She got up and rearranged some furniture without any issues. She went to bed on 12/11 feeling well. She was woken up overnight, though she does not know why. Upon waking up, she noted her the left side of her jaw was aching and this pain radiated down to her left shoulder and into the left upper chest. Pain was a dull ache. Pain lasted < 60 minutes and self resolved. There was some associated diaphoresis. There was no associated SOB, nausea, vomiting, palpitations, dizziness, presyncope, or syncope. Because of her jaw pain she presented to [facility] overnight.   Upon the patient's arrival to [facility] they were found to be in new onset Afib with RVR with heart rates in the 110s bpm. BP stable. EKG showed Afib as below, CXR showed stable cardiomegaly without acute process. Labs showed troponin <0.03-->0.03, WBC 4.4-->3.5, HGB 12.9-->11.8, PLT 184-->164, Na 140, K+ 4.0, BUN 25, Scr 0.75, glucose 108. She was given full dose ASA in the ED. She converted to sinus rhythm overnight. She is currently symptom free.   Past Medical History:  Diagnosis Date  . Anemia    distant past  . Arthritis    fingers,  hips  . Cancer ([place])    skin cancer; some spots on left side of face; been 5 years since last treated;   . Headache   . History of echocardiogram    a. TTE 04/06/17: EF > 55%, no RWMA, no sig valvular disease  . HLD (hyperlipidemia)   . Hypercholesteremia   . Osteoporosis   . PAF (paroxysmal atrial fibrillation) ([place])    a. diagnosed 05/10/2017; b. CHADS2VASc => 3 (age x 2, female)    Past Surgical History:  Procedure Laterality Date  . CATARACT EXTRACTION W/PHACO Left 09/14/2015   Procedure: CATARACT EXTRACTION PHACO AND INTRAOCULAR LENS PLACEMENT (IOC);  Surgeon: Ronnell Freshwater, MD;  Location: [place];  Service: Ophthalmology;  Laterality: Left;  [place] Right    cataract surgery schedule for Easter Monday 2017;      Home Meds: Prior to Admission medications   Medication Sig Start Date End Date Taking? Authorizing Provider  buPROPion (WELLBUTRIN) 75 MG tablet Take 75 mg by mouth every morning.   Yes [provider]  carboxymethylcellulose (REFRESH PLUS) 0.5 % SOLN Apply 1-2 drops to eye 2 (two) times daily as needed.    Yes [provider]  chlordiazePOXIDE (LIBRIUM) 5 MG capsule Take 5 mg by mouth 3 (three) times daily.    Yes [provider]  famotidine (PEPCID) 20 MG tablet Take 20 mg by mouth 2 (two) times daily.   Yes [provider]  HYDROcodone-acetaminophen (  NORCO/VICODIN) 5-325 MG tablet Take 1 tablet by mouth every 6 (six) hours as needed for severe pain. 07/04/16  Yes Lavonia Drafts, MD  LUMIGAN 0.01 % SOLN Place 1 drop into both eyes at bedtime.    Yes [provider]  potassium chloride (KLOR-CON) 8 MEQ tablet Take 8 mEq by mouth 2 (two) times daily.   Yes [provider]  KLOR-CON M20 20 MEQ tablet Take 20 mEq by mouth daily. 05/23/16   [provider]  nepafenac (ILEVRO) 0.3 % ophthalmic suspension Apply to eye.    [provider]  PARoxetine (PAXIL) 10 MG tablet  Take 15 mg by mouth every evening.     [provider]    Inpatient Medications: Scheduled Meds: . [START ON 05/11/2017] aspirin EC  81 mg Oral Daily  . buPROPion  75 mg Oral q morning - 10a  . chlordiazePOXIDE  5 mg Oral TID  . enoxaparin (LOVENOX) injection  40 mg Subcutaneous Q24H  . famotidine  20 mg Oral BID  . latanoprost  1 drop Both Eyes QHS  . PARoxetine  15 mg Oral QPM  . potassium chloride  30 mEq Oral Once   Continuous Infusions: . dextrose 5 % and 0.9% NaCl 50 mL/hr at 05/10/17 0515   PRN Meds: acetaminophen, HYDROcodone-acetaminophen, nitroGLYCERIN, ondansetron (ZOFRAN) IV, polyvinyl alcohol  Allergies:   Allergies  Allergen Reactions  . Azithromycin Shortness Of Breath and Other (See Comments)    Syncope Can not stand up, feels very sick, weak Syncope Syncope Can not stand up, feels very sick, weak Syncope Syncope Can not stand up, feels very sick, weak  . Clindamycin Other (See Comments)    GI problem  GI problem  GI problem   . Clindamycin/Lincomycin   . Hyoscyamine Diarrhea  . Lincomycin Other (See Comments)  . Maxitrol [Neomycin-Polymyxin-Dexameth]     Eye irritation  . Other Other (See Comments)    Eye irritation Eye irritation  . Timolol Other (See Comments)    Eye irritation Eye irritation Eye irritation  . Codeine Other (See Comments) and Nausea Only    Altered mental status FATIGUE, SICK FEELING Altered mental status Altered mental status FATIGUE, SICK FEELING  . Penicillins Rash    Social History:   Social History   Socioeconomic History  . Marital status: Widowed    Spouse name: Not on file  . Number of children: Not on file  . Years of education: Not on file  . Highest education level: Not on file  Social Needs  . Financial resource strain: Not on file  . Food insecurity - worry: Not on file  . Food insecurity - inability: Not on file  . Transportation needs - medical: Not on file  . Transportation needs -  non-medical: Not on file  Occupational History  . Not on file  Tobacco Use  . Smoking status: Never Smoker  . Smokeless tobacco: Never Used  Substance and Sexual Activity  . Alcohol use: No  . Drug use: No  . Sexual activity: No    Birth control/protection: None  Other Topics Concern  . Not on file  Social History Narrative  . Not on file     Family History:  Family History  Problem Relation Age of Onset  . CAD Mother   . Arrhythmia Mother   . Liver cancer Father     ROS:  Review of Systems  Constitutional: Positive for malaise/fatigue. Negative for chills, diaphoresis, fever and weight loss.  HENT:  Negative for congestion.        Tooth pain  Eyes: Negative for discharge and redness.  Respiratory: Negative for cough, hemoptysis, sputum production, shortness of breath and wheezing.   Cardiovascular: Positive for chest pain. Negative for palpitations, orthopnea, claudication, leg swelling and PND.  Gastrointestinal: Positive for constipation and diarrhea. Negative for abdominal pain, blood in stool, heartburn, melena, nausea and vomiting.  Genitourinary: Negative for hematuria.  Musculoskeletal: Negative for falls and myalgias.  Skin: Negative for rash.  Neurological: Negative for dizziness, tingling, tremors, sensory change, speech change, focal weakness, loss of consciousness and weakness.  Endo/Heme/Allergies: Does not bruise/bleed easily.  Psychiatric/Behavioral: Negative for substance abuse. The patient is not nervous/anxious.   All other systems reviewed and are negative.     Physical Exam/Data:   Vitals:   05/10/17 0230 05/10/17 0300 05/10/17 0347 05/10/17 0801  BP: 127/73 (!) 152/87 (!) 160/75 131/64  Pulse: (!) 58 (!) 57 62 (!) 55  Resp: (!) 21 (!) 25 18 16   Temp:   (!) 97.5 F (36.4 C) 97.7 F (36.5 C)  TempSrc:   Oral Oral  SpO2: 99% 97% 99% 93%  Weight:      Height:        Intake/Output Summary (Last 24 hours) at 05/10/2017 0831 Last data filed at  05/10/2017 0802 Gross per 24 hour  Intake 1100 ml  Output 200 ml  Net 900 ml   Filed Weights   05/10/17 0049  Weight: 116 lb (52.6 kg)   Body mass index is 19.3 kg/m.   Physical Exam: General: Frail appearing, in no acute distress. Head: Normocephalic, atraumatic, sclera non-icteric, no xanthomas, nares without discharge.  Neck: Negative for carotid bruits. JVD not elevated. Lungs: Clear bilaterally to auscultation without wheezes, rales, or rhonchi. Breathing is unlabored. Heart: RRR with S1 S2. No murmurs, rubs, or gallops appreciated. Abdomen: Soft, non-tender, non-distended with normoactive bowel sounds. No hepatomegaly. No rebound/guarding. No obvious abdominal masses. Msk:  Strength and tone appear normal for age. Extremities: No clubbing or cyanosis. No edema. Distal pedal pulses are 2+ and equal bilaterally. Neuro: Alert and oriented X 3. No facial asymmetry. No focal deficit. Moves all extremities spontaneously. Psych:  Responds to questions appropriately with a normal affect.   EKG:  The EKG was personally reviewed and demonstrates: Afib, 102 bpm, left axis deviation, left anterior fascicular block, poor R wave progression, nonspecific st/t changes Telemetry:  Telemetry was personally reviewed and demonstrates: currently in sinus rhythm with heart rates in the low 60s to upper 50s bpm. Converted from Afib to NSR overnight  Weights: Filed Weights   05/10/17 0049  Weight: 116 lb (52.6 kg)    Relevant CV Studies: TTE 04/06/2017: ECHOCARDIOGRAPHIC DESCRIPTIONS  AORTIC ROOT Size:Normal Dissection:INDETERM FOR DISSECTION  AORTIC VALVE Leaflets:Tricuspid Morphology:Normal Mobility:Fully mobile  LEFT VENTRICLE Size:NormalAnterior:Normal  Contraction:Normal Lateral:Normal Closest EF:>55% (Estimated)Septal:Normal  LV Masses:No Masses Apical:Normal  PJK:DTOI  LVHInferior:Normal Posterior:Normal Dias.FxClass:(Grade 1) relaxation abnormal, E/A reversal  MITRAL VALVE Leaflets:NormalMobility:Fully mobile Morphology:ANNULAR CALC  LEFT ATRIUM Size:MILDLY ENLARGEDLA Masses:No masses  IA Septum:Normal IAS  MAIN PA Size:Normal  PULMONIC VALVE Morphology:NormalMobility:Fully mobile  RIGHT VENTRICLE  RV Masses:No Masses Size:MODERATELY ENLARGED  Free Wall:Normal Contraction:MOD GLOBAL DECREASE  TRICUSPID VALVE Leaflets:NormalMobility:Fully mobile Morphology:Normal  RIGHT ATRIUM Size:NormalRA Other:None  RA Mass:No masses  PERICARDIUM  Fluid:No effusion  INFERIOR VENACAVA Size:Normal Normal respiratory collapse   _____________________________________________________________________ DOPPLER ECHO and OTHER SPECIAL PROCEDURES  Aortic:MILD ARNo AS   Mitral:MILD MRNo MS 161.7 cm/sec peak vel10.5 mmHg peak grad  3.1 mmHg mean grad MV Inflow E Vel=110.0 cm/sec MV Annulus E'Vel=4.0 cm/sec E/E'Ratio=27.3  Tricuspid:SEVERE TRNo TS 348.4 cm/sec peak TR vel 51.6 mmHg peak RV pressure  Pulmonary:MILD PRNo PS  Laboratory Data:  Chemistry Recent Labs  Lab 05/10/17 0051 05/10/17 0412  NA 140 140  K 4.0 3.7  CL 104 108  CO2 27 26  GLUCOSE 108* 100*  BUN 25* 21*  CREATININE 0.75 0.62  CALCIUM 9.1 8.4*  GFRNONAA >60 >60  GFRAA >60 >60  ANIONGAP 9 6    No results for input(s): PROT, ALBUMIN, AST, ALT, ALKPHOS, BILITOT in the last 168 hours. Hematology Recent Labs  Lab 05/10/17 0051 05/10/17 0412  WBC 4.4 3.5*  RBC 3.93 3.57*  HGB 12.9 11.8*  HCT 38.3 34.4*  MCV 97.5 96.3    MCH 32.7 32.9  MCHC 33.5 34.2  RDW 14.0 13.7  PLT 184 164   Cardiac Enzymes Recent Labs  Lab 05/10/17 0051 05/10/17 0355  TROPONINI <0.03 0.03*   No results for input(s): TROPIPOC in the last 168 hours.  BNPNo results for input(s): BNP, PROBNP in the last 168 hours.  DDimer No results for input(s): DDIMER in the last 168 hours.  Radiology/Studies:  Dg Chest Port 1 View  Result Date: 05/10/2017 IMPRESSION: Stable cardiomegaly and hyperinflation. No consolidation. Normal vasculature. Electronically Signed   By: Andreas Newport M.D.   On: 05/10/2017 01:43    Assessment and Plan:   1. New onset Afib: -Uncertain duration or frequency -Converted to sinus rhythm overnight and has remained in sinus rhythm with mildly bradycardic rates -Currently rate controlled without medications. Continue to monitor and start Lopressor if needed -CHADS2VASc at least 3 (age x 2, sex category) -Would recommend long term, full-dose anticoagulation. She is somewhat hesitant to start anticoagulation at this time. She is aware of the stroke risk. Given she is hesitant to start anticoagulation at this time, monitor on telemetry for Afib burden and plan for outpatient cardiac monitoring to evaluate for Afib burden -Replete potassium to goal > 4.0 -Check magnesium and tsh  2. Jaw pain/elevated troponin: -Has been symptom-free since her arrival to the ED -Initial troponin negative followed by minimally elevated at 0.03 at 4 AM -Continue to cycle until peak or flat trend is noted -If there is no dynamic elevation in troponin, this is likely supply demand ischemia in the setting of Afib with RVR -Check echo to evaluate EF and wall motion -If normal echo, consider Lexiscan Myoview to evaluate for high-risk ischemia given her symptoms  3. HLD: -LDL 162 -Given advanced age, it is reasonable to defer statin at this time  4. Hyperglycemia: -Check A1c  5. Depression: -Has been worse lately -Per  IM   For questions or updates, please contact [place] Please consult [email or web address] for contact info under Cardiology/STEMI.   Signed, Christell Faith, PA-C [place] Pager: [phone] 05/10/2017, 8:31 AM

## 2017-05-11 ENCOUNTER — Observation Stay (HOSPITAL_BASED_OUTPATIENT_CLINIC_OR_DEPARTMENT_OTHER): Payer: Medicare Other

## 2017-05-11 ENCOUNTER — Encounter: Payer: Self-pay | Admitting: Radiology

## 2017-05-11 DIAGNOSIS — I361 Nonrheumatic tricuspid (valve) insufficiency: Secondary | ICD-10-CM

## 2017-05-11 DIAGNOSIS — E782 Mixed hyperlipidemia: Secondary | ICD-10-CM

## 2017-05-11 DIAGNOSIS — I4891 Unspecified atrial fibrillation: Secondary | ICD-10-CM | POA: Diagnosis not present

## 2017-05-11 DIAGNOSIS — R079 Chest pain, unspecified: Secondary | ICD-10-CM

## 2017-05-11 LAB — CBC
HEMATOCRIT: 37.3 % (ref 35.0–47.0)
HEMOGLOBIN: 12.6 g/dL (ref 12.0–16.0)
MCH: 32.6 pg (ref 26.0–34.0)
MCHC: 33.8 g/dL (ref 32.0–36.0)
MCV: 96.5 fL (ref 80.0–100.0)
Platelets: 182 10*3/uL (ref 150–440)
RBC: 3.87 MIL/uL (ref 3.80–5.20)
RDW: 13.8 % (ref 11.5–14.5)
WBC: 3.4 10*3/uL — ABNORMAL LOW (ref 3.6–11.0)

## 2017-05-11 LAB — BASIC METABOLIC PANEL
Anion gap: 6 (ref 5–15)
BUN: 15 mg/dL (ref 6–20)
CHLORIDE: 108 mmol/L (ref 101–111)
CO2: 25 mmol/L (ref 22–32)
CREATININE: 0.6 mg/dL (ref 0.44–1.00)
Calcium: 8.3 mg/dL — ABNORMAL LOW (ref 8.9–10.3)
GFR calc Af Amer: >60 mL/min (ref >60)
GFR calc non Af Amer: >60 mL/min (ref >60)
GLUCOSE: 102 mg/dL — AB (ref 65–99)
POTASSIUM: 3.5 mmol/L (ref 3.5–5.1)
Sodium: 139 mmol/L (ref 135–145)

## 2017-05-11 LAB — HEPARIN LEVEL (UNFRACTIONATED)
Heparin Unfractionated: 0.42 IU/mL (ref 0.30–0.70)
Heparin Unfractionated: <0.1 IU/mL — ABNORMAL LOW (ref 0.30–0.70)

## 2017-05-11 MED ORDER — TECHNETIUM TC 99M TETROFOSMIN IV KIT
13.0000 | PACK | Freq: Once | INTRAVENOUS | Status: AC | PRN
  Administered 2017-05-11: 13.08 via INTRAVENOUS

## 2017-05-11 MED ORDER — REGADENOSON 0.4 MG/5ML IV SOLN: 0.4000 mg | Freq: Once | INTRAVENOUS | Status: DC

## 2017-05-11 MED ORDER — DULOXETINE HCL 20 MG PO CPEP
20.0000 mg | ORAL_CAPSULE | Freq: Every day | ORAL | Status: DC
  Administered 2017-05-11: 20 mg via ORAL
  Filled 2017-05-11: qty 1

## 2017-05-11 MED ORDER — ENSURE ENLIVE PO LIQD: 237.0000 mL | Freq: Two times a day (BID) | ORAL | 12 refills | Status: DC

## 2017-05-11 MED ORDER — APIXABAN 2.5 MG PO TABS: 2.5000 mg | ORAL_TABLET | Freq: Two times a day (BID) | ORAL | 1 refills | Status: DC

## 2017-05-11 MED ORDER — APIXABAN 2.5 MG PO TABS
2.5000 mg | ORAL_TABLET | Freq: Two times a day (BID) | ORAL | Status: DC
  Administered 2017-05-11: 2.5 mg via ORAL
  Filled 2017-05-11: qty 1

## 2017-05-11 MED ORDER — APIXABAN 2.5 MG PO TABS: 2.5000 mg | ORAL_TABLET | Freq: Two times a day (BID) | ORAL | Status: DC

## 2017-05-11 MED ORDER — REGADENOSON 0.4 MG/5ML IV SOLN
0.4000 mg | Freq: Once | INTRAVENOUS | Status: DC
  Filled 2017-05-11: qty 5

## 2017-05-11 MED ORDER — TECHNETIUM TC 99M TETROFOSMIN IV KIT
28.9000 | PACK | Freq: Once | INTRAVENOUS | Status: AC | PRN
  Administered 2017-05-11: 28.9 via INTRAVENOUS

## 2017-05-11 MED ORDER — CHLORDIAZEPOXIDE HCL 5 MG PO CAPS: 5.0000 mg | ORAL_CAPSULE | Freq: Two times a day (BID) | ORAL | Status: DC

## 2017-05-11 NOTE — Consult Note (Deleted)
ANTICOAGULATION CONSULT NOTE - Initial Consult  Pharmacy Consult for heparin drip  Indication: atrial fibrillation and possible left atrial thrombus noted on echo  Allergies  Allergen Reactions  . Azithromycin Shortness Of Breath and Other (See Comments)    Syncope Can not stand up, feels very sick, weak Syncope Syncope Can not stand up, feels very sick, weak Syncope Syncope Can not stand up, feels very sick, weak  . Clindamycin Other (See Comments)    GI problem  GI problem  GI problem   . Clindamycin/Lincomycin   . Hyoscyamine Diarrhea  . Lincomycin Other (See Comments)  . Maxitrol [Neomycin-Polymyxin-Dexameth]     Eye irritation  . Other Other (See Comments)    Eye irritation Eye irritation  . Timolol Other (See Comments)    Eye irritation Eye irritation Eye irritation  . Codeine Other (See Comments) and Nausea Only    Altered mental status FATIGUE, SICK FEELING Altered mental status Altered mental status FATIGUE, SICK FEELING  . Penicillins Rash    Patient Measurements: Height: 5\' 5"  (165.1 cm) Weight: 116 lb (52.6 kg) IBW/kg (Calculated) : 57  Vital Signs: Temp: 97.8 F (36.6 C) (12/13 0432) Temp Source: Oral (12/13 0432) BP: 139/58 (12/13 0432) Pulse Rate: 62 (12/13 0432)  Labs: Recent Labs    05/10/17 0051 05/10/17 0355 05/10/17 0412 05/10/17 1029 05/10/17 1541 05/10/17 1650 05/11/17 0207 05/11/17 1107  HGB 12.9  --  11.8*  --   --   --   --  12.6  HCT 38.3  --  34.4*  --   --   --   --  37.3  PLT 184  --  164  --   --   --   --  182  APTT  --   --   --   --   --  28  --   --   LABPROT  --   --   --   --   --  13.0  --   --   INR  --   --   --   --   --  0.99  --   --   HEPARINUNFRC  --   --   --   --   --   --  0.42 <0.10*  CREATININE 0.75  --  0.62  --   --   --  0.60  --   TROPONINI <0.03 0.03*  --  0.03* 0.03*  --   --   --     Estimated Creatinine Clearance: 34.2 mL/min (by C-G formula based on SCr of 0.6 mg/dL).   Medical  History: Past Medical History:  Diagnosis Date  . Anemia    distant past  . Arthritis    fingers, hips  . Cancer ([place])    skin cancer; some spots on left side of face; been 5 years since last treated;   . Headache   . History of echocardiogram    a. TTE 04/06/17: EF > 55%, no RWMA, no sig valvular disease  . HLD (hyperlipidemia)   . Hypercholesteremia   . Osteoporosis   . PAF (paroxysmal atrial fibrillation) ([place])    a. diagnosed 05/10/2017; b. CHADS2VASc => 3 (age x 2, female)     Assessment: Pharmacy consulted for heparin dosing and monitoring in 83 yo female with atrial fibrillation and possible left atrial thrombus noted on echo.  Goal of Therapy:  Heparin level 0.3-0.7 units/ml Monitor platelets by anticoagulation protocol: Yes   Plan:  12/13@1300 , HL <0.10, bolus of heparin 1900 units, increase heparin infusion rate from 700 units/hr to heparin 900 units/hr.Check anti-Xa level in 8 hours and daily while on heparin   Baseline labs ordered Give 2500 units bolus x 1 Start heparin infusion at 700 units/hr Check anti-Xa level in 8 hours and daily while on heparin Continue to monitor H&H and platelets  Donna Christen Titianna Loomis, PharmD, BCPS Clinical Pharmacist 05/11/2017 1:19 PM    12/13 0200 heparin level 0.42. Continue current regimen. Recheck in 8 hours to confirm.  Sim Boast, PharmD, BCPS  05/11/17 1:19 PM

## 2017-05-11 NOTE — Evaluation (Signed)
Physical Therapy Evaluation Patient Details Name: Christy Waters MRN: [id number] DOB: Aug 02, 1918 Today's Date: 05/11/2017   History of Present Illness  83 y/o female here with chest pain that has largely resolved.   Clinical Impression  Pt did very well with PT exam, daughter present and agrees that she is near her baseline.  She was able to easily negotiate steps and circumambulate the nurses' station with only minimal fatigue.  She had no LOBs, age appropriate strength and mobility and generally was independent and safe with all tasks.  No further PT needs.     Follow Up Recommendations No PT follow up    Equipment Recommendations       Recommendations for Other Services       Precautions / Restrictions Precautions Precautions: Fall Restrictions Weight Bearing Restrictions: No      Mobility  Bed Mobility Overal bed mobility: Independent                Transfers Overall transfer level: Independent Equipment used: Rolling walker (2 wheeled)                Ambulation/Gait Ambulation/Gait assistance: Supervision Ambulation Distance (Feet): 250 Feet Assistive device: Rolling walker (2 wheeled)       General Gait Details: Pt is able to ambulate safely and confidently with ambulation.  She showed very little fatigue and no LOBs or other safety issues.   Stairs Stairs: Yes Stairs assistance: Supervision Stair Management: One rail Left Number of Stairs: 4 General stair comments: Pt did well with stair negotiation, uses b/l UEs on L rail  Wheelchair Mobility    Modified Rankin (Stroke Patients Only)       Balance Overall balance assessment: Modified Independent                                           Pertinent Vitals/Pain Pain Assessment: No/denies pain    Home Living Family/patient expects to be discharged to:: Private residence Living Arrangements: Children Available Help at Discharge: Family;Personal care  attendant Type of Home: House Home Access: Stairs to enter Entrance Stairs-Rails: Left Entrance Stairs-Number of Steps: 3   Home Equipment: Walker - 4 wheels      Prior Function Level of Independence: Independent         Comments: Indep with ADLs, household and limited community mobility; sup/assist with household chores from son as needed.  Endorses two falls within previous six months.     Hand Dominance        Extremity/Trunk Assessment   Upper Extremity Assessment Upper Extremity Assessment: Overall WFL for tasks assessed    Lower Extremity Assessment Lower Extremity Assessment: Overall WFL for tasks assessed       Communication   Communication: No difficulties  Cognition Arousal/Alertness: Awake/alert Behavior During Therapy: WFL for tasks assessed/performed Overall Cognitive Status: Within Functional Limits for tasks assessed                                        General Comments      Exercises     Assessment/Plan    PT Assessment Patent does not need any further PT services  PT Problem List         PT Treatment Interventions      PT Goals (Current goals  can be found in the Care Plan section)  Acute Rehab PT Goals Patient Stated Goal: go home tonight PT Goal Formulation: All assessment and education complete, DC therapy    Frequency     Barriers to discharge        Co-evaluation               AM-PAC PT "6 Clicks" Daily Activity  Outcome Measure Difficulty turning over in bed (including adjusting bedclothes, sheets and blankets)?: None Difficulty moving from lying on back to sitting on the side of the bed? : None Difficulty sitting down on and standing up from a chair with arms (e.g., wheelchair, bedside commode, etc,.)?: None Help needed moving to and from a bed to chair (including a wheelchair)?: None Help needed walking in hospital room?: None Help needed climbing 3-5 steps with a railing? : None 6 Click  Score: 24    End of Session Equipment Utilized During Treatment: Gait belt Activity Tolerance: Patient tolerated treatment well Patient left: with chair alarm set;with call bell/phone within reach;with family/visitor present   PT Visit Diagnosis: Muscle weakness (generalized) (M62.81)    Time: 1657-9038 PT Time Calculation (min) (ACUTE ONLY): 19 min   Charges:   PT Evaluation $PT Eval Low Complexity: 1 Low     PT G Codes:   PT G-Codes **NOT FOR INPATIENT CLASS** Functional Assessment Tool Used: AM-PAC 6 Clicks Basic Mobility Functional Limitation: Mobility: Walking and moving around Mobility: Walking and Moving Around Current Status (B3383): 0 percent impaired, limited or restricted Mobility: Walking and Moving Around Goal Status (A9191): 0 percent impaired, limited or restricted Mobility: Walking and Moving Around Discharge Status (Y6060): 0 percent impaired, limited or restricted    Kreg Shropshire, DPT 05/11/2017, 3:21 PM

## 2017-05-11 NOTE — Progress Notes (Signed)
Pt given discharge instructions. Pt and family at bedside verbalized understanding with no further questions. IV's out and tele off. Pt transported via wheelchair.

## 2017-05-11 NOTE — Discharge Summary (Addendum)
[facility] NAME: Christy Waters    MR#:  [id number]  DATE OF BIRTH:  10-02-1918  DATE OF ADMISSION:  05/10/2017 ADMITTING PHYSICIAN: Saundra Shelling, MD  DATE OF DISCHARGE: 05/11/17  PRIMARY CARE PHYSICIAN: Rusty Aus, MD    ADMISSION DIAGNOSIS:  New onset atrial fibrillation ([place]) [I48.91] Chest pain, unspecified type [R07.9]  DISCHARGE DIAGNOSIS:  Paroxysmal A. Fib--new onset now on oral anticoagulation Suspected left apical thrombus--- now on oral anticoagulation Chest pain--normal Myoview stress test  SECONDARY DIAGNOSIS:   Past Medical History:  Diagnosis Date  . Anemia    distant past  . Arthritis    fingers, hips  . Cancer ([place])    skin cancer; some spots on left side of face; been 5 years since last treated;   . Headache   . History of echocardiogram    a. TTE 04/06/17: EF > 55%, no RWMA, no sig valvular disease  . HLD (hyperlipidemia)   . Hypercholesteremia   . Osteoporosis   . PAF (paroxysmal atrial fibrillation) ([place])    a. diagnosed 05/10/2017; b. CHADS2VASc => 3 (age x 2, female)    HOSPITAL COURSE:   83 year old female with past medical history significant for IBS with chronic abdominal pain, hyperlipidemia, depression and anxiety presents to hospital secondary to jaw pain/chest pain.  #1 new onset atrial fibrillation-could have caused her symptoms. -Currently converted to normal sinus rhythm. - Heart rate varying from 50s to 60s, so hold off on beta blockers. -po eliquis for anticoagulation  now---d/w dr Rockey Situ and family -echo showed possible left apical thrombus--now on po eliquis. ?TEE as out pt to confirm it...will defer to cardiology  2. Chest pain-t could be related to afib, none now -  troponins negative  -myoview negatvie with EF 85% per Dr Rockey Situ - appreciate cards consult - elevated LDL, consider statin   3. Depression and anxiety-continue Paxil and Wellbutrin  4.  GERD-on Pepcid  5. DVT prophylaxis-on Lovenox  Pt will d/c home today after being started on po eliquis in the evening.  CONSULTS OBTAINED:  Treatment Team:  Minna Merritts, MD  DRUG ALLERGIES:   Allergies  Allergen Reactions  . Azithromycin Shortness Of Breath and Other (See Comments)    Syncope Can not stand up, feels very sick, weak Syncope Syncope Can not stand up, feels very sick, weak Syncope Syncope Can not stand up, feels very sick, weak  . Clindamycin Other (See Comments)    GI problem  GI problem  GI problem   . Clindamycin/Lincomycin   . Hyoscyamine Diarrhea  . Lincomycin Other (See Comments)  . Maxitrol [Neomycin-Polymyxin-Dexameth]     Eye irritation  . Other Other (See Comments)    Eye irritation Eye irritation  . Timolol Other (See Comments)    Eye irritation Eye irritation Eye irritation  . Codeine Other (See Comments) and Nausea Only    Altered mental status FATIGUE, SICK FEELING Altered mental status Altered mental status FATIGUE, SICK FEELING  . Penicillins Rash    DISCHARGE MEDICATIONS:   Allergies as of 05/11/2017      Reactions   Azithromycin Shortness Of Breath, Other (See Comments)   Syncope Can not stand up, feels very sick, weak Syncope Syncope Can not stand up, feels very sick, weak Syncope Syncope Can not stand up, feels very sick, weak   Clindamycin Other (See Comments)   GI problem  GI problem  GI problem    Clindamycin/lincomycin  Hyoscyamine Diarrhea   Lincomycin Other (See Comments)   Maxitrol [neomycin-polymyxin-dexameth]    Eye irritation   Other Other (See Comments)   Eye irritation Eye irritation   Timolol Other (See Comments)   Eye irritation Eye irritation Eye irritation   Codeine Other (See Comments), Nausea Only   Altered mental status FATIGUE, SICK FEELING Altered mental status Altered mental status FATIGUE, SICK FEELING   Penicillins Rash      Medication List    TAKE these  medications   apixaban 2.5 MG Tabs tablet Commonly known as:  ELIQUIS Take 1 tablet (2.5 mg total) by mouth 2 (two) times daily.   carboxymethylcellulose 0.5 % Soln Commonly known as:  REFRESH PLUS Apply 1-2 drops to eye 2 (two) times daily as needed.   chlordiazePOXIDE 5 MG capsule Commonly known as:  LIBRIUM Take 5 mg by mouth 2 (two) times daily before a meal.   DULoxetine 20 MG capsule Commonly known as:  CYMBALTA Take 20 mg by mouth every morning.   feeding supplement (ENSURE ENLIVE) Liqd Take 237 mLs by mouth 2 (two) times daily between meals. Start taking on:  05/12/2017   gabapentin 100 MG capsule Commonly known as:  NEURONTIN Take 100 mg by mouth every morning.   KLOR-CON M20 20 MEQ tablet Generic drug:  potassium chloride SA Take 20 mEq by mouth daily.   LUMIGAN 0.01 % Soln Generic drug:  bimatoprost Place 1 drop into both eyes at bedtime.       If you experience worsening of your admission symptoms, develop shortness of breath, life threatening emergency, suicidal or homicidal thoughts you must seek medical attention immediately by calling 911 or calling your MD immediately  if symptoms less severe.  You Must read complete instructions/literature along with all the possible adverse reactions/side effects for all the Medicines you take and that have been prescribed to you. Take any new Medicines after you have completely understood and accept all the possible adverse reactions/side effects.   Please note  You were cared for by a hospitalist during your hospital stay. If you have any questions about your discharge medications or the care you received while you were in the hospital after you are discharged, you can call the unit and asked to speak with the hospitalist on call if the hospitalist that took care of you is not available. Once you are discharged, your primary care physician will handle any further medical issues. Please note that NO REFILLS for any  discharge medications will be authorized once you are discharged, as it is imperative that you return to your primary care physician (or establish a relationship with a primary care physician if you do not have one) for your aftercare needs so that they can reassess your need for medications and monitor your lab values. Today   SUBJECTIVE   Doing well Family in the room No chest pain.  VITAL SIGNS:  Blood pressure (!) 139/58, pulse 62, temperature 97.8 F (36.6 C), temperature source Oral, resp. rate 16, height 5\' 5"  (1.651 m), weight 52.6 kg (116 lb), SpO2 100 %.  I/O:    Intake/Output Summary (Last 24 hours) at 05/11/2017 1426 Last data filed at 05/11/2017 1319 Gross per 24 hour  Intake 1302.82 ml  Output 2250 ml  Net -947.18 ml    PHYSICAL EXAMINATION:  GENERAL:  83 y.o.-year-old patient lying in the bed with no acute distress.  EYES: Pupils equal, round, reactive to light and accommodation. No scleral icterus. Extraocular muscles intact.  HEENT: Head atraumatic, normocephalic. Oropharynx and nasopharynx clear.  NECK:  Supple, no jugular venous distention. No thyroid enlargement, no tenderness.  LUNGS: Normal breath sounds bilaterally, no wheezing, rales,rhonchi or crepitation. No use of accessory muscles of respiration.  CARDIOVASCULAR: S1, S2 normal. No murmurs, rubs, or gallops.  ABDOMEN: Soft, non-tender, non-distended. Bowel sounds present. No organomegaly or mass.  EXTREMITIES: No pedal edema, cyanosis, or clubbing.  NEUROLOGIC: Cranial nerves II through XII are intact. Muscle strength 5/5 in all extremities. Sensation intact. Gait not checked.  PSYCHIATRIC: The patient is alert and oriented x 3.  SKIN: No obvious rash, lesion, or ulcer.   DATA REVIEW:   CBC  Recent Labs  Lab 05/11/17 1107  WBC 3.4*  HGB 12.6  HCT 37.3  PLT 182    Chemistries  Recent Labs  Lab 05/10/17 0412 05/11/17 0207  NA 140 139  K 3.7 3.5  CL 108 108  CO2 26 25  GLUCOSE 100*  102*  BUN 21* 15  CREATININE 0.62 0.60  CALCIUM 8.4* 8.3*  MG 1.9  --     Microbiology Results   No results found for this or any previous visit (from the past 240 hour(s)).  RADIOLOGY:  Dg Chest Port 1 View  Result Date: 05/10/2017 CLINICAL DATA:  Chest pain radiating to the jaw. Lightheadedness tonight. EXAM: PORTABLE CHEST 1 VIEW COMPARISON:  08/28/2010 FINDINGS: Marked hyperinflation, unchanged. Marked cardiomegaly, unchanged. No airspace consolidation. Normal pulmonary vasculature. Unremarkable hilar and mediastinal contours. No pleural effusions. IMPRESSION: Stable cardiomegaly and hyperinflation. No consolidation. Normal vasculature. Electronically Signed   By: Andreas Newport M.D.   On: 05/10/2017 01:43     Management plans discussed with the patient, family and they are in agreement.  CODE STATUS:     Code Status Orders  (From admission, onward)        Start     Ordered   05/10/17 0355  Full code  Continuous     05/10/17 0354    Code Status History    Date Active Date Inactive Code Status Order ID Comments User Context   This patient has a current code status but no historical code status.    Advance Directive Documentation     Most Recent Value  Type of Advance Directive  Healthcare Power of Attorney, Out of facility DNR (pink MOST or yellow form)  Pre-existing out of facility DNR order (yellow form or pink MOST form)  No data  "MOST" Form in Place?  No data      TOTAL TIME TAKING CARE OF THIS PATIENT: *40* minutes.    Fritzi Mandes M.D on 05/11/2017 at 2:26 PM  Between 7am to 6pm - Pager - [phone] After 6pm go to [email or web address] - password [email or web address] [facility]  Office  [phone]  CC: Primary care physician; Rusty Aus, MD

## 2017-05-11 NOTE — Care Management (Signed)
Provided patient with 30 day trial coupon for eliquis.  She verbalizes that she has medication coverage through her medicare plan

## 2017-05-11 NOTE — Progress Notes (Signed)
Progress Note  Patient Name: Christy Waters Date of Encounter: 05/11/2017  Primary Cardiologist: new to [place] well, no further chest pain, Daughter present this morning, concern for Telemetry reviewed showing normal sinus rhythm  Inpatient Medications    Scheduled Meds: . aspirin EC  81 mg Oral Daily  . atorvastatin  40 mg Oral q1800  . buPROPion  75 mg Oral q morning - 10a  . chlordiazePOXIDE  5 mg Oral TID  . famotidine  20 mg Oral BID  . feeding supplement (ENSURE ENLIVE)  237 mL Oral BID BM  . latanoprost  1 drop Both Eyes QHS  . PARoxetine  15 mg Oral QPM   Continuous Infusions: . dextrose 5 % and 0.9% NaCl 50 mL/hr at 05/10/17 2316  . heparin 700 Units/hr (05/10/17 1751)   PRN Meds: acetaminophen, HYDROcodone-acetaminophen, nitroGLYCERIN, ondansetron (ZOFRAN) IV, polyvinyl alcohol   Vital Signs    Vitals:   05/10/17 0347 05/10/17 0801 05/10/17 1958 05/11/17 0432  BP: (!) 160/75 131/64 (!) 172/81 (!) 139/58  Pulse: 62 (!) 55 66 62  Resp: 18 16 18 16   Temp: (!) 97.5 F (36.4 C) 97.7 F (36.5 C) 97.8 F (36.6 C) 97.8 F (36.6 C)  TempSrc: Oral Oral Oral Oral  SpO2: 99% 93% 100% 100%  Weight:      Height:        Intake/Output Summary (Last 24 hours) at 05/11/2017 0842 Last data filed at 05/11/2017 0650 Gross per 24 hour  Intake 1422.82 ml  Output 1900 ml  Net -477.18 ml   Filed Weights   05/10/17 0049  Weight: 116 lb (52.6 kg)    Telemetry    Normal sinus rhythm- Personally Reviewed  ECG      Physical Exam   GEN: No acute distress.  Thin Neck: No JVD Cardiac: RRR, no murmurs, rubs, or gallops.  Respiratory: Clear to auscultation bilaterally. GI: Soft, nontender, non-distended  MS: No edema; No deformity. Neuro:  Nonfocal  Psych: Normal affect   Labs    Chemistry Recent Labs  Lab 05/10/17 0051 05/10/17 0412 05/11/17 0207  NA 140 140 139  K 4.0 3.7 3.5  CL 104 108 108  CO2 27 26 25   GLUCOSE  108* 100* 102*  BUN 25* 21* 15  CREATININE 0.75 0.62 0.60  CALCIUM 9.1 8.4* 8.3*  GFRNONAA >60 >60 >60  GFRAA >60 >60 >60  ANIONGAP 9 6 6      Hematology Recent Labs  Lab 05/10/17 0051 05/10/17 0412  WBC 4.4 3.5*  RBC 3.93 3.57*  HGB 12.9 11.8*  HCT 38.3 34.4*  MCV 97.5 96.3  MCH 32.7 32.9  MCHC 33.5 34.2  RDW 14.0 13.7  PLT 184 164    Cardiac Enzymes Recent Labs  Lab 05/10/17 0051 05/10/17 0355 05/10/17 1029 05/10/17 1541  TROPONINI <0.03 0.03* 0.03* 0.03*   No results for input(s): TROPIPOC in the last 168 hours.   BNPNo results for input(s): BNP, PROBNP in the last 168 hours.   DDimer No results for input(s): DDIMER in the last 168 hours.   Radiology    Dg Chest Port 1 View  Result Date: 05/10/2017 CLINICAL DATA:  Chest pain radiating to the jaw. Lightheadedness tonight. EXAM: PORTABLE CHEST 1 VIEW COMPARISON:  08/28/2010 FINDINGS: Marked hyperinflation, unchanged. Marked cardiomegaly, unchanged. No airspace consolidation. Normal pulmonary vasculature. Unremarkable hilar and mediastinal contours. No pleural effusions. IMPRESSION: Stable cardiomegaly and hyperinflation. No consolidation. Normal vasculature. Electronically Signed   By:  Andreas Newport M.D.   On: 05/10/2017 01:43    Cardiac Studies   Echocardiogram with normal ejection fraction Opacity in left atrium concerning for artifact, thrombus, mass, unclear  Patient Profile     83 year old woman with chronic GI disorder presenting with chest pain jaw pain noted to have atrial fibrillation on arrival maintaining normal sinus rhythm  Assessment & Plan    Paroxysmal atrial fibrillation Converted to normal sinus rhythm, maintained normal sinus rhythm overnight Long discussion with daughter in the room, patient Chads vasc 3 Given possible finding in left atrium and paroxysmal atrial fibrillation on arrival,  recommended they consider Eliquis 2.5 mg twice daily  Hyperlipidemia  LDL markedly high  162 Consider starting statin  Chronic abdominal pain Worse after eating, chronic nausea, IBS Followed by Dr. Sabra Heck primary care  Chest pain jaw pain Concerning for angina on arrival in the setting of atrial fibrillation Myoview scheduled this morning, test discussed in detail with the daughter   Total encounter time more than 35 minutes  Greater than 50% was spent in counseling and coordination of care with the patient   For questions or updates, please contact [facility] Please consult [email or web address] for contact info under Cardiology/STEMI.      Signed, Ida Rogue, MD  05/11/2017, 8:42 AM

## 2017-05-11 NOTE — Plan of Care (Signed)
  Adequate for Discharge Education: Knowledge of General Education information will improve 05/11/2017 1806 - Adequate for Discharge by Feliberto Gottron, RN Health Behavior/Discharge Planning: Ability to manage health-related needs will improve 05/11/2017 1806 - Adequate for Discharge by Feliberto Gottron, RN Clinical Measurements: Ability to maintain clinical measurements within normal limits will improve 05/11/2017 1806 - Adequate for Discharge by Chriss Czar, Illene Bolus, RN Will remain free from infection 05/11/2017 1806 - Adequate for Discharge by Feliberto Gottron, RN Diagnostic test results will improve 05/11/2017 1806 - Adequate for Discharge by Feliberto Gottron, RN Cardiovascular complication will be avoided 05/11/2017 1806 - Adequate for Discharge by Chriss Czar, Illene Bolus, RN Activity: Risk for activity intolerance will decrease 05/11/2017 1806 - Adequate for Discharge by Feliberto Gottron, RN Nutrition: Adequate nutrition will be maintained 05/11/2017 1806 - Adequate for Discharge by Chriss Czar, Illene Bolus, RN Elimination: Will not experience complications related to bowel motility 05/11/2017 1806 - Adequate for Discharge by Chriss Czar, Illene Bolus, RN Will not experience complications related to urinary retention 05/11/2017 1806 - Adequate for Discharge by Feliberto Gottron, RN Pain Managment: General experience of comfort will improve 05/11/2017 1806 - Adequate for Discharge by Feliberto Gottron, RN Safety: Ability to remain free from injury will improve 05/11/2017 1806 - Adequate for Discharge by Feliberto Gottron, RN Skin Integrity: Risk for impaired skin integrity will decrease 05/11/2017 1806 - Adequate for Discharge by Feliberto Gottron, RN Education: Knowledge of disease or condition will improve 05/11/2017 1806 - Adequate for Discharge by Feliberto Gottron, RN Understanding of  medication regimen will improve 05/11/2017 1806 - Adequate for Discharge by Feliberto Gottron, RN Activity: Ability to tolerate increased activity will improve 05/11/2017 1806 - Adequate for Discharge by Feliberto Gottron, RN Cardiac: Ability to achieve and maintain adequate cardiopulmonary perfusion will improve 05/11/2017 1806 - Adequate for Discharge by Feliberto Gottron, RN Health Behavior/Discharge Planning: Ability to safely manage health-related needs after discharge will improve 05/11/2017 1806 - Adequate for Discharge by Feliberto Gottron, RN

## 2017-05-12 LAB — NM MYOCAR MULTI W/SPECT W/WALL MOTION / EF
CHL CUP MPHR: 124 {beats}/min
CHL CUP NUCLEAR SDS: 0
CHL CUP NUCLEAR SRS: 1
CHL CUP NUCLEAR SSS: 0
CHL CUP RESTING HR STRESS: 56 {beats}/min
CSEPED: 0 min
CSEPEW: 1 METS
CSEPHR: 89 %
CSEPPHR: 111 {beats}/min
Exercise duration (sec): 0 s
LV dias vol: 36 mL (ref 46–106)
LV sys vol: 12 mL
TID: 0.87

## 2017-05-17 ENCOUNTER — Telehealth: Payer: Self-pay | Admitting: *Deleted

## 2017-05-17 NOTE — Telephone Encounter (Signed)
Patient contacted regarding discharge from [facility] on 05/11/17.  - S/w patient's daughter, Jan. Encouraged her to sign DPR at upcoming appointment and she verbalized understanding.  Patient understands to follow up with provider ? On 05/31/17 at 2 pm at [facility].  Patient understands discharge instructions? Yes  Patient understands medications and regiment? Yes   Patient understands to bring all medications to this visit? Yes

## 2017-05-17 NOTE — Telephone Encounter (Signed)
-----   Message from Blain Pais sent at 05/12/2017 11:17 AM EST ----- Regarding: tcm/ph 05/31/2017 2:00  Murray Hodgkins, NP

## 2017-05-29 ENCOUNTER — Encounter: Payer: Self-pay | Admitting: Physician Assistant

## 2017-05-29 NOTE — Progress Notes (Signed)
Cardiology Office Note Date:  05/31/2017  Patient ID:  Christy Waters [birth year], MRN [id number] PCP:  Rusty Aus, MD  Cardiologist:  Dr. Rockey Situ, MD    Chief Complaint: Hospital follow up  History of Present Illness: Christy Waters is a 83 y.o. female with history of recently diagnosed Afib on 05/10/2017 on ELiquis, possible left atrial mass noted on echo 05/10/2017, IBS with chronic abdominal pain, generalized fatigue, HLD, depression, and anxiety who presents for hosptial follow up after recent admission from 12/12-12/13 for new onset Afib  Prior to the above admission she did not have any previously known cardiac history. She had undergone echo on 04/06/2017, ordered by her PCP, for generalized fatigue that showed normal LVSF, moderate RV systolic dysfunction, severe TR, elevated PASP (of note, no left atrial mass was identified). She presented to [facility] on 12/12 with left-sided jaw pain that radiated into her left shoulder and left side of her chest. Troponin peaked at 0.03 x 3. EKG showed new onset Afib. Echo 05/10/2017 showed EF 65-70%, no RWMA, Gr1DD, calcified mitral annulus with mild MR, mild to moderately dilated left atrium with irregular, echodense material in the left atrium that was incompletely visualized, possibly representing artifact vs thrombus, or intracardiac mass with possible recommendation for TEE as indicated. Echo also showed normal RV systolic function with a mildly dilated RV, moderate TR, and moderately increased PASP. She underwent Lexiscan Myoview on 05/11/17 that showed no significant ischemia, low risk study. During her admission, she converted to sinus rhythm and maintained sinus rhythm at discharge. Given her elevated CHADS2VASc and possible left atrial thrombus, it was recommended she start Eliquis 2.5 mg bid (age and weight). She was not placed on rate controlling medications given a resting heart rate in the 50s to 60s bpm in sinus rhythm.    Since her discharge, she has done reasonably well.  She feels that her activity levels are back to baseline.  She has not had any palpitations or left-sided chest discomfort, which was her presenting symptom in December.  She notes a several month history of intermittent lightheadedness, that typically occurs while standing, though not necessarily immediately after standing.  Symptoms usually last a few minutes and resolve with sitting and rest.  There are no associated symptoms during those spells.  She has never worn a monitor and is not interested in wearing one at this time.  She denies PND, orthopnea, syncope, edema, or early satiety.  She does have some degree of chronic GI upset after eating however.   Past Medical History:  Diagnosis Date  . Anemia    distant past  . Arthritis    fingers, hips  . Cancer ([place])    skin cancer; some spots on left side of face; been 5 years since last treated;   . Headache   . History of echocardiogram    a. TTE 04/06/17: EF > 55%, no RWMA, no LA mass, severe TR, RV systolic dysfunction, elevated PASP; b. TTE 04/2017: EF 65-70%, no RWMA, Gr1DD, calcified mitral annulus w/ mild MR, mild to mod dilated LA w/ irregular, echodense material in the LA incompletely visualized, possibly representing artifact vs thrombus, or intracardiac mass, rec TEE, mod TR, mildly dilated RV, inc PASP  . History of nuclear stress test    a. 12/18: no sig ischemia, low risk  . HLD (hyperlipidemia)   . Left atrial mass    a. see echo from 04/2017 - ? mass vs LA thrombus in the  setting of PAF -->Eliquis.  . Osteoporosis   . PAF (paroxysmal atrial fibrillation) ([place])    a. diagnosed 05/10/2017; b. CHADS2VASc --> 3 (age x 2, female)--> Eliquis 2.5 (age/wt).    Past Surgical History:  Procedure Laterality Date  . CATARACT EXTRACTION W/PHACO Left 09/14/2015   Procedure: CATARACT EXTRACTION PHACO AND INTRAOCULAR LENS PLACEMENT (IOC);  Surgeon: Ronnell Freshwater, MD;   Location: [facility];  Service: Ophthalmology;  Laterality: Left;  MALYUGIN  . EYE SURGERY Right    cataract surgery schedule for Easter Monday 2017;     Current Meds  Medication Sig  . apixaban (ELIQUIS) 2.5 MG TABS tablet Take 1 tablet (2.5 mg total) by mouth 2 (two) times daily.  . carboxymethylcellulose (REFRESH PLUS) 0.5 % SOLN Apply 1-2 drops to eye 2 (two) times daily as needed.   . chlordiazePOXIDE (LIBRIUM) 5 MG capsule Take 5 mg by mouth 2 (two) times daily before a meal.   . DULoxetine (CYMBALTA) 20 MG capsule Take 20 mg by mouth every morning.  . feeding supplement, ENSURE ENLIVE, (ENSURE ENLIVE) LIQD Take 237 mLs by mouth 2 (two) times daily between meals.  . gabapentin (NEURONTIN) 100 MG capsule Take 100 mg by mouth every morning.  Marland Kitchen KLOR-CON M20 20 MEQ tablet Take 20 mEq by mouth daily.  Marland Kitchen LUMIGAN 0.01 % SOLN Place 1 drop into both eyes at bedtime.     Allergies:   Azithromycin; Clindamycin; Clindamycin/lincomycin; Hyoscyamine; Lincomycin; Maxitrol [neomycin-polymyxin-dexameth]; Other; Timolol; Codeine; and Penicillins   Social History:  The patient  reports that  has never smoked. she has never used smokeless tobacco. She reports that she does not drink alcohol or use drugs.   Family History:  The patient's family history includes Arrhythmia in her mother; CAD in her mother; Liver cancer in her father.  ROS:   Intermittent lightheadedness and presyncope that occurs while standing, lasting approximately 5 minutes and resolving with rest.  She denies chest pain, palpitations, dyspnea, PND, orthopnea, syncope, edema, or early satiety.  All other systems are reviewed and negative.  PHYSICAL EXAM:  VS:  BP (!) 108/58 (BP Location: Left Arm, Patient Position: Sitting, Cuff Size: Normal)   Pulse 71   Ht '5\' 4"'  (1.626 m)   Wt 117 lb 8 oz (53.3 kg)   BMI 20.17 kg/m  BMI: Body mass index is 20.17 kg/m.  Physical Exam  Constitutional: She is oriented to person, place,  and time. She appears well-developed and well-nourished.  HENT:  Head: Normocephalic.  Neck: Neck supple.  Cardiovascular: Normal rate, regular rhythm and normal heart sounds.  Pulmonary/Chest: Effort normal and breath sounds normal.  Abdominal: Soft. Bowel sounds are normal.  Neurological: She is alert and oriented to person, place, and time.  Skin: Skin is warm and dry.  Psychiatric: She has a normal mood and affect.   EKG:  Was ordered and interpreted by me today. Shows regular sinus rhythm, 71, PAC, left axis deviation, left anterior fascicular block, left atrial enlargement, inferior infarct, no acute changes.  Recent Labs: 05/10/2017: Magnesium 1.9; TSH 1.854 05/11/2017: BUN 15; Creatinine, Ser 0.60; Hemoglobin 12.6; Platelets 182; Potassium 3.5; Sodium 139  05/10/2017: Cholesterol 250; HDL 75; LDL Cholesterol 162; Total CHOL/HDL Ratio 3.3; Triglycerides 65; VLDL 13   Estimated Creatinine Clearance: 34.6 mL/min (by C-G formula based on SCr of 0.6 mg/dL).   Wt Readings from Last 3 Encounters:  05/31/17 117 lb 8 oz (53.3 kg)  05/10/17 116 lb (52.6 kg)  07/04/16 126 lb (  57.2 kg)     Other studies reviewed: Additional studies/records reviewed today include: summarized above  ASSESSMENT AND PLAN:  1. Paroxysmal atrial fibrillation: Patient recently admitted with chest pain and A. fib.  She subsequently converted to sinus rhythm.  Which was bradycardic prior to discharge and thus was not placed on AV nodal blocking agents.  She was placed on Eliquis 2.5 mg twice daily in the setting of a CHA2DS2VASc of 3 and possible left atrial appendage thrombus noted on chest wall echo.  Patient unaware of any recurrence of palpitations/chest pain/A. fib.  She is in sinus rhythm today with PACs noted on examination and EKG.  Heart rate is controlled.  She has been tolerating Eliquis and denies any melena or dark stools.  She will be due for follow-up CBC and be met in about 2 weeks, as it will have  been a month since initiating Eliquis.  She has lab work scheduled with her primary care provider on January 14. 2. Presyncope: Patient notes episodic lightheadedness and feeling as though she might faint, occurring multiple times per week, exclusively while standing.  Symptoms last about 5 minutes and resolve with rest.  She denies any palpitations or diaphoresis associated with the symptoms.  We checked orthostatics today and she was not orthostatic though, she did see "black spots" when moving from a lying to a seated position.  Blood pressure was stable.  On further questioning, it sounds like she does not hydrate very well at home.  I did recommend that she hydrate better and also considering being a little more liberal with her salt intake as her blood pressure tends to run on the low side of normal.  We also discussed the role of potential monitoring with to assess for arrhythmias that might be associated with presyncope.  She is not interested in this at this time. 3. Disposition: Patient will have labs checked through primary care in about 2 weeks.  She will follow-up with Dr. Rockey Situ in approximately 2-3 months.  Signed, Murray Hodgkins, NP 05/31/2017 3:37 PM     [address] [place], [place] [address] [phone]

## 2017-05-31 ENCOUNTER — Telehealth: Payer: Self-pay | Admitting: Physician Assistant

## 2017-05-31 ENCOUNTER — Ambulatory Visit: Payer: Medicare Other | Admitting: Nurse Practitioner

## 2017-05-31 ENCOUNTER — Encounter: Payer: Self-pay | Admitting: Nurse Practitioner

## 2017-05-31 VITALS — BP 108/58 | HR 71 | Ht 64.0 in | Wt 117.5 lb

## 2017-05-31 DIAGNOSIS — R55 Syncope and collapse: Secondary | ICD-10-CM | POA: Diagnosis not present

## 2017-05-31 DIAGNOSIS — I480 Paroxysmal atrial fibrillation: Secondary | ICD-10-CM

## 2017-05-31 NOTE — Patient Instructions (Signed)
Medication Instructions: - Your physician recommends that you continue on your current medications as directed. Please refer to the Current Medication list given to you today.  Labwork: - none ordered  Procedures/Testing: - none ordered  Follow-Up: - Your physician recommends that you schedule a follow-up appointment in: 2-3 months with Dr. Rockey Situ   Any Additional Special Instructions Will Be Listed Below (If Applicable). - you may increase your salt/ fluid intake    If you need a refill on your cardiac medications before your next appointment, please call your pharmacy.

## 2017-05-31 NOTE — Telephone Encounter (Signed)
l mom to change appt today to 2:00, due to provider out of office

## 2017-05-31 NOTE — Telephone Encounter (Signed)
Left voicemail message for son to call back regarding appointment time.

## 2017-06-06 ENCOUNTER — Ambulatory Visit: Payer: Medicare Other | Admitting: Podiatry

## 2017-06-09 ENCOUNTER — Encounter: Payer: Self-pay | Admitting: Podiatry

## 2017-06-09 ENCOUNTER — Ambulatory Visit: Payer: Medicare Other | Admitting: Podiatry

## 2017-06-09 DIAGNOSIS — B351 Tinea unguium: Secondary | ICD-10-CM | POA: Diagnosis not present

## 2017-06-09 DIAGNOSIS — M79609 Pain in unspecified limb: Secondary | ICD-10-CM

## 2017-06-12 NOTE — Progress Notes (Signed)
   SUBJECTIVE Patient presents to office today complaining of elongated, thickened nails. Pain while ambulating in shoes. Patient is unable to trim their own nails.   Past Medical History:  Diagnosis Date  . Anemia    distant past  . Arthritis    fingers, hips  . Cancer ([place])    skin cancer; some spots on left side of face; been 5 years since last treated;   . Headache   . History of echocardiogram    a. TTE 04/06/17: EF > 55%, no RWMA, no LA mass, severe TR, RV systolic dysfunction, elevated PASP; b. TTE 04/2017: EF 65-70%, no RWMA, Gr1DD, calcified mitral annulus w/ mild MR, mild to mod dilated LA w/ irregular, echodense material in the LA incompletely visualized, possibly representing artifact vs thrombus, or intracardiac mass, rec TEE, mod TR, mildly dilated RV, inc PASP  . History of nuclear stress test    a. 12/18: no sig ischemia, low risk  . HLD (hyperlipidemia)   . Left atrial mass    a. see echo from 04/2017 - ? mass vs LA thrombus in the setting of PAF -->Eliquis.  . Osteoporosis   . PAF (paroxysmal atrial fibrillation) ([place])    a. diagnosed 05/10/2017; b. CHADS2VASc --> 3 (age x 2, female)--> Eliquis 2.5 (age/wt).    OBJECTIVE General Patient is awake, alert, and oriented x 3 and in no acute distress. Derm Skin is dry and supple bilateral. Negative open lesions or macerations. Remaining integument unremarkable. Nails are tender, long, thickened and dystrophic with subungual debris, consistent with onychomycosis, 1-5 bilateral. No signs of infection noted. Vasc  DP and PT pedal pulses palpable bilaterally. Temperature gradient within normal limits.  Neuro Epicritic and protective threshold sensation diminished bilaterally.  Musculoskeletal Exam No symptomatic pedal deformities noted bilateral. Muscular strength within normal limits.  ASSESSMENT 1. Onychodystrophic nails 1-5 bilateral with hyperkeratosis of nails.  2. Onychomycosis of nail due to dermatophyte bilateral 3.  Pain in foot bilateral  PLAN OF CARE 1. Patient evaluated today.  2. Instructed to maintain good pedal hygiene and foot care.  3. Mechanical debridement of nails 1-5 bilaterally performed using a nail nipper. Filed with dremel without incident.  4. Return to clinic in 3 mos.    Edrick Kins, DPM [facility]  Dr. Edrick Kins, [address]                                        [place], [place] [address]                Office [phone]  Fax [phone]

## 2017-06-19 DIAGNOSIS — Z8679 Personal history of other diseases of the circulatory system: Secondary | ICD-10-CM | POA: Insufficient documentation

## 2017-06-19 DIAGNOSIS — R7989 Other specified abnormal findings of blood chemistry: Secondary | ICD-10-CM | POA: Insufficient documentation

## 2017-08-16 ENCOUNTER — Ambulatory Visit: Payer: Medicare Other | Admitting: Cardiovascular Disease

## 2017-08-22 ENCOUNTER — Other Ambulatory Visit: Payer: Self-pay | Admitting: Student

## 2017-08-22 DIAGNOSIS — G8929 Other chronic pain: Secondary | ICD-10-CM

## 2017-08-22 DIAGNOSIS — R109 Unspecified abdominal pain: Principal | ICD-10-CM

## 2017-08-31 ENCOUNTER — Ambulatory Visit
Admission: RE | Admit: 2017-08-31 | Discharge: 2017-08-31 | Disposition: A | Discharge status: Home / Self Care | Payer: Medicare Other | Source: Ambulatory Visit | Attending: Student | Admitting: Student

## 2017-08-31 DIAGNOSIS — R109 Unspecified abdominal pain: Secondary | ICD-10-CM | POA: Insufficient documentation

## 2017-08-31 DIAGNOSIS — N289 Disorder of kidney and ureter, unspecified: Secondary | ICD-10-CM | POA: Diagnosis not present

## 2017-08-31 DIAGNOSIS — G8929 Other chronic pain: Secondary | ICD-10-CM | POA: Insufficient documentation

## 2017-08-31 DIAGNOSIS — I700 Atherosclerosis of aorta: Secondary | ICD-10-CM | POA: Diagnosis not present

## 2017-08-31 MED ORDER — IOHEXOL 300 MG/ML  SOLN
85.0000 mL | Freq: Once | INTRAMUSCULAR | Status: AC | PRN
  Administered 2017-08-31: 85 mL via INTRAVENOUS

## 2017-09-08 ENCOUNTER — Ambulatory Visit: Payer: Medicare Other | Admitting: Podiatry

## 2017-09-08 ENCOUNTER — Encounter: Payer: Self-pay | Admitting: Podiatry

## 2017-09-08 DIAGNOSIS — B351 Tinea unguium: Secondary | ICD-10-CM

## 2017-09-08 DIAGNOSIS — M79609 Pain in unspecified limb: Secondary | ICD-10-CM

## 2017-09-11 NOTE — Progress Notes (Signed)
   SUBJECTIVE Patient presents to office today complaining of elongated, thickened nails that cause pain while ambulating in shoes. She is unable to trim her own nails. Patient is here for further evaluation and treatment.  Past Medical History:  Diagnosis Date  . Anemia    distant past  . Arthritis    fingers, hips  . Cancer ([facility])    skin cancer; some spots on left side of face; been 5 years since last treated;   . Headache   . History of echocardiogram    a. TTE 04/06/17: EF > 55%, no RWMA, no LA mass, severe TR, RV systolic dysfunction, elevated PASP; b. TTE 04/2017: EF 65-70%, no RWMA, Gr1DD, calcified mitral annulus w/ mild MR, mild to mod dilated LA w/ irregular, echodense material in the LA incompletely visualized, possibly representing artifact vs thrombus, or intracardiac mass, rec TEE, mod TR, mildly dilated RV, inc PASP  . History of nuclear stress test    a. 12/18: no sig ischemia, low risk  . HLD (hyperlipidemia)   . Left atrial mass    a. see echo from 04/2017 - ? mass vs LA thrombus in the setting of PAF -->Eliquis.  . Osteoporosis   . PAF (paroxysmal atrial fibrillation) ([facility])    a. diagnosed 05/10/2017; b. CHADS2VASc --> 3 (age x 2, female)--> Eliquis 2.5 (age/wt).    OBJECTIVE General Patient is awake, alert, and oriented x 3 and in no acute distress. Derm Skin is dry and supple bilateral. Negative open lesions or macerations. Remaining integument unremarkable. Nails are tender, long, thickened and dystrophic with subungual debris, consistent with onychomycosis, 1-5 bilateral. No signs of infection noted. Vasc  DP and PT pedal pulses palpable bilaterally. Temperature gradient within normal limits.  Neuro Epicritic and protective threshold sensation grossly intact bilaterally.  Musculoskeletal Exam No symptomatic pedal deformities noted bilateral. Muscular strength within normal limits.  ASSESSMENT 1. Onychodystrophic nails 1-5 bilateral with hyperkeratosis of nails.    2. Onychomycosis of nail due to dermatophyte bilateral 3. Pain in foot bilateral  PLAN OF CARE 1. Patient evaluated today.  2. Instructed to maintain good pedal hygiene and foot care.  3. Mechanical debridement of nails 1-5 bilaterally performed using a nail nipper. Filed with dremel without incident.  4. Return to clinic in 3 mos.    Brent M. Evans, DPM [facility]  Dr. Brent M. Evans, DPM    [address]                                        [place], [place] [address]                Office [phone]  Fax [phone]     

## 2017-09-19 DIAGNOSIS — I2781 Cor pulmonale (chronic): Secondary | ICD-10-CM | POA: Insufficient documentation

## 2017-10-18 ENCOUNTER — Emergency Department
Admission: EM | Admit: 2017-10-18 | Discharge: 2017-10-18 | Disposition: A | Discharge status: Home / Self Care | Payer: Medicare Other | Attending: Emergency Medicine | Admitting: Emergency Medicine

## 2017-10-18 ENCOUNTER — Other Ambulatory Visit: Payer: Self-pay

## 2017-10-18 ENCOUNTER — Encounter: Payer: Self-pay | Admitting: Emergency Medicine

## 2017-10-18 DIAGNOSIS — E860 Dehydration: Secondary | ICD-10-CM | POA: Diagnosis not present

## 2017-10-18 DIAGNOSIS — R531 Weakness: Secondary | ICD-10-CM | POA: Diagnosis not present

## 2017-10-18 DIAGNOSIS — Z79899 Other long term (current) drug therapy: Secondary | ICD-10-CM | POA: Insufficient documentation

## 2017-10-18 DIAGNOSIS — Z85828 Personal history of other malignant neoplasm of skin: Secondary | ICD-10-CM | POA: Insufficient documentation

## 2017-10-18 DIAGNOSIS — Z7901 Long term (current) use of anticoagulants: Secondary | ICD-10-CM | POA: Insufficient documentation

## 2017-10-18 DIAGNOSIS — D72829 Elevated white blood cell count, unspecified: Secondary | ICD-10-CM | POA: Insufficient documentation

## 2017-10-18 LAB — URINALYSIS, COMPLETE (UACMP) WITH MICROSCOPIC
Bacteria, UA: NONE SEEN
Bilirubin Urine: NEGATIVE
Glucose, UA: NEGATIVE mg/dL
Hgb urine dipstick: NEGATIVE
KETONES UR: NEGATIVE mg/dL
Nitrite: NEGATIVE
PH: 7 (ref 5.0–8.0)
Protein, ur: NEGATIVE mg/dL
SPECIFIC GRAVITY, URINE: 1.011 (ref 1.005–1.030)

## 2017-10-18 LAB — BASIC METABOLIC PANEL
Anion gap: 5 (ref 5–15)
BUN: 14 mg/dL (ref 6–20)
CHLORIDE: 103 mmol/L (ref 101–111)
CO2: 29 mmol/L (ref 22–32)
CREATININE: 0.57 mg/dL (ref 0.44–1.00)
Calcium: 8.9 mg/dL (ref 8.9–10.3)
GFR calc non Af Amer: >60 mL/min (ref >60)
Glucose, Bld: 108 mg/dL — ABNORMAL HIGH (ref 65–99)
Potassium: 3.9 mmol/L (ref 3.5–5.1)
Sodium: 137 mmol/L (ref 135–145)

## 2017-10-18 LAB — CBC
HCT: 38.1 % (ref 35.0–47.0)
Hemoglobin: 13 g/dL (ref 12.0–16.0)
MCH: 32.2 pg (ref 26.0–34.0)
MCHC: 34.1 g/dL (ref 32.0–36.0)
MCV: 94.3 fL (ref 80.0–100.0)
PLATELETS: 209 10*3/uL (ref 150–440)
RBC: 4.04 MIL/uL (ref 3.80–5.20)
RDW: 14.3 % (ref 11.5–14.5)
WBC: 3.3 10*3/uL — ABNORMAL LOW (ref 3.6–11.0)

## 2017-10-18 LAB — TROPONIN I: Troponin I: 0.04 ng/mL (ref <0.03)

## 2017-10-18 MED ORDER — SODIUM CHLORIDE 0.9 % IV BOLUS
500.0000 mL | Freq: Once | INTRAVENOUS | Status: AC
  Administered 2017-10-18: 500 mL via INTRAVENOUS

## 2017-10-18 NOTE — ED Notes (Signed)
#  20 angio removed from R [facility]

## 2017-10-18 NOTE — ED Triage Notes (Signed)
Pt to ED via EMS from home c/o generalized weakness that started after waking up, has not eaten today, denies pain or SOB.  Sensation intact bilaterally, speech intact, strong extremities x4.

## 2017-10-18 NOTE — Discharge Instructions (Addendum)
Please seek medical attention for any high fevers, chest pain, shortness of breath, change in behavior, persistent vomiting, bloody stool or any other new or concerning symptoms.  

## 2017-10-18 NOTE — ED Notes (Signed)
Up to BR then to ambulate in hall

## 2017-10-18 NOTE — ED Notes (Signed)
Date and time results received: 10/18/17 1535 (use smartphrase ".now" to insert current time)  Test: troponin Critical Value: 0.04  Name of Provider Notified: charge RN stephen

## 2017-10-18 NOTE — ED Provider Notes (Signed)
[facility] Emergency Department Provider Note   ____________________________________________   I have reviewed the triage vital signs and the nursing notes.   HISTORY  Chief Complaint Weakness   History limited by: Not Limited   HPI Christy Waters Christy Waters is a 84 y.o. female who presents to the emergency department today because of concerns for weakness.  The patient states that she does get weak frequently.  Typically it is worse in the mornings.  She states this morning it was particularly bad.  She felt like she would not be able to get up out of bed.  She denies any associated chest pain or palpitations.  She denies any associated headaches.  She has had urinary tract infections in the past and patient's family was wondering if that could be the cause. The patient however denies any dysuria, denies any change in odor to her urine.   Per medical record review patient has a history of UTI in the past.   Past Medical History:  Diagnosis Date  . Anemia    distant past  . Arthritis    fingers, hips  . Cancer ([place])    skin cancer; some spots on left side of face; been 5 years since last treated;   . Headache   . History of echocardiogram    a. TTE 04/06/17: EF > 55%, no RWMA, no LA mass, severe TR, RV systolic dysfunction, elevated PASP; b. TTE 04/2017: EF 65-70%, no RWMA, Gr1DD, calcified mitral annulus w/ mild MR, mild to mod dilated LA w/ irregular, echodense material in the LA incompletely visualized, possibly representing artifact vs thrombus, or intracardiac mass, rec TEE, mod TR, mildly dilated RV, inc PASP  . History of nuclear stress test    a. 12/18: no sig ischemia, low risk  . HLD (hyperlipidemia)   . Left atrial mass    a. see echo from 04/2017 - ? mass vs LA thrombus in the setting of PAF -->Eliquis.  . Osteoporosis   . PAF (paroxysmal atrial fibrillation) ([place])    a. diagnosed 05/10/2017; b. CHADS2VASc --> 3 (age x 2, female)--> Eliquis 2.5  (age/wt).    Patient Active Problem List   Diagnosis Date Noted  . Chest pain 05/10/2017  . 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 Irritable bowel syndrome with diarrhea 05/20/2014  . Abdominal pain, generalized 04/01/2014  . Atherosclerosis of aorta ([place]) 03/05/2014  . 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 Idiopathic peripheral neuropathy 03/05/2014  . Abdominal pain of unknown cause 02/06/2014  . MI (mitral incompetence) 02/06/2014  . Stress incontinence 02/06/2014  . Fibrosis of lung ([place]) 10/03/2013  . Generalized OA 10/03/2013  . HLD (hyperlipidemia) 10/03/2013  . BP (high blood pressure) 10/03/2013  . OP (osteoporosis) 10/03/2013  . Beat, premature ventricular 10/03/2013    Past Surgical History:  Procedure Laterality Date  . CATARACT EXTRACTION W/PHACO Left 09/14/2015   Procedure: CATARACT EXTRACTION PHACO AND INTRAOCULAR LENS PLACEMENT (IOC);  Surgeon: Ronnell Freshwater, MD;  Location: [facility];  Service: Ophthalmology;  Laterality: Left;  [place] Right    cataract surgery schedule for Easter Monday 2017;     Prior to Admission medications   Medication Sig Start Date End Date Taking? Authorizing Provider  apixaban (ELIQUIS) 2.5 MG TABS tablet Take 1 tablet (2.5 mg total) by mouth 2 (two) times daily. 05/11/17   Fritzi Mandes, MD  carboxymethylcellulose (REFRESH PLUS) 0.5 % SOLN Apply 1-2 drops to eye 2 (two) times daily as needed.  [provider]  chlordiazePOXIDE (LIBRIUM) 5 MG capsule Take 5 mg by mouth 2 (two) times daily before a meal.     [provider]  chlordiazePOXIDE (LIBRIUM) 5 MG capsule Take by mouth. 07/13/17   [provider]  DULoxetine (CYMBALTA) 20 MG capsule Take 20 mg by mouth every morning.    [provider]  feeding supplement, ENSURE ENLIVE, (ENSURE ENLIVE) LIQD Take 237 mLs by mouth 2 (two) times daily between meals. 05/12/17   Fritzi Mandes, MD  gabapentin (NEURONTIN) 100 MG  capsule Take 100 mg by mouth every morning.    [provider]  gabapentin (NEURONTIN) 100 MG capsule Take 100 mg by mouth. 04/04/17   [provider]  KLOR-CON M20 20 MEQ tablet Take 20 mEq by mouth daily. 05/23/16   [provider]  LUMIGAN 0.01 % SOLN Place 1 drop into both eyes at bedtime.     [provider]  pantoprazole (PROTONIX) 40 MG tablet  08/08/17   [provider]  potassium chloride (K-DUR) 10 MEQ tablet  09/07/17   [provider]  potassium chloride SA (K-DUR,KLOR-CON) 20 MEQ tablet Take by mouth. 05/23/16   [provider]  sucralfate (CARAFATE) 1 g tablet  08/08/17   [provider]    Allergies Azithromycin; Clindamycin; Clindamycin/lincomycin; Hyoscyamine; Lincomycin; Maxitrol [neomycin-polymyxin-dexameth]; Other; Timolol; Codeine; and Penicillins  Family History  Problem Relation Age of Onset  . CAD Mother   . Arrhythmia Mother   . Liver cancer Father     Social History Social History   Tobacco Use  . Smoking status: Never Smoker  . Smokeless tobacco: Never Used  Substance Use Topics  . Alcohol use: No  . Drug use: No    Review of Systems Constitutional: No fever. Positive for weakness. Eyes: No visual changes. ENT: No sore throat. Cardiovascular: Denies chest pain. Respiratory: Denies shortness of breath. Gastrointestinal: No abdominal pain.  No nausea, no vomiting.  No diarrhea.   Genitourinary: Negative for dysuria. Musculoskeletal: Negative for back pain. Skin: Negative for rash. Neurological: Negative for headaches, focal weakness or numbness.  ____________________________________________   PHYSICAL EXAM:  VITAL SIGNS: ED Triage Vitals  Enc Vitals Group     BP 10/18/17 1403 136/79     Pulse Rate 10/18/17 1403 68     Resp 10/18/17 1403 14     Temp 10/18/17 1403 98 F (36.7 C)     Temp Source 10/18/17 1403 Oral     SpO2 10/18/17 1403 96 %     Weight 10/18/17 1405 126  lb (57.2 kg)     Height 10/18/17 1405 5\' 5"  (1.651 m)     Head Circumference --      Peak Flow --      Pain Score 10/18/17 1405 0   Constitutional: Alert and oriented. Well appearing and in no distress. Eyes: Conjunctivae are normal.  ENT   Head: Normocephalic and atraumatic.   Nose: No congestion/rhinnorhea.   Mouth/Throat: Mucous membranes are moist.   Neck: No stridor. Hematological/Lymphatic/Immunilogical: No cervical lymphadenopathy. Cardiovascular: Normal rate, regular rhythm.  No murmurs, rubs, or gallops.  Respiratory: Normal respiratory effort without tachypnea nor retractions. Breath sounds are clear and equal bilaterally. No wheezes/rales/rhonchi. Gastrointestinal: Soft and non tender. No rebound. No guarding.  Genitourinary: Deferred Musculoskeletal: Normal range of motion in all extremities. No lower extremity edema. Neurologic:  Normal speech and language. No gross focal neurologic deficits are appreciated.  Skin:  Skin is warm, dry and intact. No  rash noted. Psychiatric: Mood and affect are normal. Speech and behavior are normal. Patient exhibits appropriate insight and judgment.  ____________________________________________    LABS (pertinent positives/negatives)  Trop 0.04 CBC wbc 3.3, hgb 13.0, plt 209 BMP wnl except glu 108 UA leukocytes moderate  ____________________________________________   EKG  I, Nance Pear, attending physician, personally viewed and interpreted this EKG  EKG Time: 1400 Rate: 70 Rhythm: sinus rhythm with PAC Axis: left axis deviation Intervals: qtc 434 QRS: LAFB ST changes: no st elevation Impression: abnormal ekg   ____________________________________________    RADIOLOGY  None  ____________________________________________   PROCEDURES  Procedures  ____________________________________________   INITIAL IMPRESSION / ASSESSMENT AND PLAN / ED COURSE  Pertinent labs & imaging results that were  available during my care of the patient were reviewed by me and considered in my medical decision making (see chart for details).  Patient presented to the emergency department today because of concerns for weakness.  The patient states this is happened with some irregularity.  Urine showed moderate leukocytes but no other signs of urinary tract infection.  Patient not having any other clinical signs of urinary tract infection.  At this point will send for urine culture.  Patient was mildly orthostatic.  Patient was given IV fluids and did feel some relief.  This point I do wonder if dehydration could be main cause of patient's weakness.  Discussed the findings with the patient and family.   ____________________________________________   FINAL CLINICAL IMPRESSION(S) / ED DIAGNOSES  Final diagnoses:  Weakness  Dehydration     Note: This dictation was prepared with Dragon dictation. Any transcriptional errors that result from this process are unintentional     Nance Pear, MD 10/18/17 1901

## 2017-10-18 NOTE — ED Triage Notes (Signed)
FIRST NURSE NOTE-generalized weakness for few weeks.  Near syncope.  VSS with EMS.  bg 86 with EMS

## 2017-10-19 LAB — URINE CULTURE

## 2017-12-19 ENCOUNTER — Ambulatory Visit: Payer: Medicare Other | Admitting: Podiatry

## 2017-12-22 ENCOUNTER — Ambulatory Visit: Payer: Medicare Other | Admitting: Podiatry

## 2017-12-22 ENCOUNTER — Encounter: Payer: Self-pay | Admitting: Podiatry

## 2017-12-22 DIAGNOSIS — M79609 Pain in unspecified limb: Secondary | ICD-10-CM

## 2017-12-22 DIAGNOSIS — B351 Tinea unguium: Secondary | ICD-10-CM | POA: Diagnosis not present

## 2017-12-23 NOTE — Progress Notes (Signed)
   SUBJECTIVE Patient presents to office today complaining of elongated, thickened nails that cause pain while ambulating in shoes. She is unable to trim her own nails. Patient is here for further evaluation and treatment.  Past Medical History:  Diagnosis Date  . Anemia    distant past  . Arthritis    fingers, hips  . Cancer ([place])    skin cancer; some spots on left side of face; been 5 years since last treated;   . Headache   . History of echocardiogram    a. TTE 04/06/17: EF > 55%, no RWMA, no LA mass, severe TR, RV systolic dysfunction, elevated PASP; b. TTE 04/2017: EF 65-70%, no RWMA, Gr1DD, calcified mitral annulus w/ mild MR, mild to mod dilated LA w/ irregular, echodense material in the LA incompletely visualized, possibly representing artifact vs thrombus, or intracardiac mass, rec TEE, mod TR, mildly dilated RV, inc PASP  . History of nuclear stress test    a. 12/18: no sig ischemia, low risk  . HLD (hyperlipidemia)   . Left atrial mass    a. see echo from 04/2017 - ? mass vs LA thrombus in the setting of PAF -->Eliquis.  . Osteoporosis   . PAF (paroxysmal atrial fibrillation) ([place])    a. diagnosed 05/10/2017; b. CHADS2VASc --> 3 (age x 2, female)--> Eliquis 2.5 (age/wt).    OBJECTIVE General Patient is awake, alert, and oriented x 3 and in no acute distress. Derm Skin is dry and supple bilateral. Negative open lesions or macerations. Remaining integument unremarkable. Nails are tender, long, thickened and dystrophic with subungual debris, consistent with onychomycosis, 1-5 bilateral. No signs of infection noted. Vasc  DP and PT pedal pulses palpable bilaterally. Temperature gradient within normal limits.  Neuro Epicritic and protective threshold sensation grossly intact bilaterally.  Musculoskeletal Exam No symptomatic pedal deformities noted bilateral. Muscular strength within normal limits.  ASSESSMENT 1. Onychodystrophic nails 1-5 bilateral with hyperkeratosis of nails.    2. Onychomycosis of nail due to dermatophyte bilateral 3. Pain in foot bilateral  PLAN OF CARE 1. Patient evaluated today.  2. Instructed to maintain good pedal hygiene and foot care.  3. Mechanical debridement of nails 1-5 bilaterally performed using a nail nipper. Filed with dremel without incident.  4. Return to clinic in 3 mos.    Edrick Kins, DPM [facility]  Dr. Edrick Kins, [address] [place]                                        [place], [place] [address]                Office [phone]  Fax [phone]

## 2018-03-20 ENCOUNTER — Encounter: Payer: Self-pay | Admitting: Podiatry

## 2018-03-20 ENCOUNTER — Ambulatory Visit: Payer: Medicare Other | Admitting: Podiatry

## 2018-03-20 DIAGNOSIS — B351 Tinea unguium: Secondary | ICD-10-CM | POA: Diagnosis not present

## 2018-03-20 DIAGNOSIS — M79609 Pain in unspecified limb: Secondary | ICD-10-CM

## 2018-03-20 DIAGNOSIS — R102 Pelvic and perineal pain: Secondary | ICD-10-CM | POA: Insufficient documentation

## 2018-03-25 NOTE — Progress Notes (Signed)
   SUBJECTIVE Patient presents to office today complaining of elongated, thickened nails that cause pain while ambulating in shoes. She is unable to trim her own nails. Patient is here for further evaluation and treatment.  Past Medical History:  Diagnosis Date  . Anemia    distant past  . Arthritis    fingers, hips  . Cancer ([place])    skin cancer; some spots on left side of face; been 5 years since last treated;   . Headache   . History of echocardiogram    a. TTE 04/06/17: EF > 55%, no RWMA, no LA mass, severe TR, RV systolic dysfunction, elevated PASP; b. TTE 04/2017: EF 65-70%, no RWMA, Gr1DD, calcified mitral annulus w/ mild MR, mild to mod dilated LA w/ irregular, echodense material in the LA incompletely visualized, possibly representing artifact vs thrombus, or intracardiac mass, rec TEE, mod TR, mildly dilated RV, inc PASP  . History of nuclear stress test    a. 12/18: no sig ischemia, low risk  . HLD (hyperlipidemia)   . Left atrial mass    a. see echo from 04/2017 - ? mass vs LA thrombus in the setting of PAF -->Eliquis.  . Osteoporosis   . PAF (paroxysmal atrial fibrillation) ([place])    a. diagnosed 05/10/2017; b. CHADS2VASc --> 3 (age x 2, female)--> Eliquis 2.5 (age/wt).    OBJECTIVE General Patient is awake, alert, and oriented x 3 and in no acute distress. Derm Skin is dry and supple bilateral. Negative open lesions or macerations. Remaining integument unremarkable. Nails are tender, long, thickened and dystrophic with subungual debris, consistent with onychomycosis, 1-5 bilateral. No signs of infection noted. Vasc  DP and PT pedal pulses palpable bilaterally. Temperature gradient within normal limits.  Neuro Epicritic and protective threshold sensation grossly intact bilaterally.  Musculoskeletal Exam No symptomatic pedal deformities noted bilateral. Muscular strength within normal limits.  ASSESSMENT 1. Onychodystrophic nails 1-5 bilateral with hyperkeratosis of nails.    2. Onychomycosis of nail due to dermatophyte bilateral 3. Pain in foot bilateral  PLAN OF CARE 1. Patient evaluated today.  2. Instructed to maintain good pedal hygiene and foot care.  3. Mechanical debridement of nails 1-5 bilaterally performed using a nail nipper. Filed with dremel without incident.  4. Return to clinic in 3 mos.    Edrick Kins, DPM [facility]  Dr. Edrick Kins, [address] [place]                                        [place], [place] [address]                Office [phone]  Fax [phone]

## 2018-06-19 ENCOUNTER — Ambulatory Visit: Payer: Medicare Other | Admitting: Podiatry

## 2018-06-21 ENCOUNTER — Encounter: Payer: Self-pay | Admitting: Podiatry

## 2018-06-21 ENCOUNTER — Ambulatory Visit: Payer: Medicare Other | Admitting: Podiatry

## 2018-06-21 DIAGNOSIS — M79676 Pain in unspecified toe(s): Secondary | ICD-10-CM | POA: Diagnosis not present

## 2018-06-21 DIAGNOSIS — M79609 Pain in unspecified limb: Principal | ICD-10-CM

## 2018-06-21 DIAGNOSIS — B351 Tinea unguium: Secondary | ICD-10-CM | POA: Diagnosis not present

## 2018-06-21 DIAGNOSIS — D689 Coagulation defect, unspecified: Secondary | ICD-10-CM | POA: Diagnosis not present

## 2018-06-21 DIAGNOSIS — G629 Polyneuropathy, unspecified: Secondary | ICD-10-CM

## 2018-06-21 NOTE — Progress Notes (Signed)
Complaint:  Visit Type: Patient returns to my office for continued preventative foot care services. Complaint: Patient states" my nails have grown long and thick and become painful to walk and wear shoes" Patient has been diagnosed with peripheral neuropathy. The patient presents for preventative foot care services. No changes to ROS.  Patient is taking eliquiss.  Podiatric Exam: Vascular: dorsalis pedis and posterior tibial pulses are palpable bilateral. Capillary return is immediate. Temperature gradient is WNL. Skin turgor WNL  Sensorium: Normal Semmes Weinstein monofilament test. Normal tactile sensation bilaterally. Nail Exam: Pt has thick disfigured discolored nails with subungual debris noted bilateral entire nail hallux through fifth toenails Ulcer Exam: There is no evidence of ulcer or pre-ulcerative changes or infection. Orthopedic Exam: Muscle tone and strength are WNL. No limitations in general ROM. No crepitus or effusions noted. Foot type and digits show no abnormalities. Bony prominences are unremarkable. Skin: No Porokeratosis. No infection or ulcers  Diagnosis:  Onychomycosis, , Pain in right toe, pain in left toes  Treatment & Plan Procedures and Treatment: Consent by patient was obtained for treatment procedures. The patient understood the discussion of treatment and procedures well. All questions were answered thoroughly reviewed. Debridement of mycotic and hypertrophic toenails, 1 through 5 bilateral and clearing of subungual debris. No ulceration, no infection noted.  Return Visit-Office Procedure: Patient instructed to return to the office for a follow up visit 3 months for continued evaluation and treatment.    Pennie Vanblarcom DPM 

## 2018-09-20 ENCOUNTER — Ambulatory Visit: Payer: Medicare Other | Admitting: Podiatry

## 2018-10-18 ENCOUNTER — Encounter: Payer: Self-pay | Admitting: Podiatry

## 2018-10-18 ENCOUNTER — Ambulatory Visit: Payer: Medicare Other | Admitting: Podiatry

## 2018-10-18 ENCOUNTER — Other Ambulatory Visit: Payer: Self-pay

## 2018-10-18 VITALS — Temp 97.8°F

## 2018-10-18 DIAGNOSIS — D689 Coagulation defect, unspecified: Secondary | ICD-10-CM

## 2018-10-18 DIAGNOSIS — M79676 Pain in unspecified toe(s): Secondary | ICD-10-CM

## 2018-10-18 DIAGNOSIS — B351 Tinea unguium: Secondary | ICD-10-CM | POA: Diagnosis not present

## 2018-10-18 NOTE — Progress Notes (Signed)
Complaint:  Visit Type: Patient returns to my office for continued preventative foot care services. Complaint: Patient states" my nails have grown long and thick and become painful to walk and wear shoes" Patient has been diagnosed with peripheral neuropathy. The patient presents for preventative foot care services. No changes to ROS.  Patient is taking eliquiss.  Podiatric Exam: Vascular: dorsalis pedis and posterior tibial pulses are palpable bilateral. Capillary return is immediate. Temperature gradient is WNL. Skin turgor WNL  Sensorium: Normal Semmes Weinstein monofilament test. Normal tactile sensation bilaterally. Nail Exam: Pt has thick disfigured discolored nails with subungual debris noted bilateral entire nail hallux through fifth toenails Ulcer Exam: There is no evidence of ulcer or pre-ulcerative changes or infection. Orthopedic Exam: Muscle tone and strength are WNL. No limitations in general ROM. No crepitus or effusions noted. Foot type and digits show no abnormalities. Bony prominences are unremarkable. Skin: No Porokeratosis. No infection or ulcers  Diagnosis:  Onychomycosis, , Pain in right toe, pain in left toes  Treatment & Plan Procedures and Treatment: Consent by patient was obtained for treatment procedures. The patient understood the discussion of treatment and procedures well. All questions were answered thoroughly reviewed. Debridement of mycotic and hypertrophic toenails, 1 through 5 bilateral and clearing of subungual debris. No ulceration, no infection noted.  Return Visit-Office Procedure: Patient instructed to return to the office for a follow up visit 3 months for continued evaluation and treatment.    Gardiner Barefoot DPM

## 2019-01-17 ENCOUNTER — Encounter: Payer: Self-pay | Admitting: Podiatry

## 2019-01-17 ENCOUNTER — Ambulatory Visit (INDEPENDENT_AMBULATORY_CARE_PROVIDER_SITE_OTHER): Payer: Medicare Other | Admitting: Podiatry

## 2019-01-17 ENCOUNTER — Other Ambulatory Visit: Payer: Self-pay

## 2019-01-17 VITALS — Temp 97.2°F

## 2019-01-17 DIAGNOSIS — D689 Coagulation defect, unspecified: Secondary | ICD-10-CM | POA: Diagnosis not present

## 2019-01-17 DIAGNOSIS — B351 Tinea unguium: Secondary | ICD-10-CM

## 2019-01-17 DIAGNOSIS — G629 Polyneuropathy, unspecified: Secondary | ICD-10-CM | POA: Diagnosis not present

## 2019-01-17 DIAGNOSIS — M79676 Pain in unspecified toe(s): Secondary | ICD-10-CM | POA: Diagnosis not present

## 2019-01-17 NOTE — Progress Notes (Signed)
Complaint:  Visit Type: Patient returns to my office for continued preventative foot care services. Complaint: Patient states" my nails have grown long and thick and become painful to walk and wear shoes" Patient has been diagnosed with peripheral neuropathy. The patient presents for preventative foot care services. No changes to ROS.  Patient is taking eliquiss.  Podiatric Exam: Vascular: dorsalis pedis and posterior tibial pulses are palpable bilateral. Capillary return is immediate. Temperature gradient is WNL. Skin turgor WNL  Sensorium: Normal Semmes Weinstein monofilament test. Normal tactile sensation bilaterally. Nail Exam: Pt has thick disfigured discolored nails with subungual debris noted bilateral entire nail hallux through fifth toenails Ulcer Exam: There is no evidence of ulcer or pre-ulcerative changes or infection. Orthopedic Exam: Muscle tone and strength are WNL. No limitations in general ROM. No crepitus or effusions noted. Foot type and digits show no abnormalities. Bony prominences are unremarkable. Skin: No Porokeratosis. No infection or ulcers  Diagnosis:  Onychomycosis, , Pain in right toe, pain in left toes  Treatment & Plan Procedures and Treatment: Consent by patient was obtained for treatment procedures. The patient understood the discussion of treatment and procedures well. All questions were answered thoroughly reviewed. Debridement of mycotic and hypertrophic toenails, 1 through 5 bilateral and clearing of subungual debris. No ulceration, no infection noted.  Return Visit-Office Procedure: Patient instructed to return to the office for a follow up visit 3 months for continued evaluation and treatment.    Gardiner Barefoot DPM

## 2019-04-18 ENCOUNTER — Ambulatory Visit: Payer: Medicare Other | Admitting: Podiatry

## 2019-04-18 ENCOUNTER — Other Ambulatory Visit: Payer: Self-pay

## 2019-04-18 ENCOUNTER — Encounter: Payer: Self-pay | Admitting: Podiatry

## 2019-04-18 DIAGNOSIS — G629 Polyneuropathy, unspecified: Secondary | ICD-10-CM | POA: Diagnosis not present

## 2019-04-18 DIAGNOSIS — B351 Tinea unguium: Secondary | ICD-10-CM

## 2019-04-18 DIAGNOSIS — D689 Coagulation defect, unspecified: Secondary | ICD-10-CM

## 2019-04-18 DIAGNOSIS — M79609 Pain in unspecified limb: Secondary | ICD-10-CM | POA: Diagnosis not present

## 2019-04-18 NOTE — Progress Notes (Signed)
Complaint:  Visit Type: Patient returns to my office for continued preventative foot care services. Complaint: Patient states" my nails have grown long and thick and become painful to walk and wear shoes" Patient has been diagnosed with peripheral neuropathy. The patient presents for preventative foot care services. No changes to ROS.  Patient is taking eliquiss.  Podiatric Exam: Vascular: dorsalis pedis and posterior tibial pulses are palpable bilateral. Capillary return is immediate. Temperature gradient is WNL. Skin turgor WNL  Sensorium: Normal Semmes Weinstein monofilament test. Normal tactile sensation bilaterally. Nail Exam: Pt has thick disfigured discolored nails with subungual debris noted bilateral entire nail hallux through fifth toenails Ulcer Exam: There is no evidence of ulcer or pre-ulcerative changes or infection. Orthopedic Exam: Muscle tone and strength are WNL. No limitations in general ROM. No crepitus or effusions noted. Foot type and digits show no abnormalities. Bony prominences are unremarkable. Skin: No Porokeratosis. No infection or ulcers  Diagnosis:  Onychomycosis, , Pain in right toe, pain in left toes  Treatment & Plan Procedures and Treatment: Consent by patient was obtained for treatment procedures. The patient understood the discussion of treatment and procedures well. All questions were answered thoroughly reviewed. Debridement of mycotic and hypertrophic toenails, 1 through 5 bilateral and clearing of subungual debris. No ulceration, no infection noted.  Return Visit-Office Procedure: Patient instructed to return to the office for a follow up visit 3 months for continued evaluation and treatment.    Linzie Criss DPM 

## 2019-05-23 ENCOUNTER — Emergency Department
Admission: EM | Admit: 2019-05-23 | Discharge: 2019-05-23 | Disposition: A | Discharge status: Home / Self Care | Payer: Medicare Other | Attending: Emergency Medicine | Admitting: Emergency Medicine

## 2019-05-23 ENCOUNTER — Emergency Department: Payer: Medicare Other

## 2019-05-23 ENCOUNTER — Other Ambulatory Visit: Payer: Self-pay

## 2019-05-23 ENCOUNTER — Encounter: Payer: Self-pay | Admitting: Emergency Medicine

## 2019-05-23 DIAGNOSIS — Y999 Unspecified external cause status: Secondary | ICD-10-CM | POA: Diagnosis not present

## 2019-05-23 DIAGNOSIS — S2242XA Multiple fractures of ribs, left side, initial encounter for closed fracture: Secondary | ICD-10-CM | POA: Insufficient documentation

## 2019-05-23 DIAGNOSIS — Z9181 History of falling: Secondary | ICD-10-CM | POA: Diagnosis not present

## 2019-05-23 DIAGNOSIS — Z85828 Personal history of other malignant neoplasm of skin: Secondary | ICD-10-CM | POA: Diagnosis not present

## 2019-05-23 DIAGNOSIS — Y939 Activity, unspecified: Secondary | ICD-10-CM | POA: Insufficient documentation

## 2019-05-23 DIAGNOSIS — Y929 Unspecified place or not applicable: Secondary | ICD-10-CM | POA: Diagnosis not present

## 2019-05-23 DIAGNOSIS — W19XXXA Unspecified fall, initial encounter: Secondary | ICD-10-CM | POA: Diagnosis not present

## 2019-05-23 DIAGNOSIS — S299XXA Unspecified injury of thorax, initial encounter: Secondary | ICD-10-CM | POA: Diagnosis present

## 2019-05-23 DIAGNOSIS — Z79899 Other long term (current) drug therapy: Secondary | ICD-10-CM | POA: Insufficient documentation

## 2019-05-23 MED ORDER — TRAMADOL HCL 50 MG PO TABS: 50.0000 mg | ORAL_TABLET | Freq: Four times a day (QID) | ORAL | 0 refills | Status: AC | PRN

## 2019-05-23 MED ORDER — TRAMADOL HCL 50 MG PO TABS: 50.0000 mg | ORAL_TABLET | Freq: Four times a day (QID) | ORAL | 0 refills | Status: DC | PRN

## 2019-05-23 MED ORDER — TRAMADOL HCL 50 MG PO TABS
50.0000 mg | ORAL_TABLET | Freq: Once | ORAL | Status: AC
  Administered 2019-05-23: 50 mg via ORAL
  Filled 2019-05-23: qty 1

## 2019-05-23 NOTE — Discharge Instructions (Addendum)
Take the tramadol as needed for pain.  You should switch to over-the-counter Tylenol as soon as you are able to.  Make sure to take deep breaths frequently.  Return to the ER immediately for new, worsening, or persistent severe pain, shortness of breath, weakness, recurrent falls or lightheadedness, or any other new or worsening symptoms that concern you.

## 2019-05-23 NOTE — ED Notes (Addendum)
Spoke to pt's daughter Jan. Daughter requesting Rx be sent to [facility] on [address] d/t [facility] isn't open during Christmas holiday. EDP notified. Pt to return home via [facility] because daughter doesn't think she will be able to help pt into the house without further injury for pt.

## 2019-05-23 NOTE — ED Notes (Signed)
E signature pad not working. Pt educated on discharge instructions and verbalized understanding. Discharge paperwork given to EMS for transport home.

## 2019-05-23 NOTE — ED Triage Notes (Addendum)
Pt presents to ED via [facility] from home c/o posterior L rib cage pain after falling into a side table today. Pt states she has had increasing trouble with legs giving out over the last few months and has been seeing PCP for it - arrives wearing Holter monitor.

## 2019-05-23 NOTE — ED Notes (Addendum)
Pt's daughter Mary Sella [phone]) states she lived in [place] and can come pick pt up if discharged. Daughter states sometimes calls from hospital are sent straight to voicemail so leave a message and she will return call.

## 2019-05-23 NOTE — ED Provider Notes (Signed)
[facility] Emergency Department Provider Note ____________________________________________   First MD Initiated Contact with Patient 05/23/19 1655     (approximate)  I have reviewed the triage vital signs and the nursing notes.   HISTORY  Chief Complaint Fall    HPI Christy Waters is a 85 y.o. female with PMH as noted below who presents with left rib pain after a fall, acute onset earlier today, and occurring when she felt like her legs gave out on her.  The patient states that this has happened several times recently and she is currently being worked up for it by her doctor.  She reports that she will feel like her legs are wobbly and weak, and that she falls.  She did not feel dizzy or lightheaded, and did not hit her head or lose consciousness.  She has no back or hip pain.  The only pain she reports is to the left rib area.  Past Medical History:  Diagnosis Date  . Anemia    distant past  . Arthritis    fingers, hips  . Cancer ([place])    skin cancer; some spots on left side of face; been 5 years since last treated;   . Headache   . History of echocardiogram    a. TTE 04/06/17: EF > 55%, no RWMA, no LA mass, severe TR, RV systolic dysfunction, elevated PASP; b. TTE 04/2017: EF 65-70%, no RWMA, Gr1DD, calcified mitral annulus w/ mild MR, mild to mod dilated LA w/ irregular, echodense material in the LA incompletely visualized, possibly representing artifact vs thrombus, or intracardiac mass, rec TEE, mod TR, mildly dilated RV, inc PASP  . History of nuclear stress test    a. 12/18: no sig ischemia, low risk  . HLD (hyperlipidemia)   . Left atrial mass    a. see echo from 04/2017 - ? mass vs LA thrombus in the setting of PAF -->Eliquis.  . Osteoporosis   . PAF (paroxysmal atrial fibrillation) ([place])    a. diagnosed 05/10/2017; b. CHADS2VASc --> 3 (age x 2, female)--> Eliquis 2.5 (age/wt).    Patient Active Problem List   Diagnosis Date Noted   . Pelvic pain in female 03/20/2018  . Cor pulmonale ([place]) 09/19/2017  . Low serum vitamin D 06/19/2017  . History of atrial fibrillation 06/19/2017  . Chest pain 05/10/2017  . Moderate episode of recurrent major depressive disorder ([place]) 04/04/2017  . Adult idiopathic generalized osteoporosis 04/13/2016  . 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 Irritable bowel syndrome with diarrhea 05/20/2014  . Abdominal pain, generalized 04/01/2014  . Atherosclerosis of aorta ([place]) 03/05/2014  . 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 Idiopathic peripheral neuropathy 03/05/2014  . Abdominal pain of unknown cause 02/06/2014  . MI (mitral incompetence) 02/06/2014  . Stress incontinence 02/06/2014  . Fibrosis of lung ([place]) 10/03/2013  . Generalized OA 10/03/2013  . HLD (hyperlipidemia) 10/03/2013  . BP (high blood pressure) 10/03/2013  . OP (osteoporosis) 10/03/2013  . Beat, premature ventricular 10/03/2013  . Ventricular premature depolarization 10/03/2013    Past Surgical History:  Procedure Laterality Date  . CATARACT EXTRACTION W/PHACO Left 09/14/2015   Procedure: CATARACT EXTRACTION PHACO AND INTRAOCULAR LENS PLACEMENT (IOC);  Surgeon: Ronnell Freshwater, MD;  Location: [facility];  Service: Ophthalmology;  Laterality: Left;  [place] Right    cataract surgery schedule for Easter Monday 2017;     Prior to Admission medications   Medication Sig Start Date End Date Taking?  Authorizing Provider  ALPRAZolam Duanne Moron) 0.25 MG tablet  12/11/17   [provider]  apixaban (ELIQUIS) 2.5 MG TABS tablet Take 1 tablet (2.5 mg total) by mouth 2 (two) times daily. 05/11/17   Fritzi Mandes, MD  carboxymethylcellulose (REFRESH PLUS) 0.5 % SOLN Apply 1-2 drops to eye 2 (two) times daily as needed.     [provider]  chlordiazePOXIDE (LIBRIUM) 5 MG capsule TAKE 1 CAPSULE BY MOUTH THREE TIMES A DAY 11/11/15   [provider]  citalopram (CELEXA) 10 MG tablet  Take by mouth. 01/14/14   [provider]  DULoxetine (CYMBALTA) 20 MG capsule Take 20 mg by mouth every morning.    [provider]  feeding supplement, ENSURE ENLIVE, (ENSURE ENLIVE) LIQD Take 237 mLs by mouth 2 (two) times daily between meals. 05/12/17   Fritzi Mandes, MD  gabapentin (NEURONTIN) 100 MG capsule Take by mouth. 04/04/17   [provider]  KLOR-CON M20 20 MEQ tablet Take 10 mEq by mouth daily.  05/23/16   [provider]  LUMIGAN 0.01 % SOLN Place 1 drop into both eyes at bedtime.     [provider]  metoprolol succinate (TOPROL-XL) 25 MG 24 hr tablet 6.25 mg. 12/14/13   [provider]  PARoxetine (PAXIL) 20 MG tablet  12/11/17   [provider]  potassium chloride (K-DUR) 10 MEQ tablet  10/26/17   [provider]  traMADol (ULTRAM) 50 MG tablet Take 1 tablet (50 mg total) by mouth every 6 (six) hours as needed for up to 5 days for moderate pain. 05/23/19 05/28/19  Arta Silence, MD  UNABLE TO FIND Take by mouth.    [provider]    Allergies Azithromycin, Clindamycin, Clindamycin/lincomycin, Hyoscyamine, Lincomycin, Maxitrol [neomycin-polymyxin-dexameth], Other, Timolol, Codeine, and Penicillins  Family History  Problem Relation Age of Onset  . CAD Mother   . Arrhythmia Mother   . Liver cancer Father     Social History Social History   Tobacco Use  . Smoking status: Never Smoker  . Smokeless tobacco: Never Used  Substance Use Topics  . Alcohol use: No  . Drug use: No    Review of Systems  Constitutional: No fever. Eyes: No redness. ENT: No neck pain. Cardiovascular: Denies chest pain. Respiratory: Denies shortness of breath. Gastrointestinal: No vomiting. Genitourinary: Negative for dysuria.  Musculoskeletal: Negative for back pain.  Positive for left rib pain. Skin: Negative for rash. Neurological: Negative for headaches, focal weakness or  numbness.   ____________________________________________   PHYSICAL EXAM:  VITAL SIGNS: ED Triage Vitals  Enc Vitals Group     BP 05/23/19 1647 (!) 166/85     Pulse Rate 05/23/19 1647 70     Resp 05/23/19 1647 20     Temp 05/23/19 1647 99.1 F (37.3 C)     Temp Source 05/23/19 1647 Oral     SpO2 05/23/19 1647 99 %     Weight 05/23/19 1648 125 lb (56.7 kg)     Height 05/23/19 1648 5\' 7"  (1.702 m)     Head Circumference --      Peak Flow --      Pain Score 05/23/19 1647 7     Pain Loc --      Pain Edu? --      Excl. in [place]? --     Constitutional: Alert and oriented. Well appearing for age and in no acute distress. Eyes: Conjunctivae are normal.  EOMI. Head: Atraumatic. Nose: No congestion/rhinnorhea. Mouth/Throat: Mucous  membranes are moist.   Neck: Normal range of motion.  Cardiovascular: Normal rate, regular rhythm.  Good peripheral circulation. Respiratory: Normal respiratory effort.  No retractions. Lungs CTAB. Gastrointestinal: Soft and nontender. No distention.  Genitourinary: No flank tenderness. Musculoskeletal: No lower extremity edema.  Extremities warm and well perfused.  No midline spinal tenderness.  Left lateral inferior rib area tenderness with some crepitus. Neurologic:  Normal speech and language.  Motor intact in all extremities.   Skin:  Skin is warm and dry. No rash noted. Psychiatric: Mood and affect are normal. Speech and behavior are normal.  ____________________________________________   LABS (all labs ordered are listed, but only abnormal results are displayed)  Labs Reviewed - No data to display ____________________________________________  EKG  ED ECG REPORT I, Arta Silence, the attending physician, personally viewed and interpreted this ECG.  Date: 05/23/2019 EKG Time: 1710 Rate: 67 Rhythm: normal sinus rhythm QRS Axis: normal Intervals: LAFB ST/T Wave abnormalities: normal Narrative Interpretation: no evidence of acute  ischemia  ____________________________________________  RADIOLOGY  XR L ribs: Acute nondisplaced left eighth and ninth rib fractures  ____________________________________________   PROCEDURES  Procedure(s) performed: No  Procedures  Critical Care performed: No ____________________________________________   INITIAL IMPRESSION / ASSESSMENT AND PLAN / ED COURSE  Pertinent labs & imaging results that were available during my care of the patient were reviewed by me and considered in my medical decision making (see chart for details).  85 year old female with PMH as noted above presents with left rib pain after a fall in which she felt like her legs gave out on her.  The patient has had recurrent falls like this recently.  I reviewed the past medical records in [place].  She is currently being evaluated by her PMD Dr. Sabra Heck for this and was last seen 3 days ago.  She now has a Holter monitor.  She denies any weakness, lightheadedness, near syncope, or any loss of consciousness.  On exam, she is well appearing for age and her vital signs are normal except for hypertension.  Neurologic exam is nonfocal.  She has no evidence of head or neck trauma, and no midline spinal tenderness.  She does have tenderness to the left lower rib area with some crepitus.  Overall presentation is consistent with a rib fracture.  The etiology of the patient's falls is somewhat unclear but appears at least subacute.  The patient had no prodromal symptoms and has been feeling okay, so I do not suspect dehydration, infection, or other systemic etiology.  EKG was obtained and shows no acute abnormalities.  We will obtain x-rays of the left ribs.  There is no indication for further lab work-up at this time.  ----------------------------------------- 9:26 PM on 05/23/2019 -----------------------------------------  Chest x-ray shows left eighth and ninth rib fractures.  The patient is oxygenating well and  appears comfortable.  She received tramadol and tolerated it without any significant side effects.  At this time, she is stable for discharge home.  The patient agrees with this plan.  I also discussed it with her daughter over the phone.  I gave him thorough return precautions and they expressed understanding.  I will prescribe tramadol for pain control. ____________________________________________   FINAL CLINICAL IMPRESSION(S) / ED DIAGNOSES  Final diagnoses:  Closed fracture of multiple ribs of left side, initial encounter      NEW MEDICATIONS STARTED DURING THIS VISIT:  Discharge Medication List as of 05/23/2019  6:52 PM    START taking these medications  Details  traMADol (ULTRAM) 50 MG tablet Take 1 tablet (50 mg total) by mouth every 6 (six) hours as needed for up to 5 days for moderate pain., Starting Thu 05/23/2019, Until Tue 05/28/2019, Normal         Note:  This document was prepared using Dragon voice recognition software and may include unintentional dictation errors.    Arta Silence, MD 05/23/19 2127

## 2019-06-03 ENCOUNTER — Other Ambulatory Visit: Payer: Self-pay | Admitting: Neurology

## 2019-06-03 DIAGNOSIS — R29898 Other symptoms and signs involving the musculoskeletal system: Secondary | ICD-10-CM

## 2019-06-07 DIAGNOSIS — I471 Supraventricular tachycardia, unspecified: Secondary | ICD-10-CM | POA: Insufficient documentation

## 2019-06-11 ENCOUNTER — Ambulatory Visit: Payer: Medicare Other

## 2019-06-19 DIAGNOSIS — I495 Sick sinus syndrome: Secondary | ICD-10-CM | POA: Insufficient documentation

## 2019-07-15 ENCOUNTER — Other Ambulatory Visit: Payer: Self-pay

## 2019-07-15 ENCOUNTER — Emergency Department: Payer: Medicare Other

## 2019-07-15 ENCOUNTER — Emergency Department
Admission: EM | Admit: 2019-07-15 | Discharge: 2019-07-15 | Disposition: A | Discharge status: Home / Self Care | Payer: Medicare Other | Attending: Emergency Medicine | Admitting: Emergency Medicine

## 2019-07-15 ENCOUNTER — Encounter: Payer: Self-pay | Admitting: Emergency Medicine

## 2019-07-15 DIAGNOSIS — I10 Essential (primary) hypertension: Secondary | ICD-10-CM | POA: Insufficient documentation

## 2019-07-15 DIAGNOSIS — I700 Atherosclerosis of aorta: Secondary | ICD-10-CM | POA: Insufficient documentation

## 2019-07-15 DIAGNOSIS — Z79899 Other long term (current) drug therapy: Secondary | ICD-10-CM | POA: Diagnosis not present

## 2019-07-15 DIAGNOSIS — Z7901 Long term (current) use of anticoagulants: Secondary | ICD-10-CM | POA: Diagnosis not present

## 2019-07-15 DIAGNOSIS — I480 Paroxysmal atrial fibrillation: Secondary | ICD-10-CM

## 2019-07-15 DIAGNOSIS — I252 Old myocardial infarction: Secondary | ICD-10-CM | POA: Diagnosis not present

## 2019-07-15 DIAGNOSIS — R1030 Lower abdominal pain, unspecified: Secondary | ICD-10-CM

## 2019-07-15 LAB — COMPREHENSIVE METABOLIC PANEL
ALT: 10 U/L (ref 0–44)
AST: 21 U/L (ref 15–41)
Albumin: 3.8 g/dL (ref 3.5–5.0)
Alkaline Phosphatase: 106 U/L (ref 38–126)
Anion gap: 8 (ref 5–15)
BUN: 13 mg/dL (ref 8–23)
CO2: 26 mmol/L (ref 22–32)
Calcium: 8.8 mg/dL — ABNORMAL LOW (ref 8.9–10.3)
Chloride: 105 mmol/L (ref 98–111)
Creatinine, Ser: 0.72 mg/dL (ref 0.44–1.00)
GFR calc Af Amer: >60 mL/min (ref >60)
GFR calc non Af Amer: >60 mL/min (ref >60)
Glucose, Bld: 107 mg/dL — ABNORMAL HIGH (ref 70–99)
Potassium: 3.5 mmol/L (ref 3.5–5.1)
Sodium: 139 mmol/L (ref 135–145)
Total Bilirubin: 0.7 mg/dL (ref 0.3–1.2)
Total Protein: 7.2 g/dL (ref 6.5–8.1)

## 2019-07-15 LAB — CBC WITH DIFFERENTIAL/PLATELET
Abs Immature Granulocytes: 0.01 10*3/uL (ref 0.00–0.07)
Basophils Absolute: 0 10*3/uL (ref 0.0–0.1)
Basophils Relative: 1 %
Eosinophils Absolute: 0 10*3/uL (ref 0.0–0.5)
Eosinophils Relative: 1 %
HCT: 40.4 % (ref 36.0–46.0)
Hemoglobin: 13.1 g/dL (ref 12.0–15.0)
Immature Granulocytes: 0 %
Lymphocytes Relative: 19 %
Lymphs Abs: 0.8 10*3/uL (ref 0.7–4.0)
MCH: 31 pg (ref 26.0–34.0)
MCHC: 32.4 g/dL (ref 30.0–36.0)
MCV: 95.7 fL (ref 80.0–100.0)
Monocytes Absolute: 0.2 10*3/uL (ref 0.1–1.0)
Monocytes Relative: 5 %
Neutro Abs: 3.3 10*3/uL (ref 1.7–7.7)
Neutrophils Relative %: 74 %
Platelets: 215 10*3/uL (ref 150–400)
RBC: 4.22 MIL/uL (ref 3.87–5.11)
RDW: 14.6 % (ref 11.5–15.5)
WBC: 4.4 10*3/uL (ref 4.0–10.5)
nRBC: 0 % (ref 0.0–0.2)

## 2019-07-15 LAB — URINALYSIS, COMPLETE (UACMP) WITH MICROSCOPIC
Bacteria, UA: NONE SEEN
Bilirubin Urine: NEGATIVE
Glucose, UA: NEGATIVE mg/dL
Hgb urine dipstick: NEGATIVE
Ketones, ur: NEGATIVE mg/dL
Nitrite: NEGATIVE
Protein, ur: NEGATIVE mg/dL
Specific Gravity, Urine: 1.014 (ref 1.005–1.030)
pH: 6 (ref 5.0–8.0)

## 2019-07-15 LAB — LIPASE, BLOOD: Lipase: 32 U/L (ref 11–51)

## 2019-07-15 LAB — LACTIC ACID, PLASMA: Lactic Acid, Venous: 1.4 mmol/L (ref 0.5–1.9)

## 2019-07-15 MED ORDER — SODIUM CHLORIDE 0.9 % IV BOLUS
500.0000 mL | Freq: Once | INTRAVENOUS | Status: AC
  Administered 2019-07-15: 13:00:00 500 mL via INTRAVENOUS

## 2019-07-15 MED ORDER — DICYCLOMINE HCL 10 MG PO CAPS
10.0000 mg | ORAL_CAPSULE | Freq: Once | ORAL | Status: AC
  Administered 2019-07-15: 10 mg via ORAL
  Filled 2019-07-15: qty 1

## 2019-07-15 MED ORDER — IOHEXOL 350 MG/ML SOLN
75.0000 mL | Freq: Once | INTRAVENOUS | Status: AC | PRN
  Administered 2019-07-15: 75 mL via INTRAVENOUS

## 2019-07-15 MED ORDER — ACETAMINOPHEN 325 MG PO TABS: 650.0000 mg | ORAL_TABLET | Freq: Four times a day (QID) | ORAL | 0 refills | Status: DC | PRN

## 2019-07-15 MED ORDER — DICYCLOMINE HCL 10 MG PO CAPS: 10.0000 mg | ORAL_CAPSULE | Freq: Four times a day (QID) | ORAL | 0 refills | Status: DC | PRN

## 2019-07-15 NOTE — ED Provider Notes (Signed)
[facility] Emergency Department Provider Note  ____________________________________________  Time seen: Approximately 10:20 AM  I have reviewed the triage vital signs and the nursing notes.   HISTORY  Chief Complaint Abdominal Pain    HPI Christy Waters is a 86 y.o. female with a past history of paroxysmal atrial fibrillation, chronic abdominal pain who complains of worsening of abdominal pain since last night, gradual onset, constant, waxing and waning.  Nonradiating.  No aggravating or alleviating factors.  Diffuse lower abdominal.  Reports normal oral intake recently, no other symptoms, no fevers chills chest pain shortness of breath or body aches.      Past Medical History:  Diagnosis Date  . Anemia    distant past  . Arthritis    fingers, hips  . Cancer ([place])    skin cancer; some spots on left side of face; been 5 years since last treated;   . Headache   . History of echocardiogram    a. TTE 04/06/17: EF > 55%, no RWMA, no LA mass, severe TR, RV systolic dysfunction, elevated PASP; b. TTE 04/2017: EF 65-70%, no RWMA, Gr1DD, calcified mitral annulus w/ mild MR, mild to mod dilated LA w/ irregular, echodense material in the LA incompletely visualized, possibly representing artifact vs thrombus, or intracardiac mass, rec TEE, mod TR, mildly dilated RV, inc PASP  . History of nuclear stress test    a. 12/18: no sig ischemia, low risk  . HLD (hyperlipidemia)   . Left atrial mass    a. see echo from 04/2017 - ? mass vs LA thrombus in the setting of PAF -->Eliquis.  . Osteoporosis   . PAF (paroxysmal atrial fibrillation) ([place])    a. diagnosed 05/10/2017; b. CHADS2VASc --> 3 (age x 2, female)--> Eliquis 2.5 (age/wt).     Patient Active Problem List   Diagnosis Date Noted  . Pelvic pain in female 03/20/2018  . Cor pulmonale ([place]) 09/19/2017  . Low serum vitamin D 06/19/2017  . History of atrial fibrillation 06/19/2017  . Chest pain  05/10/2017  . Moderate episode of recurrent major depressive disorder ([place]) 04/04/2017  . Adult idiopathic generalized osteoporosis 04/13/2016  . 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 Irritable bowel syndrome with diarrhea 05/20/2014  . Abdominal pain, generalized 04/01/2014  . Atherosclerosis of aorta ([place]) 03/05/2014  . 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 Idiopathic peripheral neuropathy 03/05/2014  . Abdominal pain of unknown cause 02/06/2014  . MI (mitral incompetence) 02/06/2014  . Stress incontinence 02/06/2014  . Fibrosis of lung ([place]) 10/03/2013  . Generalized OA 10/03/2013  . HLD (hyperlipidemia) 10/03/2013  . BP (high blood pressure) 10/03/2013  . OP (osteoporosis) 10/03/2013  . Beat, premature ventricular 10/03/2013  . Ventricular premature depolarization 10/03/2013     Past Surgical History:  Procedure Laterality Date  . CATARACT EXTRACTION W/PHACO Left 09/14/2015   Procedure: CATARACT EXTRACTION PHACO AND INTRAOCULAR LENS PLACEMENT (IOC);  Surgeon: Ronnell Freshwater, MD;  Location: [place];  Service: Ophthalmology;  Laterality: Left;  [place] Right    cataract surgery schedule for Easter Monday 2017;      Prior to Admission medications   Medication Sig Start Date End Date Taking? Authorizing Provider  acetaminophen (TYLENOL) 325 MG tablet Take 2 tablets (650 mg total) by mouth every 6 (six) hours as needed. 07/15/19   Carrie Mew, MD  ALPRAZolam Duanne Moron) 0.25 MG tablet  12/11/17   [provider]  apixaban (ELIQUIS) 2.5 MG TABS tablet Take 1  tablet (2.5 mg total) by mouth 2 (two) times daily. 05/11/17   Fritzi Mandes, MD  carboxymethylcellulose (REFRESH PLUS) 0.5 % SOLN Apply 1-2 drops to eye 2 (two) times daily as needed.     [provider]  chlordiazePOXIDE (LIBRIUM) 5 MG capsule TAKE 1 CAPSULE BY MOUTH THREE TIMES A DAY 11/11/15   [provider]  citalopram (CELEXA) 10 MG tablet Take by mouth. 01/14/14    [provider]  dicyclomine (BENTYL) 10 MG capsule Take 1 capsule (10 mg total) by mouth every 6 (six) hours as needed for up to 7 days for spasms (abdominal pain). 07/15/19 07/22/19  Carrie Mew, MD  DULoxetine (CYMBALTA) 20 MG capsule Take 20 mg by mouth every morning.    [provider]  feeding supplement, ENSURE ENLIVE, (ENSURE ENLIVE) LIQD Take 237 mLs by mouth 2 (two) times daily between meals. 05/12/17   Fritzi Mandes, MD  gabapentin (NEURONTIN) 100 MG capsule Take by mouth. 04/04/17   [provider]  KLOR-CON M20 20 MEQ tablet Take 10 mEq by mouth daily.  05/23/16   [provider]  LUMIGAN 0.01 % SOLN Place 1 drop into both eyes at bedtime.     [provider]  metoprolol succinate (TOPROL-XL) 25 MG 24 hr tablet 6.25 mg. 12/14/13   [provider]  PARoxetine (PAXIL) 20 MG tablet  12/11/17   [provider]  potassium chloride (K-DUR) 10 MEQ tablet  10/26/17   [provider]  UNABLE TO FIND Take by mouth.    [provider]     Allergies Azithromycin, Clindamycin, Clindamycin/lincomycin, Hyoscyamine, Lincomycin, Maxitrol [neomycin-polymyxin-dexameth], Other, Timolol, Codeine, and Penicillins   Family History  Problem Relation Age of Onset  . CAD Mother   . Arrhythmia Mother   . Liver cancer Father     Social History Social History   Tobacco Use  . Smoking status: Never Smoker  . Smokeless tobacco: Never Used  Substance Use Topics  . Alcohol use: No  . Drug use: No    Review of Systems  Constitutional:   No fever or chills.  ENT:   No sore throat. No rhinorrhea. Cardiovascular:   No chest pain or syncope. Respiratory:   No dyspnea or cough. Gastrointestinal: Positive as above for abdominal pain.  No vomiting or diarrhea or constipation. Musculoskeletal:   Negative for focal pain or swelling All other systems reviewed and are negative except as documented above in ROS and  HPI.  ____________________________________________   PHYSICAL EXAM:  VITAL SIGNS: ED Triage Vitals  Enc Vitals Group     BP 07/15/19 0918 (!) 155/99     Pulse Rate 07/15/19 0918 73     Resp 07/15/19 0918 (!) 24     Temp 07/15/19 0918 98.1 F (36.7 C)     Temp Source 07/15/19 0918 Oral     SpO2 07/15/19 0918 99 %     Weight 07/15/19 0919 127 lb (57.6 kg)     Height 07/15/19 0919 5\' 4"  (1.626 m)     Head Circumference --      Peak Flow --      Pain Score 07/15/19 0918 8     Pain Loc --      Pain Edu? --      Excl. in [place]? --     Vital signs reviewed, nursing assessments reviewed.   Constitutional:   Alert and oriented. Non-toxic appearance. Eyes:   Conjunctivae are normal. EOMI. PERRL. ENT  Head:   Normocephalic and atraumatic.      Nose:   Wearing a mask.      Mouth/Throat:   Wearing a mask.      Neck:   No meningismus. Full ROM. Hematological/Lymphatic/Immunilogical:   No cervical lymphadenopathy. Cardiovascular:   RRR. Symmetric bilateral radial and DP pulses.  No murmurs. Cap refill less than 2 seconds. Respiratory:   Normal respiratory effort without tachypnea/retractions. Breath sounds are clear and equal bilaterally. No wheezes/rales/rhonchi. Gastrointestinal:   Soft with mild epigastric tenderness. Non distended. There is no CVA tenderness.  No rebound, rigidity, or guarding. Musculoskeletal:   Normal range of motion in all extremities. No joint effusions.  No lower extremity tenderness.  No edema. Neurologic:   Normal speech and language.  Motor grossly intact. No acute focal neurologic deficits are appreciated.  Skin:    Skin is warm, dry and intact. No rash noted.  No petechiae, purpura, or bullae.  ____________________________________________    LABS (pertinent positives/negatives) (all labs ordered are listed, but only abnormal results are displayed) Labs Reviewed  COMPREHENSIVE METABOLIC PANEL - Abnormal; Notable for the following components:       Result Value   Glucose, Bld 107 (*)    Calcium 8.8 (*)    All other components within normal limits  URINALYSIS, COMPLETE (UACMP) WITH MICROSCOPIC - Abnormal; Notable for the following components:   Color, Urine YELLOW (*)    APPearance CLEAR (*)    Leukocytes,Ua SMALL (*)    All other components within normal limits  URINE CULTURE  LIPASE, BLOOD  CBC WITH DIFFERENTIAL/PLATELET  LACTIC ACID, PLASMA   ____________________________________________   EKG    ____________________________________________    RADIOLOGY  CT Angio Abd/Pel w/ and/or w/o  Result Date: 07/15/2019 CLINICAL DATA:  86 year old with abdominal pain. EXAM: CT ANGIOGRAPHY ABDOMEN AND PELVIS WITH CONTRAST AND WITHOUT CONTRAST TECHNIQUE: Multidetector CT imaging of the abdomen and pelvis was performed using the standard protocol during bolus administration of intravenous contrast. Multiplanar reconstructed images and MIPs were obtained and reviewed to evaluate the vascular anatomy. CONTRAST:  61mL OMNIPAQUE IOHEXOL 350 MG/ML SOLN COMPARISON:  08/31/2017 FINDINGS: VASCULAR Aorta: Descending thoracic aorta measures 2.6 cm. Normal caliber of the abdominal aorta without aneurysm or dissection. Celiac: Patent without evidence of aneurysm, dissection, vasculitis or significant stenosis. SMA: Patent without evidence of aneurysm, dissection, vasculitis or significant stenosis. Renals: Single bilateral renal arteries. Atherosclerotic plaque at the origin of the left renal artery with less than 50% stenosis. Right renal artery is patent. No evidence for dissection or FMD in the renal arteries. IMA: Patent Inflow: Atherosclerotic calcifications involving the bilateral iliac arteries without significant stenosis. The common, internal and external iliac arteries are patent. There is a focal ectasia of the distal left common iliac artery measuring up to 1.6 cm. Proximal Outflow: Proximal femoral arteries are patent bilaterally. Veins: Portal  venous system is patent. Normal appearance of the IVC and iliac veins. Evidence for probable venous malformation involving the left portal vein that extends to the surface of the right inferior hepatic lobe. Review of the MIP images confirms the above findings. NON-VASCULAR Lower chest: Mild volume loss in the left lower lobe. No significant pleural effusions. Mitral annular calcifications. Hepatobiliary: Again noted is a hypodensity in the left liver suggestive for a cyst measuring up to 1.6 cm. No acute abnormality to the gallbladder. Pancreas: Unremarkable. No pancreatic ductal dilatation or surrounding inflammatory changes. Spleen: Normal in size without focal abnormality. Adrenals/Urinary Tract: Normal appearance of the  adrenal glands. Hypodensities in the kidneys are suggestive for small cysts. Again noted is exophytic hypodensity in the left kidney lower pole that measures roughly 1.6 cm and minimally changed. Hounsfield units of this left renal exophytic structure are slightly dense on the arterial phase measuring roughly 28 but Hounsfield units measure roughly 7 on the portal venous phase. These differences in Hounsfield units may represent pseudo enhancement. Negative for hydronephrosis. Normal appearance of the urinary bladder. Stomach/Bowel: Probable small hiatal hernia. Large amount of stool in the rectum. No focal bowel inflammation. Negative for bowel obstruction. Lymphatic: No abdominopelvic lymphadenopathy. Reproductive: Patient appears to have a uterus. Stable 2.1 cm hypodensity in the region of the right adnexa could represent a peritoneal inclusion cyst and likely benign based on the stability. No suspicious ovarian or adnexal lesion. Other: Negative for ascites.  Negative for free air. Musculoskeletal: Extensive osteopenia in the pelvis. Stable sclerosis involving the left ischium. Chronic compression fracture involving the L4 vertebral body. IMPRESSION: VASCULAR 1. Atherosclerotic disease in  the aorta and visceral arteries. No significant visceral artery stenosis. Aortic Atherosclerosis (ICD10-I70.0). 2. 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 NON-VASCULAR 1 1. No acute abnormality in the abdomen or pelvis. 2. Probable renal cysts. Exophytic cyst along the left kidney lower pole likely represents a complex cyst. Electronically Signed   By: Markus Daft M.D.   On: 07/15/2019 11:59    ____________________________________________   PROCEDURES Procedures  ____________________________________________  DIFFERENTIAL DIAGNOSIS   Bowel obstruction, volvulus, internal hernia, cystitis, pyelonephritis, kidney stone, mesenteric ischemia, diverticulitis  CLINICAL IMPRESSION / ASSESSMENT AND PLAN / ED COURSE  Medications ordered in the ED: Medications  dicyclomine (BENTYL) capsule 10 mg (has no administration in time range)  sodium chloride 0.9 % bolus 500 mL (500 mLs Intravenous New Bag/Given 07/15/19 1257)  iohexol (OMNIPAQUE) 350 MG/ML injection 75 mL (75 mLs Intravenous Contrast Given 07/15/19 1114)    Pertinent labs & imaging results that were available during my care of the patient were reviewed by me and considered in my medical decision making (see chart for details).  Christy Waters was evaluated in Emergency Department on 07/15/2019 for the symptoms described in the history of present illness. She was evaluated in the context of the global COVID-19 pandemic, which necessitated consideration that the patient might be at risk for infection with the SARS-CoV-2 virus that causes COVID-19. Institutional protocols and algorithms that pertain to the evaluation of patients at risk for COVID-19 are in a state of rapid change based on information released by regulatory bodies including the CDC and federal and state organizations. These policies and algorithms were followed during the patient's care in the ED.   Patient presents with lower abdominal pain, acute on  chronic.  Mild/benign abdominal exam.  Vital signs unremarkable, patient's not in distress, nontoxic in appearance.  Serum labs are unremarkable.  Will check a urinalysis.  Due to her age and comorbidities, will also need to obtain CT scan of the abdomen to evaluate for mesenteric ischemia versus inflammatory pathology.   ----------------------------------------- 1:50 PM on 07/15/2019 -----------------------------------------  CT scan unremarkable, labs unremarkable.  Vital signs remain unremarkable.  Tolerating oral intake.  Stable for discharge home, trial of Bentyl.     ____________________________________________   FINAL CLINICAL IMPRESSION(S) / ED DIAGNOSES    Final diagnoses:  Lower abdominal pain  Aortic atherosclerosis (H[facility])  PAF (paroxysmal atrial fibrillation) [facility])     ED Discharge Orders         Ordered  acetaminophen (TYLENOL) 325 MG tablet  Every 6 hours PRN     07/15/19 1348    dicyclomine (BENTYL) 10 MG capsule  Every 6 hours PRN     07/15/19 1348          Portions of this note were generated with dragon dictation software. Dictation errors may occur despite best attempts at proofreading.   Carrie Mew, MD 07/15/19 1351

## 2019-07-15 NOTE — ED Triage Notes (Signed)
Per ems from home with abd pain.  Has chronic abd pain.  Has been weaker per caregiver.  Vss, hr 85, fsbs 138.  t 98.2.  She had taken tylenol for her abdominal pain. Last bm yesterday.

## 2019-07-15 NOTE — ED Notes (Signed)
Pt gave verbal permission to update her caregiver, Helen Hashimoto, [phone]

## 2019-07-15 NOTE — ED Notes (Signed)
Pt removed from bedpan; unable to void at this time.

## 2019-07-15 NOTE — ED Notes (Signed)
Pt ride has arrived and brought clothes for pt.

## 2019-07-15 NOTE — ED Notes (Signed)
Pt placed on bed pan per her request

## 2019-07-15 NOTE — ED Notes (Signed)
EDP at bedside  

## 2019-07-15 NOTE — Discharge Instructions (Signed)
Your lab tests and CT scan today were all okay.  Please continue your home medications and follow-up with your doctor for continued monitoring of your symptoms.

## 2019-07-16 LAB — URINE CULTURE: Culture: 80000 — AB

## 2019-07-18 ENCOUNTER — Ambulatory Visit: Payer: Medicare Other | Admitting: Podiatry

## 2019-08-01 ENCOUNTER — Ambulatory Visit: Payer: Medicare Other | Admitting: Podiatry

## 2019-08-01 ENCOUNTER — Other Ambulatory Visit: Payer: Self-pay

## 2019-08-01 ENCOUNTER — Encounter: Payer: Self-pay | Admitting: Podiatry

## 2019-08-01 DIAGNOSIS — M79609 Pain in unspecified limb: Secondary | ICD-10-CM

## 2019-08-01 DIAGNOSIS — M79676 Pain in unspecified toe(s): Secondary | ICD-10-CM

## 2019-08-01 DIAGNOSIS — D689 Coagulation defect, unspecified: Secondary | ICD-10-CM | POA: Diagnosis not present

## 2019-08-01 DIAGNOSIS — B351 Tinea unguium: Secondary | ICD-10-CM | POA: Diagnosis not present

## 2019-08-01 NOTE — Progress Notes (Signed)
Complaint:  Visit Type: Patient returns to my office for continued preventative foot care services. Complaint: Patient states" my nails have grown long and thick and become painful to walk and wear shoes" Patient has been diagnosed with peripheral neuropathy. The patient presents for preventative foot care services. No changes to ROS.  Patient is taking eliquiss.  Podiatric Exam: Vascular: dorsalis pedis and posterior tibial pulses are palpable bilateral. Capillary return is immediate. Temperature gradient is WNL. Skin turgor WNL   Venous stasis with venous pathology. Sensorium: Normal Semmes Weinstein monofilament test. Normal tactile sensation bilaterally. Nail Exam: Pt has thick disfigured discolored nails with subungual debris noted bilateral entire nail hallux through fifth toenails Ulcer Exam: There is no evidence of ulcer or pre-ulcerative changes or infection. Orthopedic Exam: Muscle tone and strength are WNL. No limitations in general ROM. No crepitus or effusions noted. Foot type and digits show no abnormalities. Bony prominences are unremarkable. Skin: No Porokeratosis. No infection or ulcers  Diagnosis:  Onychomycosis, , Pain in right toe, pain in left toes  Treatment & Plan Procedures and Treatment: Consent by patient was obtained for treatment procedures. The patient understood the discussion of treatment and procedures well. All questions were answered thoroughly reviewed. Debridement of mycotic and hypertrophic toenails, 1 through 5 bilateral and clearing of subungual debris. No ulceration, no infection noted.  Return Visit-Office Procedure: Patient instructed to return to the office for a follow up visit 3 months for continued evaluation and treatment.    Gardiner Barefoot DPM

## 2019-09-15 ENCOUNTER — Emergency Department: Payer: Medicare Other

## 2019-09-15 ENCOUNTER — Observation Stay
Admission: EM | Admit: 2019-09-15 | Discharge: 2019-09-17 | Disposition: A | Discharge status: Home Health | Payer: Medicare Other | Attending: Internal Medicine | Admitting: Internal Medicine

## 2019-09-15 ENCOUNTER — Other Ambulatory Visit: Payer: Self-pay

## 2019-09-15 DIAGNOSIS — N390 Urinary tract infection, site not specified: Secondary | ICD-10-CM | POA: Diagnosis not present

## 2019-09-15 DIAGNOSIS — I480 Paroxysmal atrial fibrillation: Secondary | ICD-10-CM | POA: Diagnosis not present

## 2019-09-15 DIAGNOSIS — I160 Hypertensive urgency: Secondary | ICD-10-CM | POA: Diagnosis not present

## 2019-09-15 DIAGNOSIS — E785 Hyperlipidemia, unspecified: Secondary | ICD-10-CM | POA: Diagnosis not present

## 2019-09-15 DIAGNOSIS — Z66 Do not resuscitate: Secondary | ICD-10-CM | POA: Insufficient documentation

## 2019-09-15 DIAGNOSIS — G609 Hereditary and idiopathic neuropathy, unspecified: Secondary | ICD-10-CM | POA: Diagnosis not present

## 2019-09-15 DIAGNOSIS — Z85828 Personal history of other malignant neoplasm of skin: Secondary | ICD-10-CM | POA: Insufficient documentation

## 2019-09-15 DIAGNOSIS — F0390 Unspecified dementia without behavioral disturbance: Secondary | ICD-10-CM | POA: Diagnosis not present

## 2019-09-15 DIAGNOSIS — I700 Atherosclerosis of aorta: Secondary | ICD-10-CM | POA: Diagnosis not present

## 2019-09-15 DIAGNOSIS — E860 Dehydration: Secondary | ICD-10-CM | POA: Insufficient documentation

## 2019-09-15 DIAGNOSIS — I10 Essential (primary) hypertension: Secondary | ICD-10-CM | POA: Diagnosis not present

## 2019-09-15 DIAGNOSIS — R55 Syncope and collapse: Principal | ICD-10-CM

## 2019-09-15 DIAGNOSIS — Z20822 Contact with and (suspected) exposure to covid-19: Secondary | ICD-10-CM | POA: Diagnosis not present

## 2019-09-15 DIAGNOSIS — R2681 Unsteadiness on feet: Secondary | ICD-10-CM

## 2019-09-15 DIAGNOSIS — K580 Irritable bowel syndrome with diarrhea: Secondary | ICD-10-CM | POA: Insufficient documentation

## 2019-09-15 DIAGNOSIS — Z79899 Other long term (current) drug therapy: Secondary | ICD-10-CM | POA: Diagnosis not present

## 2019-09-15 LAB — URINALYSIS, COMPLETE (UACMP) WITH MICROSCOPIC
Bilirubin Urine: NEGATIVE
Glucose, UA: NEGATIVE mg/dL
Hgb urine dipstick: NEGATIVE
Ketones, ur: NEGATIVE mg/dL
Nitrite: NEGATIVE
Protein, ur: 30 mg/dL — AB
Specific Gravity, Urine: 1.023 (ref 1.005–1.030)
WBC, UA: >50 WBC/hpf — ABNORMAL HIGH (ref 0–5)
pH: 5 (ref 5.0–8.0)

## 2019-09-15 LAB — CBC WITH DIFFERENTIAL/PLATELET
Abs Immature Granulocytes: 0.02 10*3/uL (ref 0.00–0.07)
Basophils Absolute: 0.1 10*3/uL (ref 0.0–0.1)
Basophils Relative: 1 %
Eosinophils Absolute: 0.2 10*3/uL (ref 0.0–0.5)
Eosinophils Relative: 3 %
HCT: 34.8 % — ABNORMAL LOW (ref 36.0–46.0)
Hemoglobin: 11.2 g/dL — ABNORMAL LOW (ref 12.0–15.0)
Immature Granulocytes: 0 %
Lymphocytes Relative: 37 %
Lymphs Abs: 2.1 10*3/uL (ref 0.7–4.0)
MCH: 31.5 pg (ref 26.0–34.0)
MCHC: 32.2 g/dL (ref 30.0–36.0)
MCV: 97.8 fL (ref 80.0–100.0)
Monocytes Absolute: 0.5 10*3/uL (ref 0.1–1.0)
Monocytes Relative: 9 %
Neutro Abs: 2.8 10*3/uL (ref 1.7–7.7)
Neutrophils Relative %: 50 %
Platelets: 206 10*3/uL (ref 150–400)
RBC: 3.56 MIL/uL — ABNORMAL LOW (ref 3.87–5.11)
RDW: 13.5 % (ref 11.5–15.5)
WBC: 5.7 10*3/uL (ref 4.0–10.5)
nRBC: 0 % (ref 0.0–0.2)

## 2019-09-15 LAB — LIPASE, BLOOD: Lipase: 33 U/L (ref 11–51)

## 2019-09-15 LAB — COMPREHENSIVE METABOLIC PANEL
ALT: 19 U/L (ref 0–44)
AST: 33 U/L (ref 15–41)
Albumin: 3.5 g/dL (ref 3.5–5.0)
Alkaline Phosphatase: 117 U/L (ref 38–126)
Anion gap: 7 (ref 5–15)
BUN: 19 mg/dL (ref 8–23)
CO2: 27 mmol/L (ref 22–32)
Calcium: 8.9 mg/dL (ref 8.9–10.3)
Chloride: 106 mmol/L (ref 98–111)
Creatinine, Ser: 0.67 mg/dL (ref 0.44–1.00)
GFR calc Af Amer: >60 mL/min (ref >60)
GFR calc non Af Amer: >60 mL/min (ref >60)
Glucose, Bld: 124 mg/dL — ABNORMAL HIGH (ref 70–99)
Potassium: 3.3 mmol/L — ABNORMAL LOW (ref 3.5–5.1)
Sodium: 140 mmol/L (ref 135–145)
Total Bilirubin: 0.8 mg/dL (ref 0.3–1.2)
Total Protein: 6.9 g/dL (ref 6.5–8.1)

## 2019-09-15 LAB — BLOOD GAS, VENOUS
Acid-base deficit: 4.3 mmol/L — ABNORMAL HIGH (ref 0.0–2.0)
Bicarbonate: 21.6 mmol/L (ref 20.0–28.0)
O2 Saturation: 77.6 %
Patient temperature: 37
pCO2, Ven: 42 mmHg — ABNORMAL LOW (ref 44.0–60.0)
pH, Ven: 7.32 (ref 7.250–7.430)
pO2, Ven: 46 mmHg — ABNORMAL HIGH (ref 32.0–45.0)

## 2019-09-15 LAB — TROPONIN I (HIGH SENSITIVITY)
Troponin I (High Sensitivity): 10 ng/L (ref <18)
Troponin I (High Sensitivity): 7 ng/L (ref <18)

## 2019-09-15 MED ORDER — CIPROFLOXACIN HCL 500 MG PO TABS
250.0000 mg | ORAL_TABLET | Freq: Two times a day (BID) | ORAL | Status: DC
  Administered 2019-09-15 – 2019-09-17 (×4): 250 mg via ORAL
  Filled 2019-09-15 (×4): qty 1

## 2019-09-15 MED ORDER — CLONAZEPAM 0.5 MG PO TABS: 0.5000 mg | ORAL_TABLET | Freq: Two times a day (BID) | ORAL | Status: DC

## 2019-09-15 MED ORDER — DULOXETINE HCL 20 MG PO CPEP: 20.0000 mg | ORAL_CAPSULE | Freq: Every morning | ORAL | Status: DC

## 2019-09-15 MED ORDER — SODIUM CHLORIDE 0.9% FLUSH: 3.0000 mL | INTRAVENOUS | Status: DC | PRN

## 2019-09-15 MED ORDER — ACETAMINOPHEN 325 MG PO TABS
650.0000 mg | ORAL_TABLET | Freq: Four times a day (QID) | ORAL | Status: DC | PRN
  Administered 2019-09-16: 650 mg via ORAL
  Filled 2019-09-15: qty 2

## 2019-09-15 MED ORDER — DICYCLOMINE HCL 10 MG PO CAPS
10.0000 mg | ORAL_CAPSULE | Freq: Four times a day (QID) | ORAL | Status: DC | PRN
  Administered 2019-09-16 – 2019-09-17 (×3): 10 mg via ORAL
  Filled 2019-09-15 (×4): qty 1

## 2019-09-15 MED ORDER — ENSURE ENLIVE PO LIQD
237.0000 mL | Freq: Two times a day (BID) | ORAL | Status: DC
  Administered 2019-09-16 – 2019-09-17 (×3): 237 mL via ORAL

## 2019-09-15 MED ORDER — SODIUM CHLORIDE 0.9 % IV BOLUS
250.0000 mL | Freq: Once | INTRAVENOUS | Status: AC
  Administered 2019-09-15: 21:00:00 250 mL via INTRAVENOUS

## 2019-09-15 MED ORDER — CITALOPRAM HYDROBROMIDE 20 MG PO TABS: 10.0000 mg | ORAL_TABLET | Freq: Every day | ORAL | Status: DC

## 2019-09-15 MED ORDER — POLYVINYL ALCOHOL 1.4 % OP SOLN
1.0000 [drp] | Freq: Two times a day (BID) | OPHTHALMIC | Status: DC | PRN
  Filled 2019-09-15: qty 15

## 2019-09-15 MED ORDER — SODIUM CHLORIDE 0.9% FLUSH
3.0000 mL | Freq: Two times a day (BID) | INTRAVENOUS | Status: DC
  Administered 2019-09-16 – 2019-09-17 (×3): 3 mL via INTRAVENOUS

## 2019-09-15 MED ORDER — APIXABAN 2.5 MG PO TABS: 2.5000 mg | ORAL_TABLET | Freq: Two times a day (BID) | ORAL | Status: DC

## 2019-09-15 MED ORDER — SODIUM CHLORIDE 0.9 % IV SOLN: 250.0000 mL | INTRAVENOUS | Status: DC | PRN

## 2019-09-15 MED ORDER — GABAPENTIN 100 MG PO CAPS: 100.0000 mg | ORAL_CAPSULE | Freq: Every day | ORAL | Status: DC

## 2019-09-15 MED ORDER — METOPROLOL SUCCINATE ER 50 MG PO TB24: 25.0000 mg | ORAL_TABLET | Freq: Every day | ORAL | Status: DC

## 2019-09-15 MED ORDER — SODIUM CHLORIDE 0.9 % IV BOLUS
500.0000 mL | Freq: Once | INTRAVENOUS | Status: AC
  Administered 2019-09-15: 16:00:00 500 mL via INTRAVENOUS

## 2019-09-15 MED ORDER — POTASSIUM CHLORIDE CRYS ER 20 MEQ PO TBCR: 10.0000 meq | EXTENDED_RELEASE_TABLET | Freq: Every day | ORAL | Status: DC

## 2019-09-15 MED ORDER — HYDRALAZINE HCL 10 MG PO TABS: 10.0000 mg | ORAL_TABLET | Freq: Two times a day (BID) | ORAL | Status: DC

## 2019-09-15 MED ORDER — PHENAZOPYRIDINE HCL 100 MG PO TABS
100.0000 mg | ORAL_TABLET | Freq: Two times a day (BID) | ORAL | Status: DC
  Administered 2019-09-15 – 2019-09-17 (×4): 100 mg via ORAL
  Filled 2019-09-15 (×5): qty 1

## 2019-09-15 MED ORDER — IRBESARTAN 75 MG PO TABS
75.0000 mg | ORAL_TABLET | Freq: Every day | ORAL | Status: DC
  Administered 2019-09-16 – 2019-09-17 (×2): 75 mg via ORAL
  Filled 2019-09-15 (×2): qty 1

## 2019-09-15 MED ORDER — POTASSIUM CHLORIDE CRYS ER 10 MEQ PO TBCR
10.0000 meq | EXTENDED_RELEASE_TABLET | Freq: Every day | ORAL | Status: DC
  Administered 2019-09-16 – 2019-09-17 (×2): 10 meq via ORAL
  Filled 2019-09-15 (×4): qty 1

## 2019-09-15 MED ORDER — SODIUM CHLORIDE 0.9 % IV SOLN
1.0000 g | Freq: Once | INTRAVENOUS | Status: AC
  Administered 2019-09-15: 18:00:00 1 g via INTRAVENOUS
  Filled 2019-09-15: qty 10

## 2019-09-15 MED ORDER — PAROXETINE HCL 20 MG PO TABS
20.0000 mg | ORAL_TABLET | Freq: Every day | ORAL | Status: DC
  Administered 2019-09-16 – 2019-09-17 (×2): 20 mg via ORAL
  Filled 2019-09-15 (×2): qty 1

## 2019-09-15 MED ORDER — ALPRAZOLAM 0.5 MG PO TABS
0.2500 mg | ORAL_TABLET | Freq: Every evening | ORAL | Status: DC | PRN
  Filled 2019-09-15: qty 1

## 2019-09-15 NOTE — ED Notes (Signed)
Transportation requested  

## 2019-09-15 NOTE — ED Provider Notes (Signed)
[facility]ERGENCY DEPARTMENT Provider Note   CSN: [id number] Arrival date & time: 09/15/19  1558     History Chief Complaint  Patient presents with  . Altered Mental Status  . Near Syncope    Christy Waters is a 86 y.o. female history of hypertension, A. fib on Eliquis, here presenting with syncope.  Patient lives at home and has a caregiver that comes to take care of her.  She is demented at baseline.  Around 3:30 PM, she was sitting on the toilet and was ready have a bowel movement.  Caregiver heard a loud noise and went over and she was slumped over.  She tried to sit her up and she passed out.  Patient is confused unable to give much history.  Per the caregiver at bedside, patient is still very confused and does not look well.  The history is provided by the patient, the EMS personnel and a caregiver.  Level V caveat- dementia      Past Medical History:  Diagnosis Date  . Anemia    distant past  . Arthritis    fingers, hips  . Cancer ([place])    skin cancer; some spots on left side of face; been 5 years since last treated;   . Headache   . History of echocardiogram    a. TTE 04/06/17: EF > 55%, no RWMA, no LA mass, severe TR, RV systolic dysfunction, elevated PASP; b. TTE 04/2017: EF 65-70%, no RWMA, Gr1DD, calcified mitral annulus w/ mild MR, mild to mod dilated LA w/ irregular, echodense material in the LA incompletely visualized, possibly representing artifact vs thrombus, or intracardiac mass, rec TEE, mod TR, mildly dilated RV, inc PASP  . History of nuclear stress test    a. 12/18: no sig ischemia, low risk  . HLD (hyperlipidemia)   . Left atrial mass    a. see echo from 04/2017 - ? mass vs LA thrombus in the setting of PAF -->Eliquis.  . Osteoporosis   . PAF (paroxysmal atrial fibrillation) ([place])    a. diagnosed 05/10/2017; b. CHADS2VASc --> 3 (age x 2, female)--> Eliquis 2.5 (age/wt).    Patient Active Problem List   Diagnosis  Date Noted  . Pelvic pain in female 03/20/2018  . Cor pulmonale ([place]) 09/19/2017  . Low serum vitamin D 06/19/2017  . History of atrial fibrillation 06/19/2017  . Chest pain 05/10/2017  . Moderate episode of recurrent major depressive disorder ([place]) 04/04/2017  . Adult idiopathic generalized osteoporosis 04/13/2016  . 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 Irritable bowel syndrome with diarrhea 05/20/2014  . Abdominal pain, generalized 04/01/2014  . Atherosclerosis of aorta ([place]) 03/05/2014  . 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 Idiopathic peripheral neuropathy 03/05/2014  . Abdominal pain of unknown cause 02/06/2014  . MI (mitral incompetence) 02/06/2014  . Stress incontinence 02/06/2014  . Fibrosis of lung ([place]) 10/03/2013  . Generalized OA 10/03/2013  . HLD (hyperlipidemia) 10/03/2013  . BP (high blood pressure) 10/03/2013  . OP (osteoporosis) 10/03/2013  . Beat, premature ventricular 10/03/2013  . Ventricular premature depolarization 10/03/2013    Past Surgical History:  Procedure Laterality Date  . CATARACT EXTRACTION W/PHACO Left 09/14/2015   Procedure: CATARACT EXTRACTION PHACO AND INTRAOCULAR LENS PLACEMENT (IOC);  Surgeon: Ronnell Freshwater, MD;  Location: [facility];  Service: Ophthalmology;  Laterality: Left;  [place] Right    cataract surgery schedule for Easter Monday 2017;      OB History  No obstetric history on file.     Family History  Problem Relation Age of Onset  . CAD Mother   . Arrhythmia Mother   . Liver cancer Father     Social History   Tobacco Use  . Smoking status: Never Smoker  . Smokeless tobacco: Never Used  Substance Use Topics  . Alcohol use: No  . Drug use: No    Home Medications Prior to Admission medications   Medication Sig Start Date End Date Taking? Authorizing Provider  acetaminophen (TYLENOL) 325 MG tablet Take 2 tablets (650 mg total) by mouth every 6 (six) hours as needed. 07/15/19    Carrie Mew, MD  ALPRAZolam Duanne Moron) 0.25 MG tablet  12/11/17   [provider]  apixaban (ELIQUIS) 2.5 MG TABS tablet Take 1 tablet (2.5 mg total) by mouth 2 (two) times daily. 05/11/17   Fritzi Mandes, MD  bifidobacterium infantis (ALIGN) capsule Take by mouth.    [provider]  butalbital-acetaminophen-caffeine (FIORICET) 50-325-40 MG tablet Take 1 tablet by mouth every 4 (four) hours as needed. 07/17/19   [provider]  carboxymethylcellulose (REFRESH PLUS) 0.5 % SOLN Apply 1-2 drops to eye 2 (two) times daily as needed.     [provider]  chlordiazePOXIDE (LIBRIUM) 5 MG capsule TAKE 1 CAPSULE BY MOUTH THREE TIMES A DAY 11/11/15   [provider]  ciprofloxacin (CIPRO) 250 MG tablet Take 250 mg by mouth 2 (two) times daily. 05/09/19   [provider]  citalopram (CELEXA) 10 MG tablet Take by mouth. 01/14/14   [provider]  clonazePAM (KLONOPIN) 0.5 MG tablet Take 0.5 mg by mouth 2 (two) times daily. 05/08/19   [provider]  dicyclomine (BENTYL) 10 MG capsule Take 1 capsule (10 mg total) by mouth every 6 (six) hours as needed for up to 7 days for spasms (abdominal pain). 07/15/19 07/22/19  Carrie Mew, MD  DULoxetine (CYMBALTA) 20 MG capsule Take 20 mg by mouth every morning.    [provider]  ergocalciferol (VITAMIN D2) 1.25 MG (50000 UT) capsule Take by mouth. 05/30/19   [provider]  feeding supplement, ENSURE ENLIVE, (ENSURE ENLIVE) LIQD Take 237 mLs by mouth 2 (two) times daily between meals. 05/12/17   Fritzi Mandes, MD  gabapentin (NEURONTIN) 100 MG capsule Take by mouth. 04/04/17   [provider]  hydrALAZINE (APRESOLINE) 10 MG tablet Take 10 mg by mouth 2 (two) times daily. 06/12/19   [provider]  KLOR-CON M20 20 MEQ tablet Take 10 mEq by mouth daily.  05/23/16   [provider]  LUMIGAN 0.01 % SOLN Place 1 drop into both eyes at bedtime.     [provider]  metoprolol succinate (TOPROL-XL) 25 MG 24 hr tablet Take by mouth. 06/17/19   [provider]  olmesartan (BENICAR) 20 MG tablet Take 10 mg by mouth at bedtime. 06/29/19   [provider]  PARoxetine (PAXIL) 20 MG tablet  12/11/17   [provider]  potassium chloride (K-DUR) 10 MEQ tablet  10/26/17   [provider]    Allergies    Azithromycin, Clindamycin, Clindamycin/lincomycin, Hyoscyamine, Lincomycin, Maxitrol [neomycin-polymyxin-dexameth], Other, Timolol, Codeine, and Penicillins  Review of Systems   Review of Systems  Cardiovascular: Positive for near-syncope.  Neurological: Positive for syncope.  Psychiatric/Behavioral: Positive for confusion.  All other systems reviewed and are negative.   Physical Exam Updated Vital Signs BP 129/70   Pulse (!) 57   Temp 98.7 F (  37.1 C) (Oral)   Resp (!) 22   Ht 5\' 5"  (1.651 m)   Wt 57.6 kg   SpO2 96%   BMI 21.13 kg/m   Physical Exam Vitals and nursing note reviewed.  Constitutional:      Comments: Demented, confused   HENT:     Head: Normocephalic.     Comments: No obvious scalp hematoma     Nose: Nose normal.     Mouth/Throat:     Mouth: Mucous membranes are dry.  Eyes:     Extraocular Movements: Extraocular movements intact.     Pupils: Pupils are equal, round, and reactive to light.  Cardiovascular:     Rate and Rhythm: Normal rate and regular rhythm.     Pulses: Normal pulses.     Heart sounds: Normal heart sounds.  Pulmonary:     Effort: Pulmonary effort is normal.     Breath sounds: Normal breath sounds.  Abdominal:     General: Abdomen is flat.     Palpations: Abdomen is soft.  Musculoskeletal:        General: Normal range of motion.     Cervical back: Normal range of motion.  Skin:    General: Skin is warm.     Capillary Refill: Capillary refill takes less than 2 seconds.  Neurological:     Comments: Confused, A & O x 2, moving all extremities.  Difficulty  following commands.  Psychiatric:     Comments: Unable      ED Results / Procedures / Treatments   Labs (all labs ordered are listed, but only abnormal results are displayed) Labs Reviewed  CBC WITH DIFFERENTIAL/PLATELET - Abnormal; Notable for the following components:      Result Value   RBC 3.56 (*)    Hemoglobin 11.2 (*)    HCT 34.8 (*)    All other components within normal limits  COMPREHENSIVE METABOLIC PANEL - Abnormal; Notable for the following components:   Potassium 3.3 (*)    Glucose, Bld 124 (*)    All other components within normal limits  BLOOD GAS, VENOUS - Abnormal; Notable for the following components:   pCO2, Ven 42 (*)    pO2, Ven 46.0 (*)    Acid-base deficit 4.3 (*)    All other components within normal limits  URINALYSIS, COMPLETE (UACMP) WITH MICROSCOPIC - Abnormal; Notable for the following components:   Color, Urine YELLOW (*)    APPearance CLOUDY (*)    Protein, ur 30 (*)    Leukocytes,Ua LARGE (*)    WBC, UA >50 (*)    Bacteria, UA RARE (*)    All other components within normal limits  URINE CULTURE  SARS CORONAVIRUS 2 (TAT 6-24 HRS)  LIPASE, BLOOD  TROPONIN I (HIGH SENSITIVITY)  TROPONIN I (HIGH SENSITIVITY)    EKG EKG Interpretation  Date/Time:  Sunday September 15 2019 16:01:37 EDT Ventricular Rate:  57 PR Interval:    QRS Duration: 125 QT Interval:  475 QTC Calculation: 463 R Axis:   -72 Text Interpretation: Sinus rhythm Nonspecific IVCD with LAD Inferior infarct, old Lateral leads are also involved No significant change since last tracing Confirmed by Wandra Arthurs [phone]) on 09/15/2019 4:08:54 PM   Radiology CT Head Wo Contrast  Result Date: 09/15/2019 CLINICAL DATA:  Altered mental status. Malaise. EXAM: CT HEAD WITHOUT CONTRAST TECHNIQUE: Contiguous axial images were obtained from the base of the skull through the vertex without intravenous contrast. COMPARISON:  07/13/2013 FINDINGS: Brain: Mild-to-moderate enlargement  of the  ventricles and subarachnoid spaces. Mild-to-moderate patchy white matter low density in both cerebral hemispheres. No intracranial hemorrhage, mass lesion or CT evidence of acute infarction. Vascular: No hyperdense vessel or unexpected calcification. Skull: Normal. Negative for fracture or focal lesion. Sinuses/Orbits: Status post bilateral cataract extraction. Unremarkable bones and included paranasal sinuses. Other: None. IMPRESSION: 1. No acute abnormality. 2. Mild to moderate diffuse cerebral and cerebellar atrophy. 3. Mild to moderate chronic small vessel white matter ischemic changes in both cerebral hemispheres. Electronically Signed   By: Claudie Revering M.D.   On: 09/15/2019 17:16   DG Chest Port 1 View  Result Date: 09/15/2019 CLINICAL DATA:  Altered mental status. Weakness. Malaise. EXAM: PORTABLE CHEST 1 VIEW COMPARISON:  05/23/2019 FINDINGS: Stable enlarged cardiac silhouette and tortuous aorta. Stable hyperexpansion of the lungs with diffuse peribronchial thickening and accentuation of the interstitial markings. Interval small amount of linear density in the right mid lung zone. Diffuse osteopenia. IMPRESSION: 1. Interval small amount of linear atelectasis or scarring in the right mid lung zone. 2. Stable cardiomegaly and changes of COPD and chronic bronchitis. Electronically Signed   By: Claudie Revering M.D.   On: 09/15/2019 16:33    Procedures Procedures (including critical care time)  Medications Ordered in ED Medications  cefTRIAXone (ROCEPHIN) 1 g in sodium chloride 0.9 % 100 mL IVPB (1 g Intravenous New Bag/Given 09/15/19 1756)  sodium chloride 0.9 % bolus 500 mL (0 mLs Intravenous Stopped 09/15/19 1757)    ED Course  I have reviewed the triage vital signs and the nursing notes.  Pertinent labs & imaging results that were available during my care of the patient were reviewed by me and considered in my medical decision making (see chart for details).    MDM Rules/Calculators/A&P                       Christy Waters is a 86 y.o. female is here with syncope.  Patient was on the commode and had an episode of syncope .  Patient is still not back to baseline per the caregiver.  Will get labs and CT head and urinalysis and chest x-ray.  Patient received Covid vaccine already.  Additional history obtained:  Additional history obtained from caregiver.. Previous records obtained and reviewed  Lab Tests:  I Ordered, reviewed, and interpreted labs, which included:  CBC, CMP, Trop, UA  Imaging Studies ordered:  I ordered imaging studies which included CT head, CXR, I independently visualized and interpreted imaging which showed no obvious head bleed, no pneumonia  Medicines ordered:  I ordered medication IVF, rocephin  For dehydration, UTI   Reevaluation: After the interventions stated above, I reevaluated the patient and found UTI  Consultations Obtained: I consulted Dr. Crissie Sickles and discussed lab and imaging findings  6:16 PM Labs are unremarkable.  Her CT head showed no bleed.  Urinalysis showed UTI.  Given Rocephin.  Patient is now back to baseline per the caregiver at bedside.  Will admit for syncope, UTI.    Final Clinical Impression(s) / ED Diagnoses Final diagnoses:  None    Rx / DC Orders ED Discharge Orders    None       Drenda Freeze, MD 09/15/19 [phone]

## 2019-09-15 NOTE — Plan of Care (Signed)
  Problem: Activity: Goal: Interest or engagement in activities will improve Outcome: Progressing Goal: Sleeping patterns will improve Outcome: Progressing   

## 2019-09-15 NOTE — H&P (Signed)
History and Physical    Christy Waters [id number] DOB: Dec 24, 1918 DOA: 09/15/2019  PCP: Rusty Aus, MD  Patient coming from: home  I have personally briefly reviewed patient's old medical records in [place]  Chief Complaint: syncope  HPI: Christy Waters is a 86 y.o. female with medical history significant of  hypertension, A. fib on Eliquis, here presenting with syncope.  Patient lives at home and has a caregiver that comes to take care of her.  She is demented at baseline. Marland Kitchen She was seen by her neurologist 09/10/19 for imbalance (note not available) per the daughter no definite etiology was presented. .  Around 3:30 PM on the day of admission, she was sitting on the toilet and was ready to have a bowel movement.  Caregiver heard a loud noise and went over and she was slumped over.  She tried to sit her up and she passed out.  Patient was confused unable to give much history.  Per the caregiver at bedside, patient is still very confused and does not look well. No c/o or report of chest pain, respiratory trouble, no report of seizure like activity. Per the daughter the patient had been c/o dysuria. Her PO intake of fluids had been poor for a couple of days.   ED Course: Patient hemodynamically stable, though bradycardic. She is afebrile. CT head negaitve, CXR w/o active disease. Lab remarkable for glucose of 124, U/A positive with >50 WBC hpf, large LE, rare bacteria. EKG reveals sinus rhythm w/o acute changes. She has been given 250 ml NS. She was given 1 g Rocephin. She remains confused and weak. TRH is asked to admit to observation to ensure that she is stable for return to home.    Review of Systems: As per HPI otherwise 10 point review of systems negative.    Past Medical History:  Diagnosis Date  . Anemia    distant past  . Arthritis    fingers, hips  . Cancer ([place])    skin cancer; some spots on left side of face; been 5 years since last treated;   .  Headache   . History of echocardiogram    a. TTE 04/06/17: EF > 55%, no RWMA, no LA mass, severe TR, RV systolic dysfunction, elevated PASP; b. TTE 04/2017: EF 65-70%, no RWMA, Gr1DD, calcified mitral annulus w/ mild MR, mild to mod dilated LA w/ irregular, echodense material in the LA incompletely visualized, possibly representing artifact vs thrombus, or intracardiac mass, rec TEE, mod TR, mildly dilated RV, inc PASP  . History of nuclear stress test    a. 12/18: no sig ischemia, low risk  . HLD (hyperlipidemia)   . Left atrial mass    a. see echo from 04/2017 - ? mass vs LA thrombus in the setting of PAF -->Eliquis.  . Osteoporosis   . PAF (paroxysmal atrial fibrillation) ([place])    a. diagnosed 05/10/2017; b. CHADS2VASc --> 3 (age x 2, female)--> Eliquis 2.5 (age/wt).    Past Surgical History:  Procedure Laterality Date  . CATARACT EXTRACTION W/PHACO Left 09/14/2015   Procedure: CATARACT EXTRACTION PHACO AND INTRAOCULAR LENS PLACEMENT (IOC);  Surgeon: Ronnell Freshwater, MD;  Location: [facility];  Service: Ophthalmology;  Laterality: Left;  [place] Right    cataract surgery schedule for Easter Monday 2017;    Soc Hx: widowed after 60+ years of marriage. She has four children and innumberable grandchildren and many great-grandchildren. She worked as  a Regulatory affairs officer. She lives in her own home and has full time care. Her family checks on her frequently and provides transportation to doctor's appointments.    reports that she has never smoked. She has never used smokeless tobacco. She reports that she does not drink alcohol or use drugs.  Allergies  Allergen Reactions  . Azithromycin Shortness Of Breath and Other (See Comments)    Syncope Can not stand up, feels very sick, weak Syncope Syncope Can not stand up, feels very sick, weak Syncope Syncope Can not stand up, feels very sick, weak Syncope Can not stand up, feels very sick, weak Syncope  .  Clindamycin Other (See Comments)    GI problem  GI problem  GI problem   . Clindamycin/Lincomycin   . Hyoscyamine Diarrhea  . Lincomycin Other (See Comments)  . Maxitrol [Neomycin-Polymyxin-Dexameth]     Eye irritation  . Other Other (See Comments)    Eye irritation Eye irritation Eye irritation  . Timolol Other (See Comments)    Eye irritation Eye irritation Eye irritation Eye irritation  . Codeine Other (See Comments) and Nausea Only    Altered mental status FATIGUE, SICK FEELING Altered mental status Altered mental status FATIGUE, SICK FEELING  . Penicillins Rash    Has patient had a PCN reaction causing immediate rash, facial/tongue/throat swelling, SOB or lightheadedness with hypotension: Yes Has patient had a PCN reaction causing severe rash involving mucus membranes or skin necrosis: No Has patient had a PCN reaction that required hospitalization: No Has patient had a PCN reaction occurring within the last 10 years: No If all of the above answers are "NO", then may proceed with Cephalosporin use.    Family History  Problem Relation Age of Onset  . CAD Mother   . Arrhythmia Mother   . Liver cancer Father     Prior to Admission medications   Medication Sig Start Date End Date Taking? Authorizing Provider  acetaminophen (TYLENOL) 325 MG tablet Take 2 tablets (650 mg total) by mouth every 6 (six) hours as needed. 07/15/19  Yes Carrie Mew, MD  ALPRAZolam Duanne Moron) 0.25 MG tablet Take 0.25 mg by mouth 2 (two) times daily with a meal.    Yes [provider]  dicyclomine (BENTYL) 20 MG tablet Take 20 mg by mouth 4 (four) times daily as needed for spasms.   Yes [provider]  feeding supplement, ENSURE ENLIVE, (ENSURE ENLIVE) LIQD Take 237 mLs by mouth 2 (two) times daily between meals. 05/12/17  Yes Fritzi Mandes, MD  LUMIGAN 0.01 % SOLN Place 1 drop into both eyes at bedtime.    Yes [provider]  olmesartan (BENICAR) 20 MG tablet Take 20  mg by mouth daily.    Yes [provider]  PARoxetine (PAXIL) 20 MG tablet Take 20 mg by mouth daily.    Yes [provider]  potassium chloride (K-DUR) 10 MEQ tablet Take 10 mEq by mouth daily.    Yes [provider]  Probiotic Product (ALIGN) 4 MG CAPS Take 1 capsule by mouth daily.    Yes [provider]    Physical Exam: Vitals:   09/15/19 1745 09/15/19 1800 09/15/19 1815 09/15/19 1900  BP:  138/72  (!) 153/88  Pulse: (!) 57 (!) 59 (!) 56 68  Resp: (!) 22 (!) 23 19 (!) 22  Temp:      TempSrc:      SpO2: 96% 97% 97% 94%  Weight:      Height:  Constitutional: NAD, calm, comfortable Vitals:   09/15/19 1745 09/15/19 1800 09/15/19 1815 09/15/19 1900  BP:  138/72  (!) 153/88  Pulse: (!) 57 (!) 59 (!) 56 68  Resp: (!) 22 (!) 23 19 (!) 22  Temp:      TempSrc:      SpO2: 96% 97% 97% 94%  Weight:      Height:       General:  Elderly woman a bit disheveled but in no distress. She is bright and conversational.  Eyes: PERRL, lids and conjunctivae normal ENMT: Mucous membranes are moist. Posterior pharynx clear of any exudate or lesions.Normal dentition.  Neck: normal, supple, no masses, no thyromegaly Respiratory: clear to auscultation bilaterally, no wheezing, no crackles. Normal respiratory effort. No accessory muscle use.  Cardiovascular: Regular rate and rhythm, no murmurs / rubs / gallops. No extremity edema. trace pedal pulses and feet are cold. No carotid bruits.  Abdomen:  Tenderness in suprapubic area to palpation, no masses palpated. No hepatosplenomegaly. Bowel sounds positive.  Musculoskeletal: no clubbing / cyanosis. Marked interosseous wasting both hands and toes.  No joint deformity upper and lower extremities. Good ROM, no contractures. Poor muscle tone.  Skin: no rashes, ulcers. No induration. Scrap on left shin with eschar. Large dark seborrheic type lesion anterior left neck.  Neurologic: CN 2-12 grossly intact. Sensation  intact,   Psychiatric:  Speech is clear. Remote memory seems good. . Alert and oriented to person and daughter. Normal mood.     Labs on Admission: I have personally reviewed following labs and imaging studies  CBC: Recent Labs  Lab 09/15/19 1610  WBC 5.7  NEUTROABS 2.8  HGB 11.2*  HCT 34.8*  MCV 97.8  PLT 99991111   Basic Metabolic Panel: Recent Labs  Lab 09/15/19 1610  NA 140  K 3.3*  CL 106  CO2 27  GLUCOSE 124*  BUN 19  CREATININE 0.67  CALCIUM 8.9   GFR: Estimated Creatinine Clearance: 35.3 mL/min (by C-G formula based on SCr of 0.67 mg/dL). Liver Function Tests: Recent Labs  Lab 09/15/19 1610  AST 33  ALT 19  ALKPHOS 117  BILITOT 0.8  PROT 6.9  ALBUMIN 3.5   Recent Labs  Lab 09/15/19 1610  LIPASE 33   No results for input(s): AMMONIA in the last 168 hours. Coagulation Profile: No results for input(s): INR, PROTIME in the last 168 hours. Cardiac Enzymes: No results for input(s): CKTOTAL, CKMB, CKMBINDEX, TROPONINI in the last 168 hours. BNP (last 3 results) No results for input(s): PROBNP in the last 8760 hours. HbA1C: No results for input(s): HGBA1C in the last 72 hours. CBG: No results for input(s): GLUCAP in the last 168 hours. Lipid Profile: No results for input(s): CHOL, HDL, LDLCALC, TRIG, CHOLHDL, LDLDIRECT in the last 72 hours. Thyroid Function Tests: No results for input(s): TSH, T4TOTAL, FREET4, T3FREE, THYROIDAB in the last 72 hours. Anemia Panel: No results for input(s): VITAMINB12, FOLATE, FERRITIN, TIBC, IRON, RETICCTPCT in the last 72 hours. Urine analysis:    Component Value Date/Time   COLORURINE YELLOW (A) 09/15/2019 1610   APPEARANCEUR CLOUDY (A) 09/15/2019 1610   APPEARANCEUR Hazy 07/13/2013 0212   LABSPEC 1.023 09/15/2019 1610   LABSPEC 1.015 07/13/2013 0212   PHURINE 5.0 09/15/2019 1610   GLUCOSEU NEGATIVE 09/15/2019 1610   GLUCOSEU 50 mg/dL 07/13/2013 0212   HGBUR NEGATIVE 09/15/2019 1610   BILIRUBINUR NEGATIVE  09/15/2019 1610   BILIRUBINUR Negative 07/13/2013 [place] 09/15/2019 1610   PROTEINUR  30 (A) 09/15/2019 1610   NITRITE NEGATIVE 09/15/2019 1610   LEUKOCYTESUR LARGE (A) 09/15/2019 1610   LEUKOCYTESUR Negative 07/13/2013 0212    Radiological Exams on Admission: CT Head Wo Contrast  Result Date: 09/15/2019 CLINICAL DATA:  Altered mental status. Malaise. EXAM: CT HEAD WITHOUT CONTRAST TECHNIQUE: Contiguous axial images were obtained from the base of the skull through the vertex without intravenous contrast. COMPARISON:  07/13/2013 FINDINGS: Brain: Mild-to-moderate enlargement of the ventricles and subarachnoid spaces. Mild-to-moderate patchy white matter low density in both cerebral hemispheres. No intracranial hemorrhage, mass lesion or CT evidence of acute infarction. Vascular: No hyperdense vessel or unexpected calcification. Skull: Normal. Negative for fracture or focal lesion. Sinuses/Orbits: Status post bilateral cataract extraction. Unremarkable bones and included paranasal sinuses. Other: None. IMPRESSION: 1. No acute abnormality. 2. Mild to moderate diffuse cerebral and cerebellar atrophy. 3. Mild to moderate chronic small vessel white matter ischemic changes in both cerebral hemispheres. Electronically Signed   By: Claudie Revering M.D.   On: 09/15/2019 17:16   DG Chest Port 1 View  Result Date: 09/15/2019 CLINICAL DATA:  Altered mental status. Weakness. Malaise. EXAM: PORTABLE CHEST 1 VIEW COMPARISON:  05/23/2019 FINDINGS: Stable enlarged cardiac silhouette and tortuous aorta. Stable hyperexpansion of the lungs with diffuse peribronchial thickening and accentuation of the interstitial markings. Interval small amount of linear density in the right mid lung zone. Diffuse osteopenia. IMPRESSION: 1. Interval small amount of linear atelectasis or scarring in the right mid lung zone. 2. Stable cardiomegaly and changes of COPD and chronic bronchitis. Electronically Signed   By: Claudie Revering M.D.   On: 09/15/2019 16:33    EKG: Independently reviewed. Sinus bradycardia, no acute changes - unchanged from older EKG  Assessment/Plan Active Problems:   Syncope, vasovagal   Acute lower UTI   HLD (hyperlipidemia)   BP (high blood pressure)   Syncope and collapse  (please populate well all problems here in Problem List. (For example, if patient is on BP meds at home and you resume or decide to hold them, it is a problem that needs to be her. Same for CAD, COPD, HLD and so on)   1. Syncope - suspect vasovagal in a patient with UTI and dehydration. No neurologic symptoms. Has recently seen neurology for imbalance: question of NPH vs other. Family does not wish major workup. Plan Med-surg for observation  AM orthostatic vitals  NS 250 cc bolus now  2. UTI - normal WBC on CBC. >50 WBC, few bacteria. Received 1g Rocephin in ED Plan cipro 250 mg bid starting in AM and continue for 5 days  Pyridium for dysuria  3. HTN - continue home meds  4. ACP - per daughter: DNR, DNI, no heroic measures. Recommended the patient and daughter complete MOST with PCP.    DVT prophylaxis: lovenox  Code Status: DNR  Family Communication: Daughter present and very helpful re: updating med list. Also will stay or arrange for family to stay with patient over night since she will get up at night.   Disposition Plan: Home 24 hrs Consults called: none  Admission status: observation    Adella Hare MD [facility] Pager [phone]  If 7PM-7AM, please contact night-coverage [email or web address] Password [facility]  09/15/2019, 7:42 PM

## 2019-09-15 NOTE — ED Triage Notes (Signed)
Per EMS pt had vaso vagal on toilet approximately @1530  per caregiver. Per caregiver pt is normally AOX4 and ambulatory but became confused and weak. Pt c/o abdominal pain, states she "feels sick all over."  Pt is now AOx4, NAD noted. Movement equal in all four extremities. PERRLA noted.

## 2019-09-15 NOTE — ED Notes (Signed)
Pt unable to understand function of pure wick catheter despite rigorous education. Pt assisted to bedside commode.

## 2019-09-16 ENCOUNTER — Encounter: Payer: Self-pay | Admitting: Internal Medicine

## 2019-09-16 DIAGNOSIS — I160 Hypertensive urgency: Secondary | ICD-10-CM | POA: Diagnosis not present

## 2019-09-16 DIAGNOSIS — N390 Urinary tract infection, site not specified: Secondary | ICD-10-CM | POA: Diagnosis not present

## 2019-09-16 DIAGNOSIS — R55 Syncope and collapse: Secondary | ICD-10-CM | POA: Diagnosis not present

## 2019-09-16 LAB — BASIC METABOLIC PANEL
Anion gap: 10 (ref 5–15)
BUN: 13 mg/dL (ref 8–23)
CO2: 25 mmol/L (ref 22–32)
Calcium: 8.9 mg/dL (ref 8.9–10.3)
Chloride: 106 mmol/L (ref 98–111)
Creatinine, Ser: 0.67 mg/dL (ref 0.44–1.00)
GFR calc Af Amer: >60 mL/min (ref >60)
GFR calc non Af Amer: >60 mL/min (ref >60)
Glucose, Bld: 104 mg/dL — ABNORMAL HIGH (ref 70–99)
Potassium: 3.4 mmol/L — ABNORMAL LOW (ref 3.5–5.1)
Sodium: 141 mmol/L (ref 135–145)

## 2019-09-16 LAB — CBC WITH DIFFERENTIAL/PLATELET
Abs Immature Granulocytes: 0.01 10*3/uL (ref 0.00–0.07)
Basophils Absolute: 0.1 10*3/uL (ref 0.0–0.1)
Basophils Relative: 1 %
Eosinophils Absolute: 0 10*3/uL (ref 0.0–0.5)
Eosinophils Relative: 1 %
HCT: 34.1 % — ABNORMAL LOW (ref 36.0–46.0)
Hemoglobin: 11.2 g/dL — ABNORMAL LOW (ref 12.0–15.0)
Immature Granulocytes: 0 %
Lymphocytes Relative: 16 %
Lymphs Abs: 0.8 10*3/uL (ref 0.7–4.0)
MCH: 32 pg (ref 26.0–34.0)
MCHC: 32.8 g/dL (ref 30.0–36.0)
MCV: 97.4 fL (ref 80.0–100.0)
Monocytes Absolute: 0.4 10*3/uL (ref 0.1–1.0)
Monocytes Relative: 8 %
Neutro Abs: 3.7 10*3/uL (ref 1.7–7.7)
Neutrophils Relative %: 74 %
Platelets: 192 10*3/uL (ref 150–400)
RBC: 3.5 MIL/uL — ABNORMAL LOW (ref 3.87–5.11)
RDW: 13.6 % (ref 11.5–15.5)
WBC: 5 10*3/uL (ref 4.0–10.5)
nRBC: 0 % (ref 0.0–0.2)

## 2019-09-16 LAB — SARS CORONAVIRUS 2 (TAT 6-24 HRS): SARS Coronavirus 2: NEGATIVE

## 2019-09-16 LAB — MAGNESIUM: Magnesium: 2.2 mg/dL (ref 1.7–2.4)

## 2019-09-16 MED ORDER — AMLODIPINE BESYLATE 5 MG PO TABS
5.0000 mg | ORAL_TABLET | Freq: Every day | ORAL | Status: DC
  Administered 2019-09-16 – 2019-09-17 (×2): 5 mg via ORAL
  Filled 2019-09-16 (×2): qty 1

## 2019-09-16 MED ORDER — OXYCODONE HCL 5 MG PO TABS
5.0000 mg | ORAL_TABLET | Freq: Three times a day (TID) | ORAL | Status: DC | PRN
  Administered 2019-09-16 – 2019-09-17 (×2): 5 mg via ORAL
  Filled 2019-09-16 (×2): qty 1

## 2019-09-16 MED ORDER — POLYETHYLENE GLYCOL 3350 17 G PO PACK: 17.0000 g | PACK | Freq: Every day | ORAL | Status: DC

## 2019-09-16 MED ORDER — CALCIUM CARBONATE ANTACID 500 MG PO CHEW
1.0000 | CHEWABLE_TABLET | Freq: Every day | ORAL | Status: DC
  Administered 2019-09-16 – 2019-09-17 (×2): 200 mg via ORAL
  Filled 2019-09-16 (×2): qty 1

## 2019-09-16 MED ORDER — OXYCODONE HCL 5 MG PO TABS
5.0000 mg | ORAL_TABLET | Freq: Two times a day (BID) | ORAL | Status: DC | PRN
  Administered 2019-09-16: 5 mg via ORAL
  Filled 2019-09-16 (×2): qty 1

## 2019-09-16 MED ORDER — HEPARIN SODIUM (PORCINE) 5000 UNIT/ML IJ SOLN
5000.0000 [IU] | Freq: Three times a day (TID) | INTRAMUSCULAR | Status: DC
  Administered 2019-09-16 – 2019-09-17 (×3): 5000 [IU] via SUBCUTANEOUS
  Filled 2019-09-16 (×3): qty 1

## 2019-09-16 NOTE — Evaluation (Signed)
Physical Therapy Evaluation Patient Details Name: Christy Waters MRN: [id number] DOB: 10-26-1918 Today's Date: 09/16/2019   History of Present Illness  presented to ER and admitted for work up after syncopal episode, likely related to UTI, dehydration.  Clinical Impression  Upon evaluation, patient alert and oriented to basic information; follows simple commands and agreeable to OOB activities.  Bilat UE/LE strength and ROM grossly symmetrical and WFL; no focal weakness appreciated.  Able to complete bed mobility with mod indep; sit/stand, basic transfers and gait (15') with RW, cga/min assist.  Demonstrates reciprocal stepping pattern with fair step height/length; slightly choppy with impaired balance reactions (increased sway in A/P plane) evident. Do recommend RW and +1 assist for optimal safety. Would benefit from skilled PT to address above deficits and promote optimal return to PLOF.; Recommend transition to HHPT upon discharge from acute hospitalization.     Follow Up Recommendations Home health PT    Equipment Recommendations  (has necessary equipment)    Recommendations for Other Services       Precautions / Restrictions Precautions Precautions: Fall Restrictions Weight Bearing Restrictions: No      Mobility  Bed Mobility Overal bed mobility: Needs Assistance Bed Mobility: Supine to Sit     Supine to sit: Supervision        Transfers Overall transfer level: Needs assistance Equipment used: Rolling walker (2 wheeled) Transfers: Sit to/from Stand Sit to Stand: Min guard;Min assist         General transfer comment: cuing for hand placement, assist for lift off and balance in A/P plane (assist to prevent posterior LOB)  Ambulation/Gait Ambulation/Gait assistance: Min guard;Min assist Gait Distance (Feet): 15 Feet Assistive device: Rolling walker (2 wheeled)       General Gait Details: reciprocal stepping pattern with fair step height/length;  slightly choppy with impaired balance reactions evident. Do recommend RW and +1 assist for optimal safety.  Stairs            Wheelchair Mobility    Modified Rankin (Stroke Patients Only)       Balance Overall balance assessment: Needs assistance Sitting-balance support: No upper extremity supported;Feet supported Sitting balance-Leahy Scale: Good     Standing balance support: Bilateral upper extremity supported Standing balance-Leahy Scale: Fair                               Pertinent Vitals/Pain Pain Assessment: No/denies pain    Home Living Family/patient expects to be discharged to:: Private residence Living Arrangements: Alone(caregiver, nearly 24/7) Available Help at Discharge: Family;Available PRN/intermittently(caregiver, nearly 24/7) Type of Home: House Home Access: Stairs to enter Entrance Stairs-Rails: Left Entrance Stairs-Number of Steps: 3 Home Layout: One level Home Equipment: Walker - 4 wheels      Prior Function Level of Independence: Independent with assistive device(s)         Comments: Mod indep wiht 4WRW for ADLs, household mobilization; caregiver assists with ADLs, household responsibilities as needed.  Does endorse at least single fall within previous six months, as recently as 3 weeks prior to admission     Hand Dominance        Extremity/Trunk Assessment   Upper Extremity Assessment Upper Extremity Assessment: Overall WFL for tasks assessed(grossly at least 4/5 throughout)    Lower Extremity Assessment Lower Extremity Assessment: Overall WFL for tasks assessed(grossly at least 4/5 throughout)       Communication   Communication: No difficulties  Cognition Arousal/Alertness: Awake/alert  Behavior During Therapy: WFL for tasks assessed/performed Overall Cognitive Status: Within Functional Limits for tasks assessed                                        General Comments      Exercises Other  Exercises Other Exercises: Toilet transfer, SPT without assist device, min assist; sit/stand from [facility] with RW, cga/min assist; standing balance at sink for hand hygiene, cga/min assist.  Does require UE support for external stabilization and functional reach.  Did encourage OOB to chair as tolerated progressive mobilization in room and around unit as appropriate with RW and support from staff as appropriate; patient/daughter voiced understanding and agreement.   Assessment/Plan    PT Assessment Patient needs continued PT services  PT Problem List Decreased activity tolerance;Decreased balance;Decreased mobility;Decreased safety awareness       PT Treatment Interventions DME instruction;Gait training;Stair training;Functional mobility training;Therapeutic activities;Therapeutic exercise;Balance training;Patient/family education    PT Goals (Current goals can be found in the Care Plan section)  Acute Rehab PT Goals Patient Stated Goal: to move around a little PT Goal Formulation: With patient Time For Goal Achievement: 09/30/19 Potential to Achieve Goals: Good    Frequency Min 2X/week   Barriers to discharge        Co-evaluation               AM-PAC PT "6 Clicks" Mobility  Outcome Measure Help needed turning from your back to your side while in a flat bed without using bedrails?: None Help needed moving from lying on your back to sitting on the side of a flat bed without using bedrails?: None Help needed moving to and from a bed to a chair (including a wheelchair)?: A Little Help needed standing up from a chair using your arms (e.g., wheelchair or bedside chair)?: A Little Help needed to walk in hospital room?: A Little Help needed climbing 3-5 steps with a railing? : A Little 6 Click Score: 20    End of Session Equipment Utilized During Treatment: Gait belt Activity Tolerance: Patient tolerated treatment well Patient left: in chair;with call bell/phone within reach;with  chair alarm set Nurse Communication: Mobility status PT Visit Diagnosis: Muscle weakness (generalized) (M62.81);History of falling (Z91.81);Difficulty in walking, not elsewhere classified (R26.2)    Time: [id number] PT Time Calculation (min) (ACUTE ONLY): 16 min   Charges:   PT Evaluation $PT Eval Moderate Complexity: 1 Mod PT Treatments $Therapeutic Activity: 8-22 mins        Averiana Clouatre H. Owens Shark, PT, DPT, NCS 09/16/19, 9:33 AM [phone]

## 2019-09-16 NOTE — Progress Notes (Signed)
Alerted Dr. Mal Misty that patient's BP is still elevated at 190/88 after AM medications. MD stated he would order amlodipine. Will give. Patient is asymptomatic. Will continue to monitor. All safety measures are in place, no distress noted.

## 2019-09-16 NOTE — TOC Initial Note (Signed)
Transition of Care [facility]) - Initial/Assessment Note    Patient Details  Name: Christy Waters MRN: [id number] Date of Birth: 04-Oct-1918  Transition of Care [facility]) CM/SW Contact:    Shelbie Hutching, RN Phone Number: 09/16/2019, 4:03 PM  Clinical Narrative:                 Patient placed under observation for syncopal episode and hypertension.  Patient is from home where she reports her son lives with her.  Patient states she "wants to go home this afternoon but she let her people go so she is stuck here".  Patient is pleasant and is aware that she may be medically cleared and ready to go home tomorrow.  Patient is agreeable to home health services and has no preference in agency.   RNCM will cont to follow and assist with discharge.   Expected Discharge Plan: [place] Barriers to Discharge: Continued Medical Work up   Patient Goals and CMS Choice Patient states their goals for this hospitalization and ongoing recovery are:: wants to go home this afternoon CMS Medicare.gov Compare Post Acute Care list provided to:: Patient Choice offered to / list presented to : Patient  Expected Discharge Plan and Services Expected Discharge Plan: [place] Choice: [place] arrangements for the past 2 months: Single Family Home                                      Prior Living Arrangements/Services Living arrangements for the past 2 months: Single Family Home Lives with:: Adult Children(son) Patient language and need for interpreter reviewed:: Yes        Need for Family Participation in Patient Care: Yes (Comment)(hypertension, sycople episodes) Care giver support system in place?: Yes (comment)(daughter and son)   Criminal Activity/Legal Involvement Pertinent to Current Situation/Hospitalization: No - Comment as needed  Activities of Daily Living Home Assistive Devices/Equipment: Eyeglasses, Environmental consultant (specify  type), Dentures (specify type) ADL Screening (condition at time of admission) Patient's cognitive ability adequate to safely complete daily activities?: Yes Is the patient deaf or have difficulty hearing?: No Does the patient have difficulty seeing, even when wearing glasses/contacts?: No Does the patient have difficulty concentrating, remembering, or making decisions?: No Patient able to express need for assistance with ADLs?: Yes Does the patient have difficulty dressing or bathing?: Yes Independently performs ADLs?: No Communication: Independent Dressing (OT): Needs assistance Is this a change from baseline?: Pre-admission baseline Grooming: Needs assistance Is this a change from baseline?: Pre-admission baseline Feeding: Independent Bathing: Needs assistance Is this a change from baseline?: Pre-admission baseline Toileting: Needs assistance Is this a change from baseline?: Pre-admission baseline In/Out Bed: Needs assistance Is this a change from baseline?: Pre-admission baseline Walks in Home: Needs assistance(w/Walker) Does the patient have difficulty walking or climbing stairs?: Yes Weakness of Legs: Both Weakness of Arms/Hands: None  Permission Sought/Granted                  Emotional Assessment Appearance:: Appears stated age Attitude/Demeanor/Rapport: Engaged Affect (typically observed): Accepting Orientation: : Oriented to Self, Oriented to Place, Oriented to  Time, Oriented to Situation Alcohol / Substance Use: Not Applicable Psych Involvement: No (comment)  Admission diagnosis:  Syncope and collapse [R55] Lower urinary tract infectious disease [N39.0] Patient Active Problem List   Diagnosis Date Noted  . Hypertensive  urgency 09/16/2019  . Syncope, vasovagal 09/15/2019  . Syncope and collapse 09/15/2019  . Acute lower UTI 09/15/2019  . Pelvic pain in female 03/20/2018  . Cor pulmonale ([place]) 09/19/2017  . Low serum vitamin D 06/19/2017  . History of  atrial fibrillation 06/19/2017  . Chest pain 05/10/2017  . Moderate episode of recurrent major depressive disorder ([place]) 04/04/2017  . Adult idiopathic generalized osteoporosis 04/13/2016  . 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 Irritable bowel syndrome with diarrhea 05/20/2014  . Abdominal pain, generalized 04/01/2014  . Atherosclerosis of aorta ([place]) 03/05/2014  . 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 Idiopathic peripheral neuropathy 03/05/2014  . Abdominal pain of unknown cause 02/06/2014  . MI (mitral incompetence) 02/06/2014  . Stress incontinence 02/06/2014  . Fibrosis of lung ([place]) 10/03/2013  . Generalized OA 10/03/2013  . HLD (hyperlipidemia) 10/03/2013  . BP (high blood pressure) 10/03/2013  . OP (osteoporosis) 10/03/2013  . Beat, premature ventricular 10/03/2013  . Ventricular premature depolarization 10/03/2013   PCP:  Rusty Aus, MD Pharmacy:   [facility] [address] [place] [address] Phone: [phone] Fax: [place], [place] [address] [address] Phone: [phone] Fax: [place], [place] [address] Phone: [phone] Fax: [phone]  [id number] [place], [place] [address] Phone: [phone] Fax: [phone]     Social Determinants of Health (SDOH) Interventions    Readmission Risk Interventions No flowsheet data found.

## 2019-09-16 NOTE — Progress Notes (Signed)
Alerted Dr. Mal Misty that patient's BP was 190/94 HR of 58 this AM, rechecked at 176/92 and HR is 57. MD ordered to give AM Irbesartan and obtain orthostatic. No new orders. Will continue to monitor.

## 2019-09-16 NOTE — Progress Notes (Signed)
PT Cancellation Note  Patient Details Name: Christy Waters MRN: [id number] DOB: [birth year]   Cancelled Treatment:    Reason Eval/Treat Not Completed: (Consult received and chart reviewed.  Patient just received breakfast tray and starting to eat.  Will re-attempt at later time/date as medically appropriate and available.)   [name], PT, DPT, NCS 09/16/19, 8:58 AM [phone]

## 2019-09-16 NOTE — Progress Notes (Signed)
Alerted Dr. Mal Misty that patient's BP is 180/90. MD acknowledged order. No new orders. Will continue to monitor.

## 2019-09-16 NOTE — Care Management Obs Status (Signed)
[place] NOTIFICATION   Patient Details  Name: Fairie Birdsong MRN: [id number] Date of Birth: 24-Jul-1918   Medicare Observation Status Notification Given:  Yes    Shelbie Hutching, RN 09/16/2019, 3:55 PM

## 2019-09-16 NOTE — Progress Notes (Addendum)
Alerted Dr. Mal Misty that patient is complaining of abdominal pain. Per patient, she typically has abdominal pain at home. Bentyl given this AM which was ineffective. Gave tylenol and tums as well. These are ineffective as well. Patient states that pain is more severe than normal. Patient did have a BM this AM. It is worse after eating. MD notified of all of this. Ordered oxycodone- will try and see if this is effective. Will continue to monitor.

## 2019-09-16 NOTE — Progress Notes (Addendum)
Progress Note    Christy Waters  [id number] DOB: 13-Dec-1918  DOA: 09/15/2019 PCP: Rusty Aus, MD      Brief Narrative:    Medical records reviewed and are as summarized below:  Christy Waters is an 86 y.o. female with medical history significant for hypertension, history of atrial fibrillation (no longer on Eliquis), hyperlipidemia, who was brought to the hospital after syncopal episode at home.  Reportedly, she was sitting on the toilet and was ready to have a bowel movement.  Caregiver had a loud noise in the bathroom and she went over and saw her slumped over.  She tried to sit up but unfortunately she passed out.      Assessment/Plan:   Principal Problem:   Syncope and collapse Active Problems:   HLD (hyperlipidemia)   BP (high blood pressure)   Syncope, vasovagal   Acute lower UTI   Hypertensive urgency   Status post syncope: Probably vasovagal (defecation syncope).  PT was ordered and home health PT was recommended.  No orthostatic hypotension.  Obtain 2D echo for further evaluation.  Acute UTI: Continue ciprofloxacin.  Pyridium for dysuria.  Follow-up urine cultures.  Hypertensive urgency: Continue irbesartan.  Add amlodipine for adequate BP control.   Paroxysmal atrial fibrillation: No longer on anticoagulation.  She used to take Eliquis but it was stopped a long time ago according to her daughter.   Body mass index is 21.13 kg/m.   Family Communication/Anticipated D/C date and plan/Code Status   DVT prophylaxis: Heparin Code Status: Full code Family Communication: Plan discussed with the daughter at the bedside Disposition Plan:    Status is: Observation  The patient remains OBS appropriate and will d/c before 2 midnights.  Dispo: The patient is from: Home              Anticipated d/c is to: Home              Anticipated d/c date is: 1 day              Patient currently is not medically stable to  d/c.           Subjective:   She complains of dysuria, increased frequency of micturition, generalized weakness and dizziness  Objective:    Vitals:   09/16/19 0727 09/16/19 0750 09/16/19 1043 09/16/19 1212  BP: (!) 190/94 (!) 176/92 (!) 190/88 (!) 156/86  Pulse: (!) 58 (!) 57 69 (!) 59  Resp: 14 14    Temp: 98.1 F (36.7 C)     TempSrc: Oral     SpO2: 97% 97%    Weight:      Height:       Orthostatic VS for the past 24 hrs:  BP- Lying Pulse- Lying BP- Sitting Pulse- Sitting BP- Standing at 0 minutes Pulse- Standing at 0 minutes  09/16/19 0833 (!) 164/93 59 (!) 173/96 61 (!) 166/102 68     Intake/Output Summary (Last 24 hours) at 09/16/2019 1214 Last data filed at 09/16/2019 0500 Gross per 24 hour  Intake 424.16 ml  Output --  Net 424.16 ml   Filed Weights   09/15/19 1606  Weight: 57.6 kg    Exam:  GEN: NAD SKIN: No rash EYES: EOMI ENT: MMM CV: RRR PULM: CTA B ABD: soft, ND, NT, +BS CNS: AAO x 3, non focal EXT: No edema or tenderness   Data Reviewed:   I have personally reviewed following labs and imaging studies:  Labs: Labs show the following:   Basic Metabolic Panel: Recent Labs  Lab 09/15/19 1610  NA 140  K 3.3*  CL 106  CO2 27  GLUCOSE 124*  BUN 19  CREATININE 0.67  CALCIUM 8.9   GFR Estimated Creatinine Clearance: 35.3 mL/min (by C-G formula based on SCr of 0.67 mg/dL). Liver Function Tests: Recent Labs  Lab 09/15/19 1610  AST 33  ALT 19  ALKPHOS 117  BILITOT 0.8  PROT 6.9  ALBUMIN 3.5   Recent Labs  Lab 09/15/19 1610  LIPASE 33   No results for input(s): AMMONIA in the last 168 hours. Coagulation profile No results for input(s): INR, PROTIME in the last 168 hours.  CBC: Recent Labs  Lab 09/15/19 1610  WBC 5.7  NEUTROABS 2.8  HGB 11.2*  HCT 34.8*  MCV 97.8  PLT 206   Cardiac Enzymes: No results for input(s): CKTOTAL, CKMB, CKMBINDEX, TROPONINI in the last 168 hours. BNP (last 3 results) No results  for input(s): PROBNP in the last 8760 hours. CBG: No results for input(s): GLUCAP in the last 168 hours. D-Dimer: No results for input(s): DDIMER in the last 72 hours. Hgb A1c: No results for input(s): HGBA1C in the last 72 hours. Lipid Profile: No results for input(s): CHOL, HDL, LDLCALC, TRIG, CHOLHDL, LDLDIRECT in the last 72 hours. Thyroid function studies: No results for input(s): TSH, T4TOTAL, T3FREE, THYROIDAB in the last 72 hours.  Invalid input(s): FREET3 Anemia work up: No results for input(s): VITAMINB12, FOLATE, FERRITIN, TIBC, IRON, RETICCTPCT in the last 72 hours. Sepsis Labs: Recent Labs  Lab 09/15/19 1610  WBC 5.7    Microbiology Recent Results (from the past 240 hour(s))  SARS CORONAVIRUS 2 (TAT 6-24 HRS) Nasopharyngeal Nasopharyngeal Swab     Status: None   Collection Time: 09/15/19  4:11 PM   Specimen: Nasopharyngeal Swab  Result Value Ref Range Status   SARS Coronavirus 2 NEGATIVE NEGATIVE Final    Comment: (NOTE) SARS-CoV-2 target nucleic acids are NOT DETECTED. The SARS-CoV-2 RNA is generally detectable in upper and lower respiratory specimens during the acute phase of infection. Negative results do not preclude SARS-CoV-2 infection, do not rule out co-infections with other pathogens, and should not be used as the sole basis for treatment or other patient management decisions. Negative results must be combined with clinical observations, patient history, and epidemiological information. The expected result is Negative. Fact Sheet for Patients: SugarRoll.be Fact Sheet for Healthcare Providers: https://www.woods-mathews.com/ This test is not yet approved or cleared by the [place] FDA and  has been authorized for detection and/or diagnosis of SARS-CoV-2 by FDA under an Emergency Use Authorization (EUA). This EUA will remain  in effect (meaning this test can be used) for the duration of the COVID-19  declaration under Section 56 4(b)(1) of the Act, 21 U.S.C. section 360bbb-3(b)(1), unless the authorization is terminated or revoked sooner. Performed at [facility], [place] [address] [place], [place] [address]     Procedures and diagnostic studies:  CT Head Wo Contrast  Result Date: 09/15/2019 CLINICAL DATA:  Altered mental status. Malaise. EXAM: CT HEAD WITHOUT CONTRAST TECHNIQUE: Contiguous axial images were obtained from the base of the skull through the vertex without intravenous contrast. COMPARISON:  07/13/2013 FINDINGS: Brain: Mild-to-moderate enlargement of the ventricles and subarachnoid spaces. Mild-to-moderate patchy white matter low density in both cerebral hemispheres. No intracranial hemorrhage, mass lesion or CT evidence of acute infarction. Vascular: No hyperdense vessel or unexpected calcification. Skull: Normal. Negative for fracture  or focal lesion. Sinuses/Orbits: Status post bilateral cataract extraction. Unremarkable bones and included paranasal sinuses. Other: None. IMPRESSION: 1. No acute abnormality. 2. Mild to moderate diffuse cerebral and cerebellar atrophy. 3. Mild to moderate chronic small vessel white matter ischemic changes in both cerebral hemispheres. Electronically Signed   By: Claudie Revering M.D.   On: 09/15/2019 17:16   DG Chest Port 1 View  Result Date: 09/15/2019 CLINICAL DATA:  Altered mental status. Weakness. Malaise. EXAM: PORTABLE CHEST 1 VIEW COMPARISON:  05/23/2019 FINDINGS: Stable enlarged cardiac silhouette and tortuous aorta. Stable hyperexpansion of the lungs with diffuse peribronchial thickening and accentuation of the interstitial markings. Interval small amount of linear density in the right mid lung zone. Diffuse osteopenia. IMPRESSION: 1. Interval small amount of linear atelectasis or scarring in the right mid lung zone. 2. Stable cardiomegaly and changes of COPD and chronic bronchitis. Electronically Signed   By: Claudie Revering M.D.   On:  09/15/2019 16:33    Medications:   . amLODipine  5 mg Oral Daily  . ciprofloxacin  250 mg Oral BID  . feeding supplement (ENSURE ENLIVE)  237 mL Oral BID BM  . heparin injection (subcutaneous)  5,000 Units Subcutaneous Q8H  . irbesartan  75 mg Oral Daily  . PARoxetine  20 mg Oral Daily  . phenazopyridine  100 mg Oral BID  . polyethylene glycol  17 g Oral Daily  . potassium chloride  10 mEq Oral Daily  . sodium chloride flush  3 mL Intravenous Q12H   Continuous Infusions: . sodium chloride       LOS: 0 days   Bryn Perkin  [facility]     09/16/2019, 12:14 PM

## 2019-09-17 ENCOUNTER — Observation Stay
Admit: 2019-09-17 | Discharge: 2019-09-17 | Disposition: A | Discharge status: Home / Self Care | Payer: Medicare Other | Attending: Internal Medicine | Admitting: Internal Medicine

## 2019-09-17 DIAGNOSIS — I160 Hypertensive urgency: Secondary | ICD-10-CM | POA: Diagnosis not present

## 2019-09-17 DIAGNOSIS — R55 Syncope and collapse: Secondary | ICD-10-CM | POA: Diagnosis not present

## 2019-09-17 DIAGNOSIS — N390 Urinary tract infection, site not specified: Secondary | ICD-10-CM | POA: Diagnosis not present

## 2019-09-17 LAB — ECHOCARDIOGRAM COMPLETE
Height: 65 in
Weight: 2032 oz

## 2019-09-17 MED ORDER — AMLODIPINE BESYLATE 2.5 MG PO TABS: 2.5000 mg | ORAL_TABLET | Freq: Every day | ORAL | 0 refills | Status: DC

## 2019-09-17 MED ORDER — SULFAMETHOXAZOLE-TRIMETHOPRIM 800-160 MG PO TABS
1.0000 | ORAL_TABLET | Freq: Two times a day (BID) | ORAL | Status: DC
  Filled 2019-09-17: qty 1

## 2019-09-17 MED ORDER — POTASSIUM CHLORIDE CRYS ER 20 MEQ PO TBCR
30.0000 meq | EXTENDED_RELEASE_TABLET | Freq: Once | ORAL | Status: AC
  Administered 2019-09-17: 30 meq via ORAL
  Filled 2019-09-17: qty 1

## 2019-09-17 MED ORDER — SULFAMETHOXAZOLE-TRIMETHOPRIM 800-160 MG PO TABS: 1.0000 | ORAL_TABLET | Freq: Two times a day (BID) | ORAL | 0 refills | Status: DC

## 2019-09-17 MED ORDER — ALPRAZOLAM 0.25 MG PO TABS
0.2500 mg | ORAL_TABLET | Freq: Two times a day (BID) | ORAL | Status: DC | PRN
  Administered 2019-09-17: 0.25 mg via ORAL
  Filled 2019-09-17: qty 1

## 2019-09-17 NOTE — TOC Transition Note (Signed)
Transition of Care [facility]) - CM/SW Discharge Note   Patient Details  Name: Christy Waters MRN: [id number] Date of Birth: 1918/07/20  [facility]) CM/SW Contact:  Shelbie Hutching, RN Phone Number: 09/17/2019, 12:16 PM   Clinical Narrative:    Patient has been cleared for discharge home.  Home Health services for PT have been arranged with [facility], Sh[facility]on Revere accepted home health referral.  Patient's daughter will provide transportation home.  Daughter reports that patient has private paid caregivers that come in everyday and she and her sister are also caregivers to the patient.  Patient has all needed equipment already at home.     Final next level of care: Home/Self Care Barriers to Discharge: Barriers Resolved   Patient Goals and CMS Choice Patient states their goals for this hospitalization and ongoing recovery are:: wants to go home this afternoon CMS Medicare.gov Compare Post Acute Care list provided to:: Patient Choice offered to / list presented to : Patient  Discharge Placement                       Discharge Plan and Services     Post Acute Care Choice: Home Health                    HH Arranged: PT [facility] Agency: [place] Date [facility] Agency Contacted: 09/17/19 Time HH Agency Contacted: 8 Representative spoke with at [place]: Malachy Mood rose  Social Determinants of Health ([place]) Interventions     Readmission Risk Interventions No flowsheet data found.

## 2019-09-17 NOTE — Progress Notes (Signed)
PT Cancellation Note  Patient Details Name: Christy Waters MRN: [id number] DOB: Jan 19, 1919   Cancelled Treatment:     PT attempt. Pt currently have ECHO. Therapist will return later this date prior to pt DC to home.   Willette Pa 09/17/2019, 1:31 PM

## 2019-09-17 NOTE — Discharge Summary (Signed)
Physician Discharge Summary  Christy Waters [id number] DOB: 27-Sep-1918 DOA: 09/15/2019  PCP: Rusty Aus, MD  Admit date: 09/15/2019 Discharge date: 09/17/2019  Discharge disposition: Home with home health therapy   Recommendations for Outpatient Follow-Up:   Outpatient follow-up with PCP in 1 week   Discharge Diagnosis:   Principal Problem:   Syncope and collapse Active Problems:   HLD (hyperlipidemia)   BP (high blood pressure)   Syncope, vasovagal   Acute lower UTI   Hypertensive urgency    Discharge Condition: Stable.  Diet recommendation: Heart healthy diet  Code status: Full code    Hospital Course:   Christy Waters is an 86 y.o. female with medical history significant for hypertension, history of atrial fibrillation (no longer on Eliquis), hyperlipidemia, who was brought to the hospital after syncopal episode at home.  Reportedly, she was sitting on the toilet and was ready to have a bowel movement.  Caregiver had a loud noise in the bathroom and she went over and saw her slumped over.  She tried to sit up but unfortunately she passed out.  She was admitted to the hospital for observation.  CT head did not show any acute abnormality.  2D echo showed EF estimated at 60 to 65% and grade 1 diastolic dysfunction (see details below).  Syncope was presumed to be vasovagal from defecation.  She had hypertensive urgency on admission.  She was on Benicar for hypertension prior to admission.  Amlodipine was added for adequate BP control.  She was also found to have acute lower urinary tract infection.  She was treated with empiric antibiotics.  Urine culture showed pansensitive Staph aureus.  She was discharged on Bactrim.  She was evaluated by PT recommended home health therapy.     Discharge Exam:   Vitals:   09/17/19 0752 09/17/19 1205  BP: (!) 167/75 (!) 142/76  Pulse: 62 63  Resp: 18   Temp: 98.2 F (36.8 C)   SpO2:  96%   Vitals:    09/16/19 1736 09/16/19 2331 09/17/19 0752 09/17/19 1205  BP: (!) 163/80 (!) 142/65 (!) 167/75 (!) 142/76  Pulse: 65 65 62 63  Resp:  18 18   Temp:  98.2 F (36.8 C) 98.2 F (36.8 C)   TempSrc:  Oral Oral   SpO2:  96%  96%  Weight:      Height:         GEN: NAD SKIN: No rash EYES: EOMI ENT: MMM CV: RRR PULM: CTA B ABD: soft, ND, NT, +BS CNS: AAO x 3, non focal EXT: No edema or tenderness   The results of significant diagnostics from this hospitalization (including imaging, microbiology, ancillary and laboratory) are listed below for reference.     Procedures and Diagnostic Studies:   CT Head Wo Contrast  Result Date: 09/15/2019 CLINICAL DATA:  Altered mental status. Malaise. EXAM: CT HEAD WITHOUT CONTRAST TECHNIQUE: Contiguous axial images were obtained from the base of the skull through the vertex without intravenous contrast. COMPARISON:  07/13/2013 FINDINGS: Brain: Mild-to-moderate enlargement of the ventricles and subarachnoid spaces. Mild-to-moderate patchy white matter low density in both cerebral hemispheres. No intracranial hemorrhage, mass lesion or CT evidence of acute infarction. Vascular: No hyperdense vessel or unexpected calcification. Skull: Normal. Negative for fracture or focal lesion. Sinuses/Orbits: Status post bilateral cataract extraction. Unremarkable bones and included paranasal sinuses. Other: None. IMPRESSION: 1. No acute abnormality. 2. Mild to moderate diffuse cerebral and cerebellar atrophy. 3. Mild to moderate chronic small  vessel white matter ischemic changes in both cerebral hemispheres. Electronically Signed   By: Claudie Revering M.D.   On: 09/15/2019 17:16   DG Chest Port 1 View  Result Date: 09/15/2019 CLINICAL DATA:  Altered mental status. Weakness. Malaise. EXAM: PORTABLE CHEST 1 VIEW COMPARISON:  05/23/2019 FINDINGS: Stable enlarged cardiac silhouette and tortuous aorta. Stable hyperexpansion of the lungs with diffuse peribronchial thickening  and accentuation of the interstitial markings. Interval small amount of linear density in the right mid lung zone. Diffuse osteopenia. IMPRESSION: 1. Interval small amount of linear atelectasis or scarring in the right mid lung zone. 2. Stable cardiomegaly and changes of COPD and chronic bronchitis. Electronically Signed   By: Claudie Revering M.D.   On: 09/15/2019 16:33     Labs:   Basic Metabolic Panel: Recent Labs  Lab 09/15/19 1610 09/16/19 1218  NA 140 141  K 3.3* 3.4*  CL 106 106  CO2 27 25  GLUCOSE 124* 104*  BUN 19 13  CREATININE 0.67 0.67  CALCIUM 8.9 8.9  MG  --  2.2   GFR Estimated Creatinine Clearance: 35.3 mL/min (by C-G formula based on SCr of 0.67 mg/dL). Liver Function Tests: Recent Labs  Lab 09/15/19 1610  AST 33  ALT 19  ALKPHOS 117  BILITOT 0.8  PROT 6.9  ALBUMIN 3.5   Recent Labs  Lab 09/15/19 1610  LIPASE 33   No results for input(s): AMMONIA in the last 168 hours. Coagulation profile No results for input(s): INR, PROTIME in the last 168 hours.  CBC: Recent Labs  Lab 09/15/19 1610 09/16/19 1218  WBC 5.7 5.0  NEUTROABS 2.8 3.7  HGB 11.2* 11.2*  HCT 34.8* 34.1*  MCV 97.8 97.4  PLT 206 192   Cardiac Enzymes: No results for input(s): CKTOTAL, CKMB, CKMBINDEX, TROPONINI in the last 168 hours. BNP: Invalid input(s): POCBNP CBG: No results for input(s): GLUCAP in the last 168 hours. D-Dimer No results for input(s): DDIMER in the last 72 hours. Hgb A1c No results for input(s): HGBA1C in the last 72 hours. Lipid Profile No results for input(s): CHOL, HDL, LDLCALC, TRIG, CHOLHDL, LDLDIRECT in the last 72 hours. Thyroid function studies No results for input(s): TSH, T4TOTAL, T3FREE, THYROIDAB in the last 72 hours.  Invalid input(s): FREET3 Anemia work up No results for input(s): VITAMINB12, FOLATE, FERRITIN, TIBC, IRON, RETICCTPCT in the last 72 hours. Microbiology Recent Results (from the past 240 hour(s))  Urine Culture     Status:  Abnormal (Preliminary result)   Collection Time: 09/15/19  4:10 PM   Specimen: Urine, Random  Result Value Ref Range Status   Specimen Description   Final    URINE, RANDOM Performed at [facility], [address] [place], [place] [address]    Special Requests   Final    NONE Performed at [facility], [address], [place], [place] [address]    Culture (A)  Final    >=100,000 COLONIES/mL STAPHYLOCOCCUS AUREUS SUSCEPTIBILITIES TO FOLLOW Performed at [facility], [place] [address] [place], [place] [address]    Report Status PENDING  Incomplete  SARS CORONAVIRUS 2 (TAT 6-24 HRS) Nasopharyngeal Nasopharyngeal Swab     Status: None   Collection Time: 09/15/19  4:11 PM   Specimen: Nasopharyngeal Swab  Result Value Ref Range Status   SARS Coronavirus 2 NEGATIVE NEGATIVE Final    Comment: (NOTE) SARS-CoV-2 target nucleic acids are NOT DETECTED. The SARS-CoV-2 RNA is generally detectable in upper and lower respiratory specimens  during the acute phase of infection. Negative results do not preclude SARS-CoV-2 infection, do not rule out co-infections with other pathogens, and should not be used as the sole basis for treatment or other patient management decisions. Negative results must be combined with clinical observations, patient history, and epidemiological information. The expected result is Negative. Fact Sheet for Patients: SugarRoll.be Fact Sheet for Healthcare Providers: https://www.woods-mathews.com/ This test is not yet approved or cleared by the [place] FDA and  has been authorized for detection and/or diagnosis of SARS-CoV-2 by FDA under an Emergency Use Authorization (EUA). This EUA will remain  in effect (meaning this test can be used) for the duration of the COVID-19 declaration under Section 56 4(b)(1) of the Act, 21 U.S.C. section 360bbb-3(b)(1), unless the authorization is terminated  or revoked sooner. Performed at [facility], [place] [address] [place], [place] [address]      Discharge Instructions:   Discharge Instructions    Diet - low sodium heart healthy   Complete by: As directed    Face-to-face encounter (required for Medicare/Medicaid patients)   Complete by: As directed    I Mehar Kirkwood certify that this patient is under my care and that I, or a nurse practitioner or physician's assistant working with me, had a face-to-face encounter that meets the physician face-to-face encounter requirements with this patient on 09/17/2019. The encounter with the patient was in whole, or in part for the following medical condition(s) which is the primary reason for home health care (List medical condition): unsteady gait   The encounter with the patient was in whole, or in part, for the following medical condition, which is the primary reason for home health care: unsteady gait   I certify that, based on my findings, the following services are medically necessary home health services: Physical therapy   Reason for Medically Necessary Home Health Services: Therapy- Personnel officer, Public librarian   My clinical findings support the need for the above services: Unable to leave home safely without assistance and/or assistive device   Further, I certify that my clinical findings support that this patient is homebound due to: Unable to leave home safely without assistance   Home Health   Complete by: As directed    To provide the following care/treatments: PT   Increase activity slowly   Complete by: As directed      Allergies as of 09/17/2019      Reactions   Azithromycin Shortness Of Breath, Other (See Comments)   Syncope Can not stand up, feels very sick, weak Syncope Syncope Can not stand up, feels very sick, weak Syncope Syncope Can not stand up, feels very sick, weak Syncope Can not stand up, feels very sick, weak Syncope   Clindamycin  Other (See Comments)   GI problem  GI problem  GI problem    Clindamycin/lincomycin    Hyoscyamine Diarrhea   Lincomycin Other (See Comments)   Maxitrol [neomycin-polymyxin-dexameth]    Eye irritation   Other Other (See Comments)   Eye irritation Eye irritation Eye irritation   Timolol Other (See Comments)   Eye irritation Eye irritation Eye irritation Eye irritation   Codeine Other (See Comments), Nausea Only   Altered mental status FATIGUE, SICK FEELING Altered mental status Altered mental status FATIGUE, SICK FEELING   Penicillins Rash   Has patient had a PCN reaction causing immediate rash, facial/tongue/throat swelling, SOB or lightheadedness with hypotension: Yes Has patient had a PCN reaction causing severe rash  involving mucus membranes or skin necrosis: No Has patient had a PCN reaction that required hospitalization: No Has patient had a PCN reaction occurring within the last 10 years: No If all of the above answers are "NO", then may proceed with Cephalosporin use.      Medication List    TAKE these medications   acetaminophen 325 MG tablet Commonly known as: TYLENOL Take 2 tablets (650 mg total) by mouth every 6 (six) hours as needed.   Align 4 MG Caps Take 1 capsule by mouth daily.   ALPRAZolam 0.25 MG tablet Commonly known as: XANAX Take 0.25 mg by mouth 2 (two) times daily with a meal.   amLODipine 2.5 MG tablet Commonly known as: NORVASC Take 1 tablet (2.5 mg total) by mouth daily. Start taking on: September 18, 2019   dicyclomine 20 MG tablet Commonly known as: BENTYL Take 20 mg by mouth 4 (four) times daily as needed for spasms.   feeding supplement (ENSURE ENLIVE) Liqd Take 237 mLs by mouth 2 (two) times daily between meals.   Lumigan 0.01 % Soln Generic drug: bimatoprost Place 1 drop into both eyes at bedtime.   olmesartan 20 MG tablet Commonly known as: BENICAR Take 20 mg by mouth daily.   PARoxetine 20 MG tablet Commonly known as:  PAXIL Take 20 mg by mouth daily.   potassium chloride 10 MEQ tablet Commonly known as: KLOR-CON Take 10 mEq by mouth daily.   sulfamethoxazole-trimethoprim 800-160 MG tablet Commonly known as: BACTRIM DS Take 1 tablet by mouth every 12 (twelve) hours.         Time coordinating discharge: 32 minutes  Signed:  Tarren Sabree  [facility] 09/17/2019, 12:17 PM

## 2019-09-17 NOTE — Progress Notes (Signed)
*  PRELIMINARY RESULTS* Echocardiogram 2D Echocardiogram has been performed.  Sherrie Sport 09/17/2019, 2:04 PM

## 2019-09-17 NOTE — Progress Notes (Signed)
Discharge instructions provided to patient and daughter. All questions answered. Breathing is even and unlabored, vitals are stable. Iv removed. All belongings gathered to be taken with patient. Daughter states they would make patient's follow up appointment as she can not go just any time, but would make it within one week. No distress noted. Awaiting transportation at 3pm (when second daughter will be back with car).

## 2019-09-18 LAB — URINE CULTURE: Culture: >=100000 — AB

## 2019-10-28 ENCOUNTER — Emergency Department (HOSPITAL_COMMUNITY): Payer: Medicare Other

## 2019-10-28 ENCOUNTER — Other Ambulatory Visit: Payer: Self-pay

## 2019-10-28 ENCOUNTER — Inpatient Hospital Stay (HOSPITAL_COMMUNITY): Payer: Medicare Other

## 2019-10-28 ENCOUNTER — Encounter (HOSPITAL_COMMUNITY): Payer: Self-pay | Admitting: Family Medicine

## 2019-10-28 ENCOUNTER — Inpatient Hospital Stay (HOSPITAL_COMMUNITY)
Admission: EM | Admit: 2019-10-28 | Discharge: 2019-11-01 | DRG: 065 | Disposition: A | Discharge status: Skilled Nursing Facility | Payer: Medicare Other | Attending: Internal Medicine | Admitting: Internal Medicine

## 2019-10-28 DIAGNOSIS — I672 Cerebral atherosclerosis: Secondary | ICD-10-CM | POA: Diagnosis present

## 2019-10-28 DIAGNOSIS — M19041 Primary osteoarthritis, right hand: Secondary | ICD-10-CM | POA: Diagnosis present

## 2019-10-28 DIAGNOSIS — Z888 Allergy status to other drugs, medicaments and biological substances status: Secondary | ICD-10-CM

## 2019-10-28 DIAGNOSIS — I480 Paroxysmal atrial fibrillation: Secondary | ICD-10-CM | POA: Diagnosis present

## 2019-10-28 DIAGNOSIS — Z20822 Contact with and (suspected) exposure to covid-19: Secondary | ICD-10-CM | POA: Diagnosis present

## 2019-10-28 DIAGNOSIS — R29714 NIHSS score 14: Secondary | ICD-10-CM | POA: Diagnosis present

## 2019-10-28 DIAGNOSIS — G609 Hereditary and idiopathic neuropathy, unspecified: Secondary | ICD-10-CM | POA: Diagnosis present

## 2019-10-28 DIAGNOSIS — Z79899 Other long term (current) drug therapy: Secondary | ICD-10-CM | POA: Diagnosis not present

## 2019-10-28 DIAGNOSIS — I083 Combined rheumatic disorders of mitral, aortic and tricuspid valves: Secondary | ICD-10-CM | POA: Diagnosis present

## 2019-10-28 DIAGNOSIS — Z800 Family history of malignant neoplasm of digestive organs: Secondary | ICD-10-CM

## 2019-10-28 DIAGNOSIS — M160 Bilateral primary osteoarthritis of hip: Secondary | ICD-10-CM | POA: Diagnosis present

## 2019-10-28 DIAGNOSIS — R296 Repeated falls: Secondary | ICD-10-CM | POA: Diagnosis present

## 2019-10-28 DIAGNOSIS — E782 Mixed hyperlipidemia: Secondary | ICD-10-CM | POA: Diagnosis not present

## 2019-10-28 DIAGNOSIS — I482 Chronic atrial fibrillation, unspecified: Secondary | ICD-10-CM | POA: Diagnosis present

## 2019-10-28 DIAGNOSIS — M19042 Primary osteoarthritis, left hand: Secondary | ICD-10-CM | POA: Diagnosis present

## 2019-10-28 DIAGNOSIS — E876 Hypokalemia: Secondary | ICD-10-CM | POA: Diagnosis present

## 2019-10-28 DIAGNOSIS — E785 Hyperlipidemia, unspecified: Secondary | ICD-10-CM | POA: Diagnosis not present

## 2019-10-28 DIAGNOSIS — Z8679 Personal history of other diseases of the circulatory system: Secondary | ICD-10-CM | POA: Diagnosis not present

## 2019-10-28 DIAGNOSIS — B9561 Methicillin susceptible Staphylococcus aureus infection as the cause of diseases classified elsewhere: Secondary | ICD-10-CM | POA: Diagnosis present

## 2019-10-28 DIAGNOSIS — I63511 Cerebral infarction due to unspecified occlusion or stenosis of right middle cerebral artery: Principal | ICD-10-CM | POA: Diagnosis present

## 2019-10-28 DIAGNOSIS — I639 Cerebral infarction, unspecified: Secondary | ICD-10-CM | POA: Diagnosis not present

## 2019-10-28 DIAGNOSIS — N390 Urinary tract infection, site not specified: Secondary | ICD-10-CM | POA: Diagnosis present

## 2019-10-28 DIAGNOSIS — Z66 Do not resuscitate: Secondary | ICD-10-CM | POA: Diagnosis present

## 2019-10-28 DIAGNOSIS — I10 Essential (primary) hypertension: Secondary | ICD-10-CM | POA: Diagnosis not present

## 2019-10-28 DIAGNOSIS — Z885 Allergy status to narcotic agent status: Secondary | ICD-10-CM

## 2019-10-28 DIAGNOSIS — I340 Nonrheumatic mitral (valve) insufficiency: Secondary | ICD-10-CM | POA: Diagnosis not present

## 2019-10-28 DIAGNOSIS — F331 Major depressive disorder, recurrent, moderate: Secondary | ICD-10-CM | POA: Diagnosis not present

## 2019-10-28 DIAGNOSIS — Z881 Allergy status to other antibiotic agents status: Secondary | ICD-10-CM

## 2019-10-28 DIAGNOSIS — Z85828 Personal history of other malignant neoplasm of skin: Secondary | ICD-10-CM | POA: Diagnosis not present

## 2019-10-28 DIAGNOSIS — M810 Age-related osteoporosis without current pathological fracture: Secondary | ICD-10-CM | POA: Diagnosis not present

## 2019-10-28 DIAGNOSIS — I361 Nonrheumatic tricuspid (valve) insufficiency: Secondary | ICD-10-CM | POA: Diagnosis not present

## 2019-10-28 DIAGNOSIS — G8194 Hemiplegia, unspecified affecting left nondominant side: Secondary | ICD-10-CM | POA: Diagnosis present

## 2019-10-28 DIAGNOSIS — Z880 Allergy status to penicillin: Secondary | ICD-10-CM

## 2019-10-28 DIAGNOSIS — R531 Weakness: Secondary | ICD-10-CM | POA: Diagnosis present

## 2019-10-28 DIAGNOSIS — Z8249 Family history of ischemic heart disease and other diseases of the circulatory system: Secondary | ICD-10-CM | POA: Diagnosis not present

## 2019-10-28 DIAGNOSIS — T1490XA Injury, unspecified, initial encounter: Secondary | ICD-10-CM

## 2019-10-28 LAB — PROTIME-INR
INR: 1 (ref 0.8–1.2)
Prothrombin Time: 12.7 seconds (ref 11.4–15.2)

## 2019-10-28 LAB — DIFFERENTIAL
Abs Immature Granulocytes: 0.03 10*3/uL (ref 0.00–0.07)
Basophils Absolute: 0 10*3/uL (ref 0.0–0.1)
Basophils Relative: 0 %
Eosinophils Absolute: 0 10*3/uL (ref 0.0–0.5)
Eosinophils Relative: 0 %
Immature Granulocytes: 0 %
Lymphocytes Relative: 10 %
Lymphs Abs: 0.7 10*3/uL (ref 0.7–4.0)
Monocytes Absolute: 0.3 10*3/uL (ref 0.1–1.0)
Monocytes Relative: 4 %
Neutro Abs: 5.8 10*3/uL (ref 1.7–7.7)
Neutrophils Relative %: 86 %

## 2019-10-28 LAB — COMPREHENSIVE METABOLIC PANEL
ALT: 21 U/L (ref 0–44)
AST: 26 U/L (ref 15–41)
Albumin: 3.8 g/dL (ref 3.5–5.0)
Alkaline Phosphatase: 148 U/L — ABNORMAL HIGH (ref 38–126)
Anion gap: 10 (ref 5–15)
BUN: 9 mg/dL (ref 8–23)
CO2: 26 mmol/L (ref 22–32)
Calcium: 9.3 mg/dL (ref 8.9–10.3)
Chloride: 106 mmol/L (ref 98–111)
Creatinine, Ser: 0.75 mg/dL (ref 0.44–1.00)
GFR calc Af Amer: >60 mL/min (ref >60)
GFR calc non Af Amer: >60 mL/min (ref >60)
Glucose, Bld: 136 mg/dL — ABNORMAL HIGH (ref 70–99)
Potassium: 3.9 mmol/L (ref 3.5–5.1)
Sodium: 142 mmol/L (ref 135–145)
Total Bilirubin: 0.9 mg/dL (ref 0.3–1.2)
Total Protein: 7.4 g/dL (ref 6.5–8.1)

## 2019-10-28 LAB — CBC
HCT: 38.5 % (ref 36.0–46.0)
Hemoglobin: 12.9 g/dL (ref 12.0–15.0)
MCH: 32.3 pg (ref 26.0–34.0)
MCHC: 33.5 g/dL (ref 30.0–36.0)
MCV: 96.5 fL (ref 80.0–100.0)
Platelets: 233 10*3/uL (ref 150–400)
RBC: 3.99 MIL/uL (ref 3.87–5.11)
RDW: 14 % (ref 11.5–15.5)
WBC: 6.8 10*3/uL (ref 4.0–10.5)
nRBC: 0 % (ref 0.0–0.2)

## 2019-10-28 LAB — SARS CORONAVIRUS 2 BY RT PCR (HOSPITAL ORDER, PERFORMED IN ~~LOC~~ HOSPITAL LAB): SARS Coronavirus 2: NEGATIVE

## 2019-10-28 LAB — LIPID PANEL
Cholesterol: 379 mg/dL — ABNORMAL HIGH (ref 0–200)
HDL: 81 mg/dL (ref >40)
LDL Cholesterol: 280 mg/dL — ABNORMAL HIGH (ref 0–99)
Total CHOL/HDL Ratio: 4.7 RATIO
Triglycerides: 88 mg/dL (ref <150)
VLDL: 18 mg/dL (ref 0–40)

## 2019-10-28 LAB — I-STAT CHEM 8, ED
BUN: 10 mg/dL (ref 8–23)
Calcium, Ion: 1.1 mmol/L — ABNORMAL LOW (ref 1.15–1.40)
Chloride: 121 mmol/L — ABNORMAL HIGH (ref 98–111)
Creatinine, Ser: 0.8 mg/dL (ref 0.44–1.00)
Glucose, Bld: 137 mg/dL — ABNORMAL HIGH (ref 70–99)
HCT: 36 % (ref 36.0–46.0)
Hemoglobin: 12.2 g/dL (ref 12.0–15.0)
Potassium: 4 mmol/L (ref 3.5–5.1)
Sodium: 143 mmol/L (ref 135–145)
TCO2: 27 mmol/L (ref 22–32)

## 2019-10-28 LAB — HEMOGLOBIN A1C
Hgb A1c MFr Bld: 5.1 % (ref 4.8–5.6)
Mean Plasma Glucose: 99.67 mg/dL

## 2019-10-28 LAB — APTT: aPTT: 29 seconds (ref 24–36)

## 2019-10-28 LAB — CBG MONITORING, ED: Glucose-Capillary: 126 mg/dL — ABNORMAL HIGH (ref 70–99)

## 2019-10-28 MED ORDER — ATORVASTATIN CALCIUM 80 MG PO TABS
80.0000 mg | ORAL_TABLET | Freq: Every day | ORAL | Status: DC
  Administered 2019-10-29 – 2019-11-01 (×4): 80 mg via ORAL
  Filled 2019-10-28 (×4): qty 1

## 2019-10-28 MED ORDER — ASPIRIN EC 81 MG PO TBEC: 81.0000 mg | DELAYED_RELEASE_TABLET | Freq: Every day | ORAL | Status: DC

## 2019-10-28 MED ORDER — ACETAMINOPHEN 325 MG PO TABS
650.0000 mg | ORAL_TABLET | ORAL | Status: DC | PRN
  Administered 2019-10-29 – 2019-10-31 (×3): 650 mg via ORAL
  Filled 2019-10-28 (×3): qty 2

## 2019-10-28 MED ORDER — ACETAMINOPHEN 650 MG RE SUPP: 650.0000 mg | RECTAL | Status: DC | PRN

## 2019-10-28 MED ORDER — LATANOPROST 0.005 % OP SOLN
1.0000 [drp] | Freq: Every day | OPHTHALMIC | Status: DC
  Administered 2019-10-28 – 2019-10-31 (×4): 1 [drp] via OPHTHALMIC
  Filled 2019-10-28: qty 2.5

## 2019-10-28 MED ORDER — PAROXETINE HCL 20 MG PO TABS: 20.0000 mg | ORAL_TABLET | Freq: Every morning | ORAL | Status: DC

## 2019-10-28 MED ORDER — STROKE: EARLY STAGES OF RECOVERY BOOK
Freq: Once | Status: AC
  Filled 2019-10-28: qty 1

## 2019-10-28 MED ORDER — SODIUM CHLORIDE 0.9 % IV SOLN: INTRAVENOUS | Status: DC

## 2019-10-28 MED ORDER — IOHEXOL 350 MG/ML SOLN
75.0000 mL | Freq: Once | INTRAVENOUS | Status: AC | PRN
  Administered 2019-10-28: 75 mL via INTRAVENOUS

## 2019-10-28 MED ORDER — AMITRIPTYLINE HCL 10 MG PO TABS
5.0000 mg | ORAL_TABLET | Freq: Every evening | ORAL | Status: DC
  Administered 2019-10-29 – 2019-10-31 (×3): 5 mg via ORAL
  Filled 2019-10-28 (×5): qty 0.5

## 2019-10-28 MED ORDER — DICYCLOMINE HCL 10 MG PO CAPS
10.0000 mg | ORAL_CAPSULE | Freq: Four times a day (QID) | ORAL | Status: DC | PRN
  Filled 2019-10-28: qty 1

## 2019-10-28 MED ORDER — ACETAMINOPHEN 160 MG/5ML PO SOLN
650.0000 mg | ORAL | Status: DC | PRN
  Administered 2019-10-28: 650 mg
  Filled 2019-10-28: qty 20.3

## 2019-10-28 MED ORDER — CLOPIDOGREL BISULFATE 75 MG PO TABS
75.0000 mg | ORAL_TABLET | Freq: Every day | ORAL | Status: DC
  Administered 2019-10-29 – 2019-11-01 (×4): 75 mg via ORAL
  Filled 2019-10-28 (×4): qty 1

## 2019-10-28 MED ORDER — METOPROLOL TARTRATE 5 MG/5ML IV SOLN
5.0000 mg | Freq: Four times a day (QID) | INTRAVENOUS | Status: DC | PRN
  Administered 2019-10-28 – 2019-10-29 (×2): 2.5 mg via INTRAVENOUS
  Administered 2019-11-01: 5 mg via INTRAVENOUS
  Filled 2019-10-28 (×3): qty 5

## 2019-10-28 MED ORDER — IRBESARTAN 75 MG PO TABS
37.5000 mg | ORAL_TABLET | Freq: Every day | ORAL | Status: DC
  Administered 2019-10-29 – 2019-11-01 (×4): 37.5 mg via ORAL
  Filled 2019-10-28 (×6): qty 0.5

## 2019-10-28 MED ORDER — POTASSIUM CHLORIDE CRYS ER 10 MEQ PO TBCR: 10.0000 meq | EXTENDED_RELEASE_TABLET | Freq: Every morning | ORAL | Status: DC

## 2019-10-28 MED ORDER — SODIUM CHLORIDE 0.9% FLUSH
3.0000 mL | Freq: Once | INTRAVENOUS | Status: AC
  Administered 2019-10-28: 3 mL via INTRAVENOUS

## 2019-10-28 MED ORDER — CLONAZEPAM 0.25 MG PO TBDP
0.2500 mg | ORAL_TABLET | Freq: Every day | ORAL | Status: DC
  Administered 2019-10-29 – 2019-11-01 (×4): 0.25 mg via ORAL
  Filled 2019-10-28 (×4): qty 1

## 2019-10-28 NOTE — Plan of Care (Signed)
Discussed with patient and family plan of care, pain management and gave them my numbers to call for updates tonight with some teach back displayed.

## 2019-10-28 NOTE — H&P (Signed)
[facility] History and Physical  Zakara Beaird [id number] DOB: [birth year] DOA: 10/28/2019  Referring physician: Dr. Ralene Bathe PCP: Rusty Aus, MD   Chief Complaint: left sided weakness  HPI: Ferris Hermosillo is a 86 y.o. female with hx of HTN, HLD, Afib not on anticoagulation due to hx of falls, OA, depression, who presented from home via EMS with L sided deficits and was found to have an acute stroke on imaging.   Majority of history from caregiver Holley Raring who is at bedside. Patient able to answer simple questions but when asked why she is being admitted to the hospital (confirmed this had already been discussed with her) she stated it was due to a bladder issue and seemed surprised she had had a stroke.   Per caregiver she was standing by bed on Thursday, started to come off the bed and suddenly fell. Felt like her leg gave out. Caregiver feels like left leg has not worked well since then. EMS was called that day, patient refused to be transported to the hospital.   Today could not move her left side at all including her arm as well as abdominal pain. Tried to give some bentyl and she had difficulty swallowing. At baseline patient performs most of her ADLs. At this point caregiver was extremely concerned and convinced patient to be brought to the hospital for an evaluation.   Lives at home with 24h caregivers that rotate through the day. Caregiver reports that although they assist her in many ways she is mostly independent for majority of ADLs.    Chart review shows recent admission 09/15/2019 - 4/20-2021 for syncope. At taht time unremarkable CT head, largely unremarkable TTE (EF 60-65%, G1DD, no WMA, mildly elevated PA pressure, mild dilation of LA/RA), ultimately thought to be vasovagal. [place]so had S.Aureus UTI treated w bactrim.    Review of Systems:  Pertinent positives and negative per HPI, all others reviewed and negative  Past Medical History:  Diagnosis  Date  . Anemia    distant past  . Arthritis    fingers, hips  . Cancer ([place])    skin cancer; some spots on left side of face; been 5 years since last treated;   . Headache   . History of echocardiogram    a. TTE 04/06/17: EF > 55%, no RWMA, no LA mass, severe TR, RV systolic dysfunction, elevated PASP; b. TTE 04/2017: EF 65-70%, no RWMA, Gr1DD, calcified mitral annulus w/ mild MR, mild to mod dilated LA w/ irregular, echodense material in the LA incompletely visualized, possibly representing artifact vs thrombus, or intracardiac mass, rec TEE, mod TR, mildly dilated RV, inc PASP  . History of nuclear stress test    a. 12/18: no sig ischemia, low risk  . HLD (hyperlipidemia)   . Left atrial mass    a. see echo from 04/2017 - ? mass vs LA thrombus in the setting of PAF -->Eliquis.  . Osteoporosis   . PAF (paroxysmal atrial fibrillation) ([place])    a. diagnosed 05/10/2017; b. CHADS2VASc --> 3 (age x 2, female)--> Eliquis 2.5 (age/wt).   Past Surgical History:  Procedure Laterality Date  . CATARACT EXTRACTION W/PHACO Left 09/14/2015   Procedure: CATARACT EXTRACTION PHACO AND INTRAOCULAR LENS PLACEMENT (IOC);  Surgeon: Ronnell Freshwater, MD;  Location: [facility];  Service: Ophthalmology;  Laterality: Left;  [place] Right    cataract surgery schedule for Easter Monday 2017;    Social History:  reports that she  has never smoked. She has never used smokeless tobacco. She reports that she does not drink alcohol or use drugs.  Allergies  Allergen Reactions  . Azithromycin Shortness Of Breath and Other (See Comments)    Syncope Can not stand up, feels very sick, weak Syncope Syncope Can not stand up, feels very sick, weak Syncope Syncope Can not stand up, feels very sick, weak Syncope Can not stand up, feels very sick, weak Syncope  . Clindamycin Other (See Comments)    GI problem  GI problem  GI problem   . Clindamycin/Lincomycin   . Hyoscyamine  Diarrhea  . Lincomycin Other (See Comments)  . Maxitrol [Neomycin-Polymyxin-Dexameth]     Eye irritation  . Other Other (See Comments)    Eye irritation Eye irritation Eye irritation  . Timolol Other (See Comments)    Eye irritation Eye irritation Eye irritation Eye irritation  . Codeine Other (See Comments) and Nausea Only    Altered mental status FATIGUE, SICK FEELING Altered mental status Altered mental status FATIGUE, SICK FEELING  . Penicillins Rash    Has patient had a PCN reaction causing immediate rash, facial/tongue/throat swelling, SOB or lightheadedness with hypotension: Yes Has patient had a PCN reaction causing severe rash involving mucus membranes or skin necrosis: No Has patient had a PCN reaction that required hospitalization: No Has patient had a PCN reaction occurring within the last 10 years: No If all of the above answers are "NO", then may proceed with Cephalosporin use.    Family History  Problem Relation Age of Onset  . CAD Mother   . Arrhythmia Mother   . Liver cancer Father      Prior to Admission medications   Medication Sig Start Date End Date Taking? Authorizing Provider  amitriptyline (ELAVIL) 10 MG tablet Take 5 mg by mouth every evening. 10/13/19  Yes [provider]  Cholecalciferol (VITAMIN D3) 50 MCG (2000 UT) TABS Take 2,000 Units by mouth every evening. With food   Yes [provider]  clonazePAM (KLONOPIN) 0.25 MG disintegrating tablet Take 0.25 mg by mouth daily before breakfast. 09/20/19  Yes [provider]  dicyclomine (BENTYL) 10 MG capsule Take 10 mg by mouth every 6 (six) hours as needed (abdominal pain/cramping).  09/16/19  Yes [provider]  LUMIGAN 0.01 % SOLN Place 1 drop into both eyes at bedtime.    Yes [provider]  olmesartan (BENICAR) 20 MG tablet Take 5 mg by mouth at bedtime.    Yes [provider]  PARoxetine (PAXIL) 20 MG tablet Take 20 mg by mouth in the morning.  With food   Yes [provider]  potassium chloride (KLOR-CON) 10 MEQ tablet Take 10 mEq by mouth in the morning. With food 10/21/19  Yes [provider]  Probiotic Product (ALIGN) 4 MG CAPS Take 1 capsule by mouth in the morning.    Yes [provider]  acetaminophen (TYLENOL) 325 MG tablet Take 2 tablets (650 mg total) by mouth every 6 (six) hours as needed. Patient not taking: Reported on 10/28/2019 07/15/19   Carrie Mew, MD  amLODipine (NORVASC) 2.5 MG tablet Take 1 tablet (2.5 mg total) by mouth daily. Patient not taking: Reported on 10/28/2019 09/18/19   Jennye Boroughs, MD  feeding supplement, ENSURE ENLIVE, (ENSURE ENLIVE) LIQD Take 237 mLs by mouth 2 (two) times daily between meals. Patient not taking: Reported on 10/28/2019 05/12/17   Fritzi Mandes, MD  sulfamethoxazole-trimethoprim (BACTRIM DS) 800-160 MG tablet Take 1 tablet  by mouth every 12 (twelve) hours. Patient not taking: Reported on 10/28/2019 09/17/19   Jennye Boroughs, MD   Physical Exam: Vitals:   10/28/19 0949 10/28/19 0958 10/28/19 1000 10/28/19 1130  BP:   129/90 (!) 141/83  Pulse:   78 68  Resp:   (!) 22 17  Temp:      TempSrc:      SpO2: 94% 96% 95% 94%  Weight:      Height:        Wt Readings from Last 3 Encounters:  10/28/19 54.3 kg  09/15/19 57.6 kg  07/15/19 57.6 kg    General:  Appears calm and comfortable Eyes: PERRL, normal lids, irises & conjunctiva ENT: grossly normal hearing, lips & tongue Neck: no LAD, masses or thyromegaly Cardiovascular: RRR, no m/r/g. No LE edema. Telemetry: SR, no arrhythmias  Respiratory: CTA bilaterally, no w/r/r. Normal respiratory effort. Abdomen: soft, ntnd Skin: LLE with bandage in place, dry blood on 3/4th digits of L foot.  Musculoskeletal: appropriate tone/bulk accounting for age Psychiatric: grossly normal mood and affect, speech fluent and appropriate Neurologic: CN appear grossly intact (visual fields not tested). Unable to move LUE  or LLE to any significant degree. Strength 5/5 in RUE/RLE. Reports sensation intact to light touch in bilateral UE/LE.          Labs on Admission:  Basic Metabolic Panel: Recent Labs  Lab 10/28/19 0918 10/28/19 0924  NA 142 143  K 3.9 4.0  CL 106 121*  CO2 26  --   GLUCOSE 136* 137*  BUN 9 10  CREATININE 0.75 0.80  CALCIUM 9.3  --    Liver Function Tests: Recent Labs  Lab 10/28/19 0918  AST 26  ALT 21  ALKPHOS 148*  BILITOT 0.9  PROT 7.4  ALBUMIN 3.8   No results for input(s): LIPASE, AMYLASE in the last 168 hours. No results for input(s): AMMONIA in the last 168 hours. CBC: Recent Labs  Lab 10/28/19 0918 10/28/19 0924  WBC 6.8  --   NEUTROABS 5.8  --   HGB 12.9 12.2  HCT 38.5 36.0  MCV 96.5  --   PLT 233  --    Cardiac Enzymes: No results for input(s): CKTOTAL, CKMB, CKMBINDEX, TROPONINI in the last 168 hours.  BNP (last 3 results) No results for input(s): BNP in the last 8760 hours.  ProBNP (last 3 results) No results for input(s): PROBNP in the last 8760 hours.  CBG: Recent Labs  Lab 10/28/19 0915  GLUCAP 126*    Radiological Exams on Admission: CT Code Stroke CTA Head W/WO contrast  Result Date: 10/28/2019 CLINICAL DATA:  Left-sided weakness EXAM: CT ANGIOGRAPHY HEAD AND NECK TECHNIQUE: Multidetector CT imaging of the head and neck was performed using the standard protocol during bolus administration of intravenous contrast. Multiplanar CT image reconstructions and MIPs were obtained to evaluate the vascular anatomy. Carotid stenosis measurements (when applicable) are obtained utilizing NASCET criteria, using the distal internal carotid diameter as the denominator. CONTRAST:  27mL OMNIPAQUE IOHEXOL 350 MG/ML SOLN COMPARISON:  None. FINDINGS: CTA NECK Aortic arch: Calcified and noncalcified plaque along the visualized thoracic aorta. Great vessel origins are patent. Right carotid system: Patent. There is calcified plaque at the ICA origin causing less  than 50% stenosis. Left carotid system: Patent. Minimal calcified plaque at the ICA origin without measurable stenosis. Vertebral arteries: Patent and codominant. Skeleton: Cervical spine dictated separately. Other neck: No mass or adenopathy. Upper chest: No apical lung mass. Review of the  MIP images confirms the above findings CTA HEAD Anterior circulation: Intracranial internal carotid arteries are patent. Right middle cerebral artery is patent with moderate to severe stenosis of the mid M1 segment. Mild stenoses are present more distally. Left middle cerebral artery is patent. There is mild stenosis of the proximal left M1 segment. Additional mild stenosis are present more distally. Anterior cerebral arteries are patent. There is diffuse atherosclerotic irregularity with multifocal stenoses particularly of bilateral A2 and A3 segments, some of which are marked. Posterior circulation: Intracranial vertebral arteries are patent. Basilar artery is patent. Posterior cerebral arteries are patent with atherosclerotic irregularity particularly of bilateral distal P2 and P3 segments with areas of moderate and severe stenosis. Venous sinuses: Patent as allowed by contrast bolus timing. Review of the MIP images confirms the above findings IMPRESSION: No large vessel occlusion or hemodynamically significant stenosis in the neck. Patent intracranial circulation with multifocal atherosclerosis including moderate to severe stenosis of the mid right M1 MCA and right A2/A3 ACA. Electronically Signed   By: Macy Mis M.D.   On: 10/28/2019 10:21   DG Chest 1 View  Result Date: 10/28/2019 CLINICAL DATA:  Left-sided weakness.  Fall. EXAM: CHEST  1 VIEW COMPARISON:  09/15/2019 FINDINGS: Heart size is mildly enlarged but unchanged. Coarse lung markings are suggestive for chronic changes. No focal airspace disease. Negative for a pneumothorax. No gross abnormality to the osseous structures. IMPRESSION: No acute chest  abnormality. Electronically Signed   By: Markus Daft M.D.   On: 10/28/2019 10:57   DG Ankle Complete Left  Result Date: 10/28/2019 CLINICAL DATA:  Fall 4 days ago. Left ankle pain and decreased mobility. EXAM: LEFT ANKLE COMPLETE - 3+ VIEW COMPARISON:  None. FINDINGS: There is no evidence of fracture, dislocation, or joint effusion. There is no evidence of arthropathy or other focal bone abnormality. Generalized osteopenia and mild peripheral vascular calcification noted. IMPRESSION: No acute findings. Electronically Signed   By: Marlaine Hind M.D.   On: 10/28/2019 11:00   CT Code Stroke CTA Neck W/WO contrast  Result Date: 10/28/2019 CLINICAL DATA:  Left-sided weakness EXAM: CT ANGIOGRAPHY HEAD AND NECK TECHNIQUE: Multidetector CT imaging of the head and neck was performed using the standard protocol during bolus administration of intravenous contrast. Multiplanar CT image reconstructions and MIPs were obtained to evaluate the vascular anatomy. Carotid stenosis measurements (when applicable) are obtained utilizing NASCET criteria, using the distal internal carotid diameter as the denominator. CONTRAST:  32mL OMNIPAQUE IOHEXOL 350 MG/ML SOLN COMPARISON:  None. FINDINGS: CTA NECK Aortic arch: Calcified and noncalcified plaque along the visualized thoracic aorta. Great vessel origins are patent. Right carotid system: Patent. There is calcified plaque at the ICA origin causing less than 50% stenosis. Left carotid system: Patent. Minimal calcified plaque at the ICA origin without measurable stenosis. Vertebral arteries: Patent and codominant. Skeleton: Cervical spine dictated separately. Other neck: No mass or adenopathy. Upper chest: No apical lung mass. Review of the MIP images confirms the above findings CTA HEAD Anterior circulation: Intracranial internal carotid arteries are patent. Right middle cerebral artery is patent with moderate to severe stenosis of the mid M1 segment. Mild stenoses are present more  distally. Left middle cerebral artery is patent. There is mild stenosis of the proximal left M1 segment. Additional mild stenosis are present more distally. Anterior cerebral arteries are patent. There is diffuse atherosclerotic irregularity with multifocal stenoses particularly of bilateral A2 and A3 segments, some of which are marked. Posterior circulation: Intracranial vertebral arteries are patent. Basilar  artery is patent. Posterior cerebral arteries are patent with atherosclerotic irregularity particularly of bilateral distal P2 and P3 segments with areas of moderate and severe stenosis. Venous sinuses: Patent as allowed by contrast bolus timing. Review of the MIP images confirms the above findings IMPRESSION: No large vessel occlusion or hemodynamically significant stenosis in the neck. Patent intracranial circulation with multifocal atherosclerosis including moderate to severe stenosis of the mid right M1 MCA and right A2/A3 ACA. Electronically Signed   By: Macy Mis M.D.   On: 10/28/2019 10:21   CT C-SPINE NO CHARGE  Result Date: 10/28/2019 CLINICAL DATA:  Trauma, left-sided weakness EXAM: CT CERVICAL SPINE WITHOUT CONTRAST TECHNIQUE: CT of the cervical spine was reconstructed from same day CTA of the neck performed with intravenous contrast. No additional contrast was administered. Axial, coronal, and sagittal reformats were obtained COMPARISON:  None. FINDINGS: Alignment: No significant listhesis. Skull base and vertebrae: No acute cervical spine fracture. There is partial fusion across the C3-C4 and C4-C5 disc spaces. There is fusion of several facets. Soft tissues and spinal canal: No prevertebral fluid or swelling. No visible canal hematoma. Disc levels: Multilevel degenerative changes are present including disc space narrowing, endplate osteophytes, and facet and uncovertebral hypertrophy. There is no high-grade osseous encroachment on the spinal canal. Upper chest: No apical lung mass.  Other: None. IMPRESSION: No acute cervical spine fracture. Electronically Signed   By: Macy Mis M.D.   On: 10/28/2019 11:11   DG Foot Complete Left  Result Date: 10/28/2019 CLINICAL DATA:  Fall 4 days ago. Left foot pain and decreased mobility. Initial encounter. EXAM: LEFT FOOT - COMPLETE 3+ VIEW COMPARISON:  None. FINDINGS: There is no evidence of fracture or dislocation. Mild osteoarthritis is seen involving the interphalangeal joints of the phalanges. Moderate to severe osteoarthritis is seen involving the intertarsal joints and tarsal-metatarsal joints of the midfoot. Small plantar calcaneal bone spur noted. Generalized osteopenia is noted, but no focal lytic or sclerotic bone lesions are identified. Soft tissues are unremarkable. IMPRESSION: 1. No acute findings. 2. Osteoarthritis and osteopenia. Electronically Signed   By: Marlaine Hind M.D.   On: 10/28/2019 10:59   DG Hip Unilat W or Wo Pelvis 2-3 Views Left  Result Date: 10/28/2019 CLINICAL DATA:  Left-sided weakness status post fall EXAM: DG HIP (WITH OR WITHOUT PELVIS) 2-3V LEFT COMPARISON:  None. FINDINGS: There is no evidence of hip fracture or dislocation. There is no evidence of arthropathy or other focal bone abnormality. There is generalized osteopenia. IMPRESSION: No acute osseous injury of the left. Given the patient's age and osteopenia, if there is persistent clinical concern for an occult hip fracture, a MRI of the hip is recommended for increased sensitivity. Electronically Signed   By: Kathreen Devoid   On: 10/28/2019 10:59   CT HEAD CODE STROKE WO CONTRAST  Result Date: 10/28/2019 CLINICAL DATA:  Code stroke.  Left-sided weakness EXAM: CT HEAD WITHOUT CONTRAST TECHNIQUE: Contiguous axial images were obtained from the base of the skull through the vertex without intravenous contrast. COMPARISON:  None. FINDINGS: Brain: There is no acute intracranial hemorrhage or mass effect. There is new hypoattenuation with loss of gray-white  differentiation in the posterior right temporal lobe. Additional smaller area of loss of gray-white differentiation involving the parasagittal posterior right frontal lobe. There is a new 8 mm focus of hypoattenuation in the right centrum semiovale. Prominence of the ventricles and sulci reflects stable parenchymal volume loss. Additional patchy and confluent hypoattenuation in the supratentorial white matter is  nonspecific but probably reflects stable chronic microvascular ischemic changes. Vascular: No hyperdense vessel. Skull: Unremarkable Sinuses/Orbits: Mild mucosal thickening. Bilateral lens replacements. Other: Mastoid air cells are clear. ASPECTS [facility] Stroke Program Early CT Score) - Ganglionic level infarction (caudate, lentiform nuclei, internal capsule, insula, M1-M3 cortex): 6 - Supraganglionic infarction (M4-M6 cortex): 3 Total score (0-10 with 10 being normal): 10 IMPRESSION: No acute intracranial hemorrhage. Acute infarction involving the posterior right temporal lobe. Additional smaller acute infarction involving the parasagittal posterior right frontal lobe. New age-indeterminate subcentimeter infarct of the right centrum semiovale. These results were communicated to Dr. Cheral Marker at 9:30 amon 5/31/2021by text page via the [facility] messaging system. Electronically Signed   By: Macy Mis M.D.   On: 10/28/2019 09:47    EKG: Independently reviewed. Done 10/28/2019, NSR, L axis, signs of LVH but no ischemic changes. Compared to prior on 09/15/2019 no significant change  Assessment/Plan Larin Gravett is a 86 y.o. female with hx of HTN, HLD, Afib not on anticoagulation due to hx of falls, OA, depression, who presented from home via EMS with L sided deficits and was found to have an acute stroke on imaging.    #Subacute R frontal/temporal stroke Patient presenting with marked L sided neurological deficits and imaging findings c/w R frontal/temporal stroke. Initial event likely  occurred on 10/24/2019 per history from caregiver. Etiology likely atherosclerotic disease given imaging findings, though patient does have a history of pAFib not on anticoagulation (?hx of falls?). Seen by Neurology, appreciate recommendations.  - admit to med/surg - DAPT w ASA and plavix per Neuro recs - SCDs - MRI brain w/o contrast - PT, OT, SLP consults - TTE (last done 08/2019, largely unremarkable at that time) - SBP goal 120-140 - tele - neuro checks q2h x12h, thereafter q4h - lipis and A1c ordered - mIVF @ 100 cc/hr - NPO except meds pending SLP eval - cont home olmesartan 5mg  daily>irbesartan 37.5mg  daily while inpatient  #Falls #OA/Osteopenia Reported history of multiple falls at home. Trauma eval here shows no acute fracture of any portion of the LLE. Per read no obvious acute hip fracture, however pending clinical course consider MRI to rule out occult fracture.    #Known Medical Problems Chronic abdominal pain: cont amitriptyline 5mg  daily, bentyl 10mg  q6h PRN Depression/anxiety: cont clonazepam 0.25mg  daily, paroxetine 20mg  daily Stress incontinence/Recent UTI: send UCx test of cure   Code Status: DNR/DNI, confirmed with patient, caregiver, and granddaughter Caryl Pina all present DVT Prophylaxis: SCDs Family Communication: caregiver and grand-daughter Caryl Pina at bedside at time of interview Disposition Plan: Inpatient, Med-Surg  Time spent: 11 min  Clarnce Flock MD/[facility]

## 2019-10-28 NOTE — Consult Note (Signed)
Referring Physician: Dr. Ralene Bathe    Chief Complaint: Left hemiplegia and left hemineglect.  HPI: Christy Waters is an 86 y.o. female presenting as a Code Stroke via EMS after she was noted by her home health aide to be weak on her left side. On EMS arrival, the patient's vitals were stable. She was noted to be weak on the left with rightward gaze deviation, but able to converse. She seemed to be unaware of her left sided deficits. She was last seen in the ED in April after a syncopal episode. She has been on Eliquis in the past, but it had been stopped per notes from her April evaluation. Reason for why anticoagulation was stopped is not known, but it may have been due to multiple falls. At baseline she ambulates independently and is conversant.   LKN is Thursday in the morning, during a previous EMS call, at which time the patient was not documented as having any significant left sided weakness. After that time and until this AM, any change in her neurological functioning relative to her baseline is not known, despite EMS contacting the patient's daughter as well as their discussion with home health aides. Apparently, none of the individuals who EMS had discussion with knew the patient's LKN. Per EMS, they have discussed the patient in person or by telephone with all of the individuals that they were able to, with the contact information that is available.   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    LSN: Thursday AM tPA Given: No: Out of time window.   Past Medical History:  Diagnosis Date  . Anemia    distant past  . Arthritis    fingers, hips  . Cancer ([place])    skin cancer; some spots on left side of face; been 5 years since last treated;   . Headache   . History of echocardiogram    a. TTE 04/06/17: EF > 55%, no RWMA, no LA mass, severe TR, RV systolic dysfunction, elevated PASP; b.  TTE 04/2017: EF 65-70%, no RWMA, Gr1DD, calcified mitral annulus w/ mild MR, mild to mod dilated LA w/ irregular, echodense material in the LA incompletely visualized, possibly representing artifact vs thrombus, or intracardiac mass, rec TEE, mod TR, mildly dilated RV, inc PASP  . History of nuclear stress test    a. 12/18: no sig ischemia, low risk  . HLD (hyperlipidemia)   . Left atrial mass    a. see echo from 04/2017 - ? mass vs LA thrombus in the setting of PAF -->Eliquis.  . Osteoporosis   . PAF (paroxysmal atrial fibrillation) ([place])    a. diagnosed 05/10/2017; b. CHADS2VASc --> 3 (age x 2, female)--> Eliquis 2.5 (age/wt).    Past Surgical History:  Procedure Laterality Date  . CATARACT EXTRACTION W/PHACO Left 09/14/2015   Procedure: CATARACT EXTRACTION PHACO AND INTRAOCULAR LENS PLACEMENT (IOC);  Surgeon: Ronnell Freshwater, MD;  Location: [facility];  Service: Ophthalmology;  Laterality: Left;  MALYUGIN  . EYE SURGERY Right    cataract surgery schedule for Easter Monday 2017;     Family History  Problem Relation Age of Onset  . CAD Mother   . Arrhythmia Mother   . Liver cancer Father    Social History:  reports that she has never smoked. She has never used smokeless tobacco. She reports that she does not drink alcohol or use drugs.  Allergies:  Allergies  Allergen Reactions  . Azithromycin Shortness Of Breath and Other (See Comments)    Syncope Can not stand up, feels very sick, weak Syncope Syncope Can not stand up, feels very sick, weak Syncope Syncope Can not stand up, feels very sick, weak Syncope Can not stand up, feels very sick, weak Syncope  . Clindamycin Other (See Comments)    GI problem  GI problem  GI problem   . Clindamycin/Lincomycin   . Hyoscyamine Diarrhea  . Lincomycin Other (See Comments)  . Maxitrol [Neomycin-Polymyxin-Dexameth]     Eye irritation  . Other Other (See Comments)    Eye irritation Eye irritation Eye  irritation  . Timolol Other (See Comments)    Eye irritation Eye irritation Eye irritation Eye irritation  . Codeine Other (See Comments) and Nausea Only    Altered mental status FATIGUE, SICK FEELING Altered mental status Altered mental status FATIGUE, SICK FEELING  . Penicillins Rash    Has patient had a PCN reaction causing immediate rash, facial/tongue/throat swelling, SOB or lightheadedness with hypotension: Yes Has patient had a PCN reaction causing severe rash involving mucus membranes or skin necrosis: No Has patient had a PCN reaction that required hospitalization: No Has patient had a PCN reaction occurring within the last 10 years: No If all of the above answers are "NO", then may proceed with Cephalosporin use.    Home Medications:  No current facility-administered medications on file prior to encounter.   Current Outpatient Medications on File Prior to Encounter  Medication Sig Dispense Refill  . acetaminophen (TYLENOL) 325 MG tablet Take 2 tablets (650 mg total) by mouth every 6 (six) hours as needed. 60 tablet 0  . ALPRAZolam (XANAX) 0.25 MG tablet Take 0.25 mg by mouth 2 (two) times daily with a meal.     . amLODipine (NORVASC) 2.5 MG tablet Take 1 tablet (2.5 mg total) by mouth daily. 30 tablet 0  . dicyclomine (BENTYL) 20 MG tablet Take 20 mg by mouth 4 (four) times daily as needed for spasms.    . feeding supplement, ENSURE ENLIVE, (ENSURE ENLIVE) LIQD Take 237 mLs by mouth 2 (two) times daily between meals. 237 mL 12  . LUMIGAN 0.01 % SOLN Place 1 drop into both eyes at bedtime.     Marland Kitchen olmesartan (BENICAR) 20 MG tablet Take 20 mg by mouth daily.     Marland Kitchen PARoxetine (PAXIL) 20 MG tablet Take 20 mg by mouth daily.     . potassium chloride (K-DUR) 10 MEQ tablet Take 10 mEq by mouth daily.     . Probiotic Product (ALIGN) 4 MG CAPS Take 1 capsule by mouth daily.     Marland Kitchen sulfamethoxazole-trimethoprim (BACTRIM DS) 800-160 MG tablet Take 1 tablet by mouth every 12 (twelve)  hours. 6 tablet 0     ROS: The patient has no neurological complaints in the context of her anosognosia.  Physical Examination: There were no vitals taken for this visit.  HEENT: Normocephalic Lungs: Respirations unlabored Ext: No edema. Left LE bruising/lacerations noted  Neurologic Examination: Mental Status: Alert, oriented to state and year, but not city, day of week or month. Requires additional time to ask orientation questions.Speech is fluent with intact comprehension for basic commands. Mild impairment when asked to repeat a phrase. Naming intact. Cranial Nerves: II:  PERRL. Left visual field cut.  III,IV, VI: No ptosis. Right sided gaze preference, but able to cross midline to the left with some difficulty.  V,VII: Smile symmetric, facial temp sensation  equal bilaterally VIII: hearing intact to voice IX,X: No hypophonia XI: Weak shoulder shrug on the left XII: Midline tongue extension  Motor: RUE and RLE 5/5 LUE 0/5 LLE 1/5 Sensory: Temp and light touch intact in all 4 extremities. There is extinction on the left with DSS to upper extremities, and on the RIGHT with DSS to lower extremities. Deep Tendon Reflexes:  2+ left brachioradialis, 1+ right brachioradialis Hypoactive patellars bilaterally. 0 achilles bilaterally Plantars: Right: downgoing   Left: upgoing Cerebellar: No ataxia with FNF on the right. Unable to perform on the left.  Gait: Unable to assess   Results for orders placed or performed during the hospital encounter of 10/28/19 (from the past 48 hour(s))  CBG monitoring, ED     Status: Abnormal   Collection Time: 10/28/19  9:15 AM  Result Value Ref Range   Glucose-Capillary 126 (H) 70 - 99 mg/dL    Comment: Glucose reference range applies only to samples taken after fasting for at least 8 hours.   No results found.  Assessment: 86 y.o. female presenting with left hemiplegia, left visual field cut and left hemineglect. 1. Exam findings and  clinical history are most consistent with a large right hemispheric ischemic infarction. 2. CT head: Positive for subacute infarction involving the posterior right temporal lobe. Additional smaller acute infarction involving the parasagittal posterior right frontal lobe. New age-indeterminate subcentimeter infarct of the right centrum semiovale. 3. CTA of head and neck: No large vessel occlusion or hemodynamically significant stenosis in the neck. Patent intracranial circulation with multifocal atherosclerosis including moderate to severe stenosis of the mid right M1 MCA and right A2/A3 ACA. 4. 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 Not on anticoagulation, most likely due to frequent falls  Plan: 1. Given severe intracranial atherosclerosis and inability to restart anticoagulation due to frequent falls, would start the patient on DAPT for secondary stroke prevention.  2. MRI of the brain without contrast 3. PT consult, OT consult, Speech consult 4. Echocardiogram 5. Out of permissive HTN time window. BP management with SBP goal of 120-140 6. Telemetry monitoring 7. Frequent neuro checks 8. HgbA1c, fasting lipid panel   @Electronically  signed: Dr. Kerney Elbe  10/28/2019, 9:21 AM

## 2019-10-28 NOTE — ED Provider Notes (Signed)
[facility] EMERGENCY DEPARTMENT Provider Note   CSN: [id number] Arrival date & time: 10/28/19  [id number]  An emergency department physician performed an initial assessment on this suspected stroke patient at 0914.  History Chief Complaint  Patient presents with  . Code Stroke  . Weakness    Christy Waters is a 86 y.o. female.  The history is provided by the patient, medical records and the EMS personnel. No language interpreter was used.   Christy Waters is a 86 y.o. female who presents to the Emergency Department complaining of code stroke.  Level V caveat due to confusion.  She presents to the ED by EMS as a code stroke for left sided weakness.  Last known normal on Thursday morning when EMS was called out for a fall.  She was assessed at that time but not transported.  She has a hx/o frequent falls.  Unclear if she has struck her head.  EMS noted left sided deficits and activated code stroke.  Pt complains of headache that started today.  She lives at home alone but has aides at home with her.      Past Medical History:  Diagnosis Date  . Anemia    distant past  . Arthritis    fingers, hips  . Cancer ([place])    skin cancer; some spots on left side of face; been 5 years since last treated;   . Headache   . History of echocardiogram    a. TTE 04/06/17: EF > 55%, no RWMA, no LA mass, severe TR, RV systolic dysfunction, elevated PASP; b. TTE 04/2017: EF 65-70%, no RWMA, Gr1DD, calcified mitral annulus w/ mild MR, mild to mod dilated LA w/ irregular, echodense material in the LA incompletely visualized, possibly representing artifact vs thrombus, or intracardiac mass, rec TEE, mod TR, mildly dilated RV, inc PASP  . History of nuclear stress test    a. 12/18: no sig ischemia, low risk  . HLD (hyperlipidemia)   . Left atrial mass    a. see echo from 04/2017 - ? mass vs LA thrombus in the setting of PAF -->Eliquis.  . Osteoporosis   . PAF (paroxysmal  atrial fibrillation) ([place])    a. diagnosed 05/10/2017; b. CHADS2VASc --> 3 (age x 2, female)--> Eliquis 2.5 (age/wt).    Patient Active Problem List   Diagnosis Date Noted  . Stroke ([place]) 10/28/2019  . Hypertensive urgency 09/16/2019  . Syncope, vasovagal 09/15/2019  . Syncope and collapse 09/15/2019  . Acute lower UTI 09/15/2019  . Pelvic pain in female 03/20/2018  . Cor pulmonale ([place]) 09/19/2017  . Low serum vitamin D 06/19/2017  . History of atrial fibrillation 06/19/2017  . Chest pain 05/10/2017  . Moderate episode of recurrent major depressive disorder ([place]) 04/04/2017  . Adult idiopathic generalized osteoporosis 04/13/2016  . 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 Irritable bowel syndrome with diarrhea 05/20/2014  . Abdominal pain, generalized 04/01/2014  . Atherosclerosis of aorta ([place]) 03/05/2014  . 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 Idiopathic peripheral neuropathy 03/05/2014  . Abdominal pain of unknown cause 02/06/2014  . MI (mitral incompetence) 02/06/2014  . Stress incontinence 02/06/2014  . Fibrosis of lung ([place]) 10/03/2013  . Generalized OA 10/03/2013  . HLD (hyperlipidemia) 10/03/2013  . BP (high blood pressure) 10/03/2013  . OP (osteoporosis) 10/03/2013  . Beat, premature ventricular 10/03/2013  . Ventricular premature depolarization 10/03/2013    Past Surgical History:  Procedure Laterality Date  . CATARACT EXTRACTION W/PHACO Left 09/14/2015  Procedure: CATARACT EXTRACTION PHACO AND INTRAOCULAR LENS PLACEMENT (IOC);  Surgeon: Ronnell Freshwater, MD;  Location: [facility];  Service: Ophthalmology;  Laterality: Left;  MALYUGIN  . EYE SURGERY Right    cataract surgery schedule for Easter Monday 2017;      OB History   No obstetric history on file.     Family History  Problem Relation Age of Onset  . CAD Mother   . Arrhythmia Mother   . Liver cancer Father     Social History   Tobacco Use  . Smoking status: Never Smoker  .  Smokeless tobacco: Never Used  Substance Use Topics  . Alcohol use: No  . Drug use: No    Home Medications Prior to Admission medications   Medication Sig Start Date End Date Taking? Authorizing Provider  amitriptyline (ELAVIL) 10 MG tablet Take 5 mg by mouth every evening. 10/13/19  Yes [provider]  Cholecalciferol (VITAMIN D3) 50 MCG (2000 UT) TABS Take 2,000 Units by mouth every evening. With food   Yes [provider]  clonazePAM (KLONOPIN) 0.25 MG disintegrating tablet Take 0.25 mg by mouth daily before breakfast. 09/20/19  Yes [provider]  dicyclomine (BENTYL) 10 MG capsule Take 10 mg by mouth every 6 (six) hours as needed (abdominal pain/cramping).  09/16/19  Yes [provider]  LUMIGAN 0.01 % SOLN Place 1 drop into both eyes at bedtime.    Yes [provider]  olmesartan (BENICAR) 20 MG tablet Take 5 mg by mouth at bedtime.    Yes [provider]  PARoxetine (PAXIL) 20 MG tablet Take 20 mg by mouth in the morning. With food   Yes [provider]  potassium chloride (KLOR-CON) 10 MEQ tablet Take 10 mEq by mouth in the morning. With food 10/21/19  Yes [provider]  Probiotic Product (ALIGN) 4 MG CAPS Take 1 capsule by mouth in the morning.    Yes [provider]  acetaminophen (TYLENOL) 325 MG tablet Take 2 tablets (650 mg total) by mouth every 6 (six) hours as needed. Patient not taking: Reported on 10/28/2019 07/15/19   Carrie Mew, MD  amLODipine (NORVASC) 2.5 MG tablet Take 1 tablet (2.5 mg total) by mouth daily. Patient not taking: Reported on 10/28/2019 09/18/19   Jennye Boroughs, MD  feeding supplement, ENSURE ENLIVE, (ENSURE ENLIVE) LIQD Take 237 mLs by mouth 2 (two) times daily between meals. Patient not taking: Reported on 10/28/2019 05/12/17   Fritzi Mandes, MD  sulfamethoxazole-trimethoprim (BACTRIM DS) 800-160 MG tablet Take 1 tablet by mouth every 12 (twelve) hours. Patient not taking:  Reported on 10/28/2019 09/17/19   Jennye Boroughs, MD    Allergies    Azithromycin, Clindamycin, Clindamycin/lincomycin, Hyoscyamine, Lincomycin, Maxitrol [neomycin-polymyxin-dexameth], Other, Timolol, Codeine, and Penicillins  Review of Systems   Review of Systems  All other systems reviewed and are negative.   Physical Exam Updated Vital Signs BP (!) 159/92   Pulse 74   Temp 97.8 F (36.6 C) (Oral)   Resp (!) 25   Ht 5\' 6"  (1.676 m)   Wt 54.3 kg   SpO2 97%   BMI 19.32 kg/m   Physical Exam Vitals and nursing note reviewed.  Constitutional:      Appearance: She is well-developed.  HENT:     Head: Normocephalic and atraumatic.  Cardiovascular:     Rate and Rhythm: Normal rate and regular rhythm.     Heart sounds: No murmur.  Pulmonary:     Effort:  Pulmonary effort is normal. No respiratory distress.     Breath sounds: Normal breath sounds.  Abdominal:     Palpations: Abdomen is soft.     Tenderness: There is no abdominal tenderness. There is no guarding or rebound.  Musculoskeletal:        General: No tenderness.     Comments: Skin tear to left shin without local tenderness.  2+ DP pulses bilaterally.  No significant left hip tenderness.    Skin:    General: Skin is warm and dry.  Neurological:     Mental Status: She is alert and oriented to person, place, and time.     Comments: Mildly confused.  Dense hemiparesis to LUE, LLE.  Right sided gaze preference.  Psychiatric:        Behavior: Behavior normal.     ED Results / Procedures / Treatments   Labs (all labs ordered are listed, but only abnormal results are displayed) Labs Reviewed  COMPREHENSIVE METABOLIC PANEL - Abnormal; Notable for the following components:      Result Value   Glucose, Bld 136 (*)    Alkaline Phosphatase 148 (*)    All other components within normal limits  LIPID PANEL - Abnormal; Notable for the following components:   Cholesterol 379 (*)    LDL Cholesterol 280 (*)    All other  components within normal limits  CBG MONITORING, ED - Abnormal; Notable for the following components:   Glucose-Capillary 126 (*)    All other components within normal limits  I-STAT CHEM 8, ED - Abnormal; Notable for the following components:   Chloride 121 (*)    Glucose, Bld 137 (*)    Calcium, Ion 1.10 (*)    All other components within normal limits  SARS CORONAVIRUS 2 BY RT PCR (HOSPITAL ORDER, [facility])  URINE CULTURE  PROTIME-INR  APTT  CBC  DIFFERENTIAL  HEMOGLOBIN A1C  CBC  COMPREHENSIVE METABOLIC PANEL  CBG MONITORING, ED    EKG None  Radiology CT Code Stroke CTA Head W/WO contrast  Result Date: 10/28/2019 CLINICAL DATA:  Left-sided weakness EXAM: CT ANGIOGRAPHY HEAD AND NECK TECHNIQUE: Multidetector CT imaging of the head and neck was performed using the standard protocol during bolus administration of intravenous contrast. Multiplanar CT image reconstructions and MIPs were obtained to evaluate the vascular anatomy. Carotid stenosis measurements (when applicable) are obtained utilizing NASCET criteria, using the distal internal carotid diameter as the denominator. CONTRAST:  73mL OMNIPAQUE IOHEXOL 350 MG/ML SOLN COMPARISON:  None. FINDINGS: CTA NECK Aortic arch: Calcified and noncalcified plaque along the visualized thoracic aorta. Great vessel origins are patent. Right carotid system: Patent. There is calcified plaque at the ICA origin causing less than 50% stenosis. Left carotid system: Patent. Minimal calcified plaque at the ICA origin without measurable stenosis. Vertebral arteries: Patent and codominant. Skeleton: Cervical spine dictated separately. Other neck: No mass or adenopathy. Upper chest: No apical lung mass. Review of the MIP images confirms the above findings CTA HEAD Anterior circulation: Intracranial internal carotid arteries are patent. Right middle cerebral artery is patent with moderate to severe stenosis of the mid M1 segment.  Mild stenoses are present more distally. Left middle cerebral artery is patent. There is mild stenosis of the proximal left M1 segment. Additional mild stenosis are present more distally. Anterior cerebral arteries are patent. There is diffuse atherosclerotic irregularity with multifocal stenoses particularly of bilateral A2 and A3 segments, some of which are marked. Posterior circulation: Intracranial vertebral  arteries are patent. Basilar artery is patent. Posterior cerebral arteries are patent with atherosclerotic irregularity particularly of bilateral distal P2 and P3 segments with areas of moderate and severe stenosis. Venous sinuses: Patent as allowed by contrast bolus timing. Review of the MIP images confirms the above findings IMPRESSION: No large vessel occlusion or hemodynamically significant stenosis in the neck. Patent intracranial circulation with multifocal atherosclerosis including moderate to severe stenosis of the mid right M1 MCA and right A2/A3 ACA. Electronically Signed   By: Macy Mis M.D.   On: 10/28/2019 10:21   DG Chest 1 View  Result Date: 10/28/2019 CLINICAL DATA:  Left-sided weakness.  Fall. EXAM: CHEST  1 VIEW COMPARISON:  09/15/2019 FINDINGS: Heart size is mildly enlarged but unchanged. Coarse lung markings are suggestive for chronic changes. No focal airspace disease. Negative for a pneumothorax. No gross abnormality to the osseous structures. IMPRESSION: No acute chest abnormality. Electronically Signed   By: Markus Daft M.D.   On: 10/28/2019 10:57   DG Ankle Complete Left  Result Date: 10/28/2019 CLINICAL DATA:  Fall 4 days ago. Left ankle pain and decreased mobility. EXAM: LEFT ANKLE COMPLETE - 3+ VIEW COMPARISON:  None. FINDINGS: There is no evidence of fracture, dislocation, or joint effusion. There is no evidence of arthropathy or other focal bone abnormality. Generalized osteopenia and mild peripheral vascular calcification noted. IMPRESSION: No acute findings.  Electronically Signed   By: Marlaine Hind M.D.   On: 10/28/2019 11:00   CT Code Stroke CTA Neck W/WO contrast  Result Date: 10/28/2019 CLINICAL DATA:  Left-sided weakness EXAM: CT ANGIOGRAPHY HEAD AND NECK TECHNIQUE: Multidetector CT imaging of the head and neck was performed using the standard protocol during bolus administration of intravenous contrast. Multiplanar CT image reconstructions and MIPs were obtained to evaluate the vascular anatomy. Carotid stenosis measurements (when applicable) are obtained utilizing NASCET criteria, using the distal internal carotid diameter as the denominator. CONTRAST:  74mL OMNIPAQUE IOHEXOL 350 MG/ML SOLN COMPARISON:  None. FINDINGS: CTA NECK Aortic arch: Calcified and noncalcified plaque along the visualized thoracic aorta. Great vessel origins are patent. Right carotid system: Patent. There is calcified plaque at the ICA origin causing less than 50% stenosis. Left carotid system: Patent. Minimal calcified plaque at the ICA origin without measurable stenosis. Vertebral arteries: Patent and codominant. Skeleton: Cervical spine dictated separately. Other neck: No mass or adenopathy. Upper chest: No apical lung mass. Review of the MIP images confirms the above findings CTA HEAD Anterior circulation: Intracranial internal carotid arteries are patent. Right middle cerebral artery is patent with moderate to severe stenosis of the mid M1 segment. Mild stenoses are present more distally. Left middle cerebral artery is patent. There is mild stenosis of the proximal left M1 segment. Additional mild stenosis are present more distally. Anterior cerebral arteries are patent. There is diffuse atherosclerotic irregularity with multifocal stenoses particularly of bilateral A2 and A3 segments, some of which are marked. Posterior circulation: Intracranial vertebral arteries are patent. Basilar artery is patent. Posterior cerebral arteries are patent with atherosclerotic irregularity  particularly of bilateral distal P2 and P3 segments with areas of moderate and severe stenosis. Venous sinuses: Patent as allowed by contrast bolus timing. Review of the MIP images confirms the above findings IMPRESSION: No large vessel occlusion or hemodynamically significant stenosis in the neck. Patent intracranial circulation with multifocal atherosclerosis including moderate to severe stenosis of the mid right M1 MCA and right A2/A3 ACA. Electronically Signed   By: Macy Mis M.D.   On: 10/28/2019  10:21   CT C-SPINE NO CHARGE  Result Date: 10/28/2019 CLINICAL DATA:  Trauma, left-sided weakness EXAM: CT CERVICAL SPINE WITHOUT CONTRAST TECHNIQUE: CT of the cervical spine was reconstructed from same day CTA of the neck performed with intravenous contrast. No additional contrast was administered. Axial, coronal, and sagittal reformats were obtained COMPARISON:  None. FINDINGS: Alignment: No significant listhesis. Skull base and vertebrae: No acute cervical spine fracture. There is partial fusion across the C3-C4 and C4-C5 disc spaces. There is fusion of several facets. Soft tissues and spinal canal: No prevertebral fluid or swelling. No visible canal hematoma. Disc levels: Multilevel degenerative changes are present including disc space narrowing, endplate osteophytes, and facet and uncovertebral hypertrophy. There is no high-grade osseous encroachment on the spinal canal. Upper chest: No apical lung mass. Other: None. IMPRESSION: No acute cervical spine fracture. Electronically Signed   By: Macy Mis M.D.   On: 10/28/2019 11:11   DG Foot Complete Left  Result Date: 10/28/2019 CLINICAL DATA:  Fall 4 days ago. Left foot pain and decreased mobility. Initial encounter. EXAM: LEFT FOOT - COMPLETE 3+ VIEW COMPARISON:  None. FINDINGS: There is no evidence of fracture or dislocation. Mild osteoarthritis is seen involving the interphalangeal joints of the phalanges. Moderate to severe osteoarthritis is  seen involving the intertarsal joints and tarsal-metatarsal joints of the midfoot. Small plantar calcaneal bone spur noted. Generalized osteopenia is noted, but no focal lytic or sclerotic bone lesions are identified. Soft tissues are unremarkable. IMPRESSION: 1. No acute findings. 2. Osteoarthritis and osteopenia. Electronically Signed   By: Marlaine Hind M.D.   On: 10/28/2019 10:59   DG Hip Unilat W or Wo Pelvis 2-3 Views Left  Result Date: 10/28/2019 CLINICAL DATA:  Left-sided weakness status post fall EXAM: DG HIP (WITH OR WITHOUT PELVIS) 2-3V LEFT COMPARISON:  None. FINDINGS: There is no evidence of hip fracture or dislocation. There is no evidence of arthropathy or other focal bone abnormality. There is generalized osteopenia. IMPRESSION: No acute osseous injury of the left. Given the patient's age and osteopenia, if there is persistent clinical concern for an occult hip fracture, a MRI of the hip is recommended for increased sensitivity. Electronically Signed   By: Kathreen Devoid   On: 10/28/2019 10:59   CT HEAD CODE STROKE WO CONTRAST  Result Date: 10/28/2019 CLINICAL DATA:  Code stroke.  Left-sided weakness EXAM: CT HEAD WITHOUT CONTRAST TECHNIQUE: Contiguous axial images were obtained from the base of the skull through the vertex without intravenous contrast. COMPARISON:  None. FINDINGS: Brain: There is no acute intracranial hemorrhage or mass effect. There is new hypoattenuation with loss of gray-white differentiation in the posterior right temporal lobe. Additional smaller area of loss of gray-white differentiation involving the parasagittal posterior right frontal lobe. There is a new 8 mm focus of hypoattenuation in the right centrum semiovale. Prominence of the ventricles and sulci reflects stable parenchymal volume loss. Additional patchy and confluent hypoattenuation in the supratentorial white matter is nonspecific but probably reflects stable chronic microvascular ischemic changes. Vascular:  No hyperdense vessel. Skull: Unremarkable Sinuses/Orbits: Mild mucosal thickening. Bilateral lens replacements. Other: Mastoid air cells are clear. ASPECTS [facility] Stroke Program Early CT Score) - Ganglionic level infarction (caudate, lentiform nuclei, internal capsule, insula, M1-M3 cortex): 6 - Supraganglionic infarction (M4-M6 cortex): 3 Total score (0-10 with 10 being normal): 10 IMPRESSION: No acute intracranial hemorrhage. Acute infarction involving the posterior right temporal lobe. Additional smaller acute infarction involving the parasagittal posterior right frontal lobe. New age-indeterminate subcentimeter infarct  of the right centrum semiovale. These results were communicated to Dr. Cheral Marker at 9:30 amon 5/31/2021by text page via the [facility] messaging system. Electronically Signed   By: Macy Mis M.D.   On: 10/28/2019 09:47    Procedures Procedures (including critical care time)  Medications Ordered in ED Medications   stroke: mapping our early stages of recovery book (has no administration in time range)  0.9 %  sodium chloride infusion (has no administration in time range)  acetaminophen (TYLENOL) tablet 650 mg (has no administration in time range)    Or  acetaminophen (TYLENOL) 160 MG/5ML solution 650 mg (has no administration in time range)    Or  acetaminophen (TYLENOL) suppository 650 mg (has no administration in time range)  aspirin EC tablet 81 mg (has no administration in time range)  clopidogrel (PLAVIX) tablet 75 mg (has no administration in time range)  irbesartan (AVAPRO) tablet 37.5 mg (has no administration in time range)  amitriptyline (ELAVIL) tablet 5 mg (has no administration in time range)  PARoxetine (PAXIL) tablet 20 mg (has no administration in time range)  dicyclomine (BENTYL) capsule 10 mg (has no administration in time range)  clonazePAM (KLONOPIN) disintegrating tablet 0.25 mg (has no administration in time range)  potassium chloride (KLOR-CON) CR tablet  10 mEq (has no administration in time range)  latanoprost (XALATAN) 0.005 % ophthalmic solution 1 drop (has no administration in time range)  sodium chloride flush (NS) 0.9 % injection 3 mL (3 mLs Intravenous Given 10/28/19 1009)  iohexol (OMNIPAQUE) 350 MG/ML injection 75 mL (75 mLs Intravenous Contrast Given 10/28/19 0955)    ED Course  I have reviewed the triage vital signs and the nursing notes.  Pertinent labs & imaging results that were available during my care of the patient were reviewed by me and considered in my medical decision making (see chart for details).    MDM Rules/Calculators/A&P                     Pt presented to the ED as a code stroke for left sided weakness.  Last known normal Thursday.  Pt not a TPA candidate due to duration of sxs.  CT demonstrates acute infarct.  Imaging neg for acute fracture.  Hospitalist consulted for admission.    Final Clinical Impression(s) / ED Diagnoses Final diagnoses:  Acute CVA (cerebrovascular accident) [facility])    Rx / [place] Orders ED Discharge Orders    None       Quintella Reichert, MD 10/28/19 1715

## 2019-10-28 NOTE — Evaluation (Signed)
Physical Therapy Evaluation Patient Details Name: Christy Waters MRN: [id number] DOB: August 03, 1918 Today's Date: 10/28/2019   History of Present Illness  Pt is a 86 y.o. female with hx of HTN, HLD, Afib not on anticoagulation due to hx of falls, OA, depression, who presented from home via EMS with L sided deficits and was found to have an acute posterior right temporal lobe and parasagittal posterior R frontal lobe.  Her symptoms began on Thursday when she fell.  L LE imaging negative for acute fx.  Clinical Impression  Pt admitted with R temporal and frontal lobe CVA.  Pt presents with L inattention, L weakness and increased tone, L decreased sensation and proprioception, and decreased mobility and balance. Pt was most limited today during evaluation by increased tone and decreased ROM in L LE.  Family supportive and can assist with HEP and addressig pt's L inattention/neglect. Pt was independent prior and has good 24 hr support at home.  Pt with good rehab potential and will benefit from therapy.  Pt good inpatient rehab candidate from therapist perspective.  Pt currently with functional limitations due to the deficits listed below (see PT Problem List). Pt will benefit from skilled PT to increase their independence and safety with mobility to allow discharge to the venue listed below.       Follow Up Recommendations Supervision/Assistance - 24 hour;CIR    Equipment Recommendations  Wheelchair cushion (measurements PT);Wheelchair (measurements PT);Hospital bed;Other (comment)(to be further assessed)    Recommendations for Other Services Rehab consult     Precautions / Restrictions Precautions Precautions: Fall      Mobility  Bed Mobility Overal bed mobility: Needs Assistance Bed Mobility: Supine to Sit;Sit to Supine     Supine to sit: Mod assist Sit to supine: Mod assist   General bed mobility comments: cues for sequencing ; assist for L LE and to boost trunk; assist to  scoot to EOB  Transfers Overall transfer level: Needs assistance   Transfers: Sit to/from Stand Sit to Stand: Max assist         General transfer comment: Attempted with RW and with therapist holding pt.  Unable with RW due to L leg extending and therapist unable to block.  Even with therapist using gait belt, attempting to block L leg in flexed position (difficult to block), and pt holding therapist still required max A to stand .  Ambulation/Gait             General Gait Details: unable  Stairs            Wheelchair Mobility    Modified Rankin (Stroke Patients Only) Modified Rankin (Stroke Patients Only) Pre-Morbid Rankin Score: Slight disability Modified Rankin: Severe disability     Balance Overall balance assessment: Needs assistance Sitting-balance support: No upper extremity supported Sitting balance-Leahy Scale: Good Sitting balance - Comments: Once positioned EOB , pt was able to maintain sittinb balnce without support     Standing balance-Leahy Scale: Zero                               Pertinent Vitals/Pain Pain Assessment: Faces Faces Pain Scale: Hurts little more Pain Location: L leg with movement/stretch Pain Descriptors / Indicators: Grimacing Pain Intervention(s): Limited activity within patient's tolerance;Monitored during session;Repositioned;Relaxation    Home Living Family/patient expects to be discharged to:: Private residence Living Arrangements: Children Available Help at Discharge: Family;Available 24 hours/day;Personal care attendant Type of Home: House  Home Access: Stairs to enter Entrance Stairs-Rails: Left Entrance Stairs-Number of Steps: 3 Home Layout: One level Home Equipment: Walker - 4 wheels;Shower seat;Bedside commode;Transport chair      Prior Function Level of Independence: Independent         Comments: Pt was independent with ambulation using rollator and reports could do community distances.   She was independent for ADLs and some IADLs.  Has caregiver aides that can assist if needed.  Since her fall on Thursday has had declining mobility.     Hand Dominance        Extremity/Trunk Assessment   Upper Extremity Assessment Upper Extremity Assessment: RUE deficits/detail;LUE deficits/detail RUE Deficits / Details: WFL LUE Deficits / Details: ROM WFL; MMT grossly 0-1/5; did not note increased tone LUE Sensation: decreased light touch;decreased proprioception LUE Coordination: decreased fine motor    Lower Extremity Assessment Lower Extremity Assessment: LLE deficits/detail;RLE deficits/detail RLE Deficits / Details: WFL LLE Deficits / Details: L LE with increased tone.  L ankle: unable to achieve neutral dorsiflexion and tends to supinate.  L knee: tends to keep full extended and with stretching can achieve ~60 flexion but unable to maintain.  Hip WFL.  MMT: Pt unable to activate L ankle of knee ROM, hip 2/5 LLE Sensation: decreased light touch;decreased proprioception LLE Coordination: decreased gross motor    Cervical / Trunk Assessment Cervical / Trunk Assessment: Kyphotic;Other exceptions Cervical / Trunk Exceptions: Limited neck ROM : unable to L lateral flex, forward head, tends to look R  Communication   Communication: No difficulties  Cognition Arousal/Alertness: Awake/alert Behavior During Therapy: (emotional at times over CVA) Overall Cognitive Status: Within Functional Limits for tasks assessed Area of Impairment: Orientation;Problem solving;Attention                 Orientation Level: Time           Problem Solving: Slow processing;Difficulty sequencing;Decreased initiation;Requires tactile cues;Requires verbal cues General Comments: Pt with L inattention but could attend to L with cues      General Comments General comments (skin integrity, edema, etc.):  Other neuro symptoms include:  L inattention/neglect (pt was able to identify hand, but  tends to look R, could look L and find items with cues)  Decreased vision on L vs inattention (difficulty identifing # of fingers on L)  EOEM: difficutly tracking to L   Education:  Spent increased time educating pt and family on PT role and findings and recommenations. Discussed recommending inpt rehab.  Also, educated on and encourage family to assist with ROM, positioning to prevent L UE contractures, stretching L LE as able, and talking to pt on L side.    Exercises Other Exercises Other Exercises: L PF stretch and knee flex stretch 30 sec x 3   Assessment/Plan    PT Assessment Patient needs continued PT services  PT Problem List Decreased strength;Decreased mobility;Impaired tone;Decreased range of motion;Decreased coordination;Decreased activity tolerance;Decreased cognition;Decreased balance;Decreased knowledge of use of DME;Impaired sensation       PT Treatment Interventions DME instruction;Therapeutic activities;Gait training;Therapeutic exercise;Patient/family education;Balance training;Functional mobility training;Neuromuscular re-education;Manual techniques;Wheelchair mobility training    PT Goals (Current goals can be found in the Care Plan section)  Acute Rehab PT Goals Patient Stated Goal: return to walking PT Goal Formulation: With patient/family Time For Goal Achievement: 11/11/19 Potential to Achieve Goals: Good Additional Goals Additional Goal #1: Pt with achieve 90 degrees knee ROM and neutral dorsiflexion on L for standing    Frequency Min 4X/week  Barriers to discharge        Co-evaluation               AM-PAC PT "6 Clicks" Mobility  Outcome Measure Help needed turning from your back to your side while in a flat bed without using bedrails?: A Lot Help needed moving from lying on your back to sitting on the side of a flat bed without using bedrails?: A Lot Help needed moving to and from a bed to a chair (including a wheelchair)?: Total Help  needed standing up from a chair using your arms (e.g., wheelchair or bedside chair)?: Total Help needed to walk in hospital room?: Total Help needed climbing 3-5 steps with a railing? : Total 6 Click Score: 8    End of Session Equipment Utilized During Treatment: Gait belt Activity Tolerance: Patient tolerated treatment well Patient left: in bed;with call bell/phone within reach;with family/visitor present(no alarm as family present and sitting/leaning on bed at times) Nurse Communication: Mobility status;Precautions;Other (comment)(white board communication : assist of 2, address on L side.  Recommenation for [facility]) PT Visit Diagnosis: Other abnormalities of gait and mobility (R26.89);Muscle weakness (generalized) (M62.81);Other symptoms and signs involving the nervous system (R29.898)    Time: [id number] PT Time Calculation (min) (ACUTE ONLY): 35 min   Charges:   PT Evaluation $PT Eval Moderate Complexity: 1 Mod PT Treatments $Therapeutic Activity: 8-22 mins        Maggie Font, PT Acute Rehab Services Pager [phone] [facility] Rehab [phone] [facility] [phone]   Karlton Lemon 10/28/2019, 5:41 PM

## 2019-10-28 NOTE — ED Triage Notes (Addendum)
Patient arrives via [facility] due to having worsening weakness and suspected stroke symptoms. Per ems, the patient recently had a fall on Thursday 5/27, and that was her suspected LKW. Since then, the family/cargivers stated that the patient has developed worsening mobility on her left side. Patient was unable to mover her left arm and leg starting on Saturday or Sunday. A different caregiver noted that this morning that she was unable to move her left arm or left leg.   Patient arrives alert and oriented x3, c/o of headache. Bilateral 18g IV's .

## 2019-10-28 NOTE — ED Notes (Signed)
purewick minimal output (50cc), noted wet on pad, changed.

## 2019-10-28 NOTE — Procedures (Signed)
A recent echo has been done on this patient, please contact echo department if repeat is necessary.

## 2019-10-28 NOTE — Progress Notes (Signed)
Attempted to call back daughter  Lacinda Axon Daughter [phone]

## 2019-10-28 NOTE — ED Notes (Signed)
Pt to CT

## 2019-10-28 NOTE — ED Notes (Signed)
Glenda, pt's caregiver [phone]

## 2019-10-29 ENCOUNTER — Inpatient Hospital Stay (HOSPITAL_COMMUNITY): Payer: Medicare Other

## 2019-10-29 DIAGNOSIS — I639 Cerebral infarction, unspecified: Secondary | ICD-10-CM

## 2019-10-29 DIAGNOSIS — E782 Mixed hyperlipidemia: Secondary | ICD-10-CM

## 2019-10-29 DIAGNOSIS — I340 Nonrheumatic mitral (valve) insufficiency: Secondary | ICD-10-CM

## 2019-10-29 DIAGNOSIS — I361 Nonrheumatic tricuspid (valve) insufficiency: Secondary | ICD-10-CM

## 2019-10-29 DIAGNOSIS — I63511 Cerebral infarction due to unspecified occlusion or stenosis of right middle cerebral artery: Principal | ICD-10-CM

## 2019-10-29 LAB — CBC
HCT: 34.1 % — ABNORMAL LOW (ref 36.0–46.0)
Hemoglobin: 11.1 g/dL — ABNORMAL LOW (ref 12.0–15.0)
MCH: 32 pg (ref 26.0–34.0)
MCHC: 32.6 g/dL (ref 30.0–36.0)
MCV: 98.3 fL (ref 80.0–100.0)
Platelets: 201 10*3/uL (ref 150–400)
RBC: 3.47 MIL/uL — ABNORMAL LOW (ref 3.87–5.11)
RDW: 14.3 % (ref 11.5–15.5)
WBC: 4.7 10*3/uL (ref 4.0–10.5)
nRBC: 0 % (ref 0.0–0.2)

## 2019-10-29 LAB — ECHOCARDIOGRAM COMPLETE
Height: 66 in
Weight: 1915.36 oz

## 2019-10-29 LAB — COMPREHENSIVE METABOLIC PANEL
ALT: 16 U/L (ref 0–44)
AST: 22 U/L (ref 15–41)
Albumin: 3.3 g/dL — ABNORMAL LOW (ref 3.5–5.0)
Alkaline Phosphatase: 120 U/L (ref 38–126)
Anion gap: 8 (ref 5–15)
BUN: 11 mg/dL (ref 8–23)
CO2: 28 mmol/L (ref 22–32)
Calcium: 8.7 mg/dL — ABNORMAL LOW (ref 8.9–10.3)
Chloride: 106 mmol/L (ref 98–111)
Creatinine, Ser: 0.72 mg/dL (ref 0.44–1.00)
GFR calc Af Amer: >60 mL/min (ref >60)
GFR calc non Af Amer: >60 mL/min (ref >60)
Glucose, Bld: 106 mg/dL — ABNORMAL HIGH (ref 70–99)
Potassium: 3.1 mmol/L — ABNORMAL LOW (ref 3.5–5.1)
Sodium: 142 mmol/L (ref 135–145)
Total Bilirubin: 0.9 mg/dL (ref 0.3–1.2)
Total Protein: 6.5 g/dL (ref 6.5–8.1)

## 2019-10-29 MED ORDER — PAROXETINE HCL 20 MG PO TABS
20.0000 mg | ORAL_TABLET | Freq: Every morning | ORAL | Status: DC
  Administered 2019-10-29 – 2019-11-01 (×4): 20 mg via ORAL
  Filled 2019-10-29 (×4): qty 1

## 2019-10-29 MED ORDER — ASPIRIN EC 325 MG PO TBEC
325.0000 mg | DELAYED_RELEASE_TABLET | Freq: Every day | ORAL | Status: DC
  Administered 2019-10-29 – 2019-11-01 (×4): 325 mg via ORAL
  Filled 2019-10-29 (×4): qty 1

## 2019-10-29 MED ORDER — ONDANSETRON HCL 4 MG/2ML IJ SOLN
4.0000 mg | Freq: Once | INTRAMUSCULAR | Status: AC
  Administered 2019-10-29: 4 mg via INTRAVENOUS
  Filled 2019-10-29: qty 2

## 2019-10-29 MED ORDER — POTASSIUM CHLORIDE CRYS ER 10 MEQ PO TBCR
10.0000 meq | EXTENDED_RELEASE_TABLET | Freq: Every morning | ORAL | Status: DC
  Administered 2019-10-29 – 2019-11-01 (×4): 10 meq via ORAL
  Filled 2019-10-29 (×4): qty 1

## 2019-10-29 MED ORDER — POTASSIUM CHLORIDE CRYS ER 20 MEQ PO TBCR
40.0000 meq | EXTENDED_RELEASE_TABLET | Freq: Once | ORAL | Status: AC
  Administered 2019-10-29: 40 meq via ORAL
  Filled 2019-10-29: qty 4

## 2019-10-29 NOTE — Progress Notes (Signed)
STROKE TEAM PROGRESS NOTE   INTERVAL HISTORY Son at bedside. Pt lying in bed, lethargic, however, awake alert, answer questions appropriately and follows simple commands.  Still has left hemiplegia.  Vitals:   10/29/19 0511 10/29/19 0600 10/29/19 0800 10/29/19 1200  BP:   (!) 154/98 (!) 150/81  Pulse: (!) 58 66 62 82  Resp: 18 15 17 20   Temp:   98.1 F (36.7 C) 98 F (36.7 C)  TempSrc:   Oral Oral  SpO2: 97% 100% 98% 93%  Weight:      Height:        CBC:  Recent Labs  Lab 10/28/19 0918 10/28/19 0918 10/28/19 0924 10/29/19 0455  WBC 6.8  --   --  4.7  NEUTROABS 5.8  --   --   --   HGB 12.9   < > 12.2 11.1*  HCT 38.5   < > 36.0 34.1*  MCV 96.5  --   --  98.3  PLT 233  --   --  201   < > = values in this interval not displayed.    Basic Metabolic Panel:  Recent Labs  Lab 10/28/19 0918 10/28/19 0918 10/28/19 0924 10/29/19 0455  NA 142   < > 143 142  K 3.9   < > 4.0 3.1*  CL 106   < > 121* 106  CO2 26  --   --  28  GLUCOSE 136*   < > 137* 106*  BUN 9   < > 10 11  CREATININE 0.75   < > 0.80 0.72  CALCIUM 9.3  --   --  8.7*   < > = values in this interval not displayed.   Lipid Panel:     Component Value Date/Time   CHOL 379 (H) 10/28/2019 0918   TRIG 88 10/28/2019 0918   HDL 81 10/28/2019 0918   CHOLHDL 4.7 10/28/2019 0918   VLDL 18 10/28/2019 0918   LDLCALC 280 (H) 10/28/2019 0918   HgbA1c:  Lab Results  Component Value Date   HGBA1C 5.1 10/28/2019   Urine Drug Screen: No results found for: LABOPIA, COCAINSCRNUR, LABBENZ, AMPHETMU, THCU, LABBARB  Alcohol Level No results found for: ETH  IMAGING past 24 hours MR BRAIN WO CONTRAST  Result Date: 10/29/2019 CLINICAL DATA:  Stroke symptoms with worsening weakness EXAM: MRI HEAD WITHOUT CONTRAST TECHNIQUE: Multiplanar, multiecho pulse sequences of the brain and surrounding structures were obtained without intravenous contrast. COMPARISON:  Head CT and CTA earlier today FINDINGS: Brain: Scattered infarcts  in the right cerebrum along the MCA, distal ACA, and watershed territories. Largest confluent infarct is at the posterior right temporal to low parietal cortex and white matter measuring up to 3 cm. ACA territory involvement is along the central sulcus. No hemorrhage, hydrocephalus, or masslike finding. Chronic small vessel ischemia in the white matter and pons. There is a line of remote lacunar infarcts in the periventricular right frontal lobe, likely past deep watershed injury. Vascular: Normal flow voids Skull and upper cervical spine: Multilevel cervical ankylosis. No focal marrow lesion. Sinuses/Orbits: Bilateral cataract resection IMPRESSION: Multiple acute infarcts in the right MCA, distal ACA, and watershed territories. Electronically Signed   By: Monte Fantasia M.D.   On: 10/29/2019 04:24    PHYSICAL EXAM  Temp:  [97.6 F (36.4 C)-98.2 F (36.8 C)] 98.1 F (36.7 C) (06/01 1630) Pulse Rate:  [51-91] 73 (06/01 1630) Resp:  [11-33] 20 (06/01 1630) BP: (109-180)/(70-105) 178/86 (06/01 1630) SpO2:  [92 %-100 %]  92 % (06/01 1630)  General - Well nourished, well developed, in no apparent distress, mildly lethargic.  Ophthalmologic - fundi not visualized due to noncooperation.  Cardiovascular - Regular rhythm and rate.  Neuro - awake alert orientated to people, place and age, however not oriented to time.  No aphasia, answer question appropriately, no dysarthria, able to name and repeat, follow simple commands.  Questionable left upper quadrantanopsia.  Left gaze incomplete bilaterally.  Left facial droop, left upper extremity proximal 0/5, distal 3/5 with finger grip.  Left lower extremity 1/5 proximal and distal.  Sensation symmetrical, finger-to-nose intact around right.  Gait not tested.   ASSESSMENT/PLAN Ms. Rudine Grantham is a 86 y.o. 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   Stroke:   R MCA and ACA territory infarcts embolic secondary to right M1 high-grade stenosis versus known AF not on AC  CT head R temporal lobe infarct. R frontal lobe infarct. R centrum semiovale infarct. ASPECTS 10.     CTA head & neck no LVO. Multifocal intracranial atherosclerosis: moderate to severe mid R M1 and R A2/3  MRI  Multiple acute infarcts R MCA, distal ACA and watershed territories  2D Echo pending  LDL 280  HgbA1c 5.1  SCDs for VTE prophylaxis  No antithrombotic prior to admission, now on aspirin 325 mg daily and clopidogrel 75 mg daily.  Continue DAPT on discharge.  Follow-up with cardiology for further antithrombotic regimen  Therapy recommendations:  CIR  Disposition:  pending   Atrial Fibrillation  05/2017 follow-up with cardiology in [facility] - continued Eliquis 2.5 bid d/t age/wt.   05/2019 followed with cardiology in [place] clinic for bradycardia tachycardia, was not on Eliquis at that time.  Did not mention A. fib in clinical note.  No pacer needed.  As per son, pt off eliquis due to frequent episode of syncope and fall  Currently on DAPT  Pt needs to continue follow up with cardiology Dr. Saralyn Pilar to decide on further antithrombotic regimen   Intracranial stenosis  CTA head and neck - multifocal atherosclerosis including moderate to severe stenosis of the mid right M1 MCA and right A2/A3 ACA.  On DAPT  Hypertension  Stable . Permissive hypertension (OK if < 220/120) but gradually normalize in 3-5 days . Long-term BP goal normotensive  Hyperlipidemia  Home meds:  No statin  Now on lipitor 80  LDL 280, goal < 70  Continue statin at discharge  Other Stroke Risk Factors  Advanced age  Hx LA mass in setting of AF, treated with Eliquis  Other Active Problems  Depression on SSRI and amitriptyline  hypokalemia   Hospital day # 1  Neurology will sign off. Please call with  questions. Pt will follow up with Dr. Sunday Corn at [place] [facility] in 4 weeks after discharge. Thanks for the consult.  Rosalin Hawking, MD PhD Stroke Neurology 10/29/2019 5:21 PM    To contact Stroke Continuity provider, please refer to [email or web address] After hours, contact General Neurology

## 2019-10-29 NOTE — Progress Notes (Signed)
  Echocardiogram 2D Echocardiogram has been performed.  Anh Bigos G Aileene Lanum 10/29/2019, 3:09 PM

## 2019-10-29 NOTE — Evaluation (Signed)
Speech Language Pathology Evaluation Patient Details Name: Christy Waters MRN: [id number] DOB: Mar 13, 1919 Today's Date: 10/29/2019 Time:  - 9:25-9:50    Problem List:  Patient Active Problem List   Diagnosis Date Noted  . Stroke ([place]) 10/28/2019  . Hypertensive urgency 09/16/2019  . Syncope, vasovagal 09/15/2019  . Syncope and collapse 09/15/2019  . Acute lower UTI 09/15/2019  . Pelvic pain in female 03/20/2018  . Cor pulmonale ([place]) 09/19/2017  . Low serum vitamin D 06/19/2017  . History of atrial fibrillation 06/19/2017  . Chest pain 05/10/2017  . Moderate episode of recurrent major depressive disorder ([place]) 04/04/2017  . Adult idiopathic generalized osteoporosis 04/13/2016  . 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 Irritable bowel syndrome with diarrhea 05/20/2014  . Abdominal pain, generalized 04/01/2014  . Atherosclerosis of aorta ([place]) 03/05/2014  . 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 Idiopathic peripheral neuropathy 03/05/2014  . Abdominal pain of unknown cause 02/06/2014  . MI (mitral incompetence) 02/06/2014  . Stress incontinence 02/06/2014  . Fibrosis of lung ([place]) 10/03/2013  . Generalized OA 10/03/2013  . HLD (hyperlipidemia) 10/03/2013  . BP (high blood pressure) 10/03/2013  . OP (osteoporosis) 10/03/2013  . Beat, premature ventricular 10/03/2013  . Ventricular premature depolarization 10/03/2013   Past Medical History:  Past Medical History:  Diagnosis Date  . Anemia    distant past  . Arthritis    fingers, hips  . Cancer ([place])    skin cancer; some spots on left side of face; been 5 years since last treated;   . Headache   . History of echocardiogram    a. TTE 04/06/17: EF > 55%, no RWMA, no LA mass, severe TR, RV systolic dysfunction, elevated PASP; b. TTE 04/2017: EF 65-70%, no RWMA, Gr1DD, calcified mitral annulus w/ mild MR, mild to mod dilated LA w/ irregular, echodense material in the LA incompletely visualized, possibly representing artifact  vs thrombus, or intracardiac mass, rec TEE, mod TR, mildly dilated RV, inc PASP  . History of nuclear stress test    a. 12/18: no sig ischemia, low risk  . HLD (hyperlipidemia)   . Left atrial mass    a. see echo from 04/2017 - ? mass vs LA thrombus in the setting of PAF -->Eliquis.  . Osteoporosis   . PAF (paroxysmal atrial fibrillation) ([place])    a. diagnosed 05/10/2017; b. CHADS2VASc --> 3 (age x 2, female)--> Eliquis 2.5 (age/wt).   Past Surgical History:  Past Surgical History:  Procedure Laterality Date  . CATARACT EXTRACTION W/PHACO Left 09/14/2015   Procedure: CATARACT EXTRACTION PHACO AND INTRAOCULAR LENS PLACEMENT (IOC);  Surgeon: Ronnell Freshwater, MD;  Location: [facility];  Service: Ophthalmology;  Laterality: Left;  MALYUGIN  . EYE SURGERY Right    cataract surgery schedule for Easter Monday 2017;    HPI:  Pt is a 86 y.o. female with hx of HTN, HLD, Afib not on anticoagulation due to hx of falls, OA, depression, who presented from home via EMS with L sided deficits and was found to have an acute posterior right temporal lobe and parasagittal posterior R frontal lobe.  Her symptoms began on Thursday when she fell.  L LE imaging negative for acute fx.   Assessment / Plan / Recommendation Clinical Impression  Pt demonstrates cognitive deficits increased from baseline. Pt has mild dementia; has 24 hour care givers but was previously independent with basic self care. Pt now has moderate left inattention and visal field deficits. She can verbally state some basic awareness  of her situation, but does not recognize hemiparesis or its impact on her function. Speech and language are intact. Pt will need increased assistance physically and cognitively for safety awareness, sequencing, sweep gaze left, using strategies to compensate for vision. Introduced strategies to caregiver, but further education may be warranted. Will f/u acutely as needed. Pt may benefit from inpatient  rehab.     SLP Assessment  SLP Recommendation/Assessment: Patient needs continued Speech Lanaguage Pathology Services SLP Visit Diagnosis: Cognitive communication deficit (R41.841)    Follow Up Recommendations  Inpatient Rehab    Frequency and Duration min 1 x/week  2 weeks      SLP Evaluation Cognition  Overall Cognitive Status: Impaired/Different from baseline Arousal/Alertness: Awake/alert Orientation Level: Oriented X4 Attention: Sustained Sustained Attention: Appears intact Memory: Impaired Memory Impairment: Decreased long term memory;Decreased short term memory Decreased Long Term Memory: Verbal basic Decreased Short Term Memory: Functional basic Awareness: Impaired Awareness Impairment: Emergent impairment;Intellectual impairment Problem Solving: Impaired Problem Solving Impairment: Functional basic Executive Function: Sequencing Safety/Judgment: Impaired       Comprehension  Auditory Comprehension Overall Auditory Comprehension: Appears within functional limits for tasks assessed Reading Comprehension Reading Status: Impaired Word level: Impaired Interfering Components: Left neglect/inattention;Visual acuity    Expression Verbal Expression Overall Verbal Expression: Appears within functional limits for tasks assessed Written Expression Dominant Hand: Right   Oral / Motor  Oral Motor/Sensory Function Overall Oral Motor/Sensory Function: Within functional limits Motor Speech Overall Motor Speech: Appears within functional limits for tasks assessed   GO                    Leanore Biggers, Katherene Ponto 10/29/2019, 10:18 AM

## 2019-10-29 NOTE — Progress Notes (Signed)
PROGRESS NOTE    Christy Waters  [id number] DOB: Dec 05, 1918 DOA: 10/28/2019 PCP: Rusty Aus, MD    Brief Narrative:  86 year old female with history of hypertension, hyperlipidemia, chronic A. fib not on anticoagulation due to history of falls, osteoarthritis who lives at home with 24 hours rotating caregivers presented to the hospital with left-sided hemiplegia and found to have acute stroke on imaging.  She probably had symptoms ongoing for last few days as she was noted to be weak on the left side and fell last week but declined to come to the hospital. In the emergency room left hemiplegia, CT scan and MRI consistent with right ACA and MCA stroke.   Assessment & Plan:   Active Problems:   HLD (hyperlipidemia)   BP (high blood pressure)   MI (mitral incompetence)   OP (osteoporosis)   Moderate episode of recurrent major depressive disorder (HCC)   History of atrial fibrillation   Stroke ([place])  Acute ischemic stroke, right ACA and MCA stroke: Clinical findings, unable to move left side of the body including left arm and left leg. CT head findings, acute infarction posterior right temporal lobe, smaller acute infarction on the parasagittal posterior right frontal lobe. MRI of the brain, multiple acute infarcts in the right MCA, distal ACA and watershed territories. CTA of the head and neck, no large vessel occlusion.  Atherosclerotic disease and intracranial stenosis. 2D echocardiogram, done, results pending. Antiplatelets, does have history of A. fib, not on anticoagulation due to fall risk and bleeding risk.  Neurology recommended dual antiplatelet therapy.  Started. LDL, 280.  He started on high intensity statin.  She is 86 year old, hopefully statin will help. Hemoglobin A1c, 5.1.  No indication for treatment. Therapy recommendations, acute inpatient rehab.  Essential hypertension: We will resume home medications.  She is about 4 to 5 days out of acute  stroke.  Paroxysmal atrial fibrillation: Currently in sinus rhythm.  Unable to anticoagulate because of fall risk, side effects outweigh benefits.  Depression: On SSRI and amitriptyline that she will continue.  Hypokalemia: Replace and monitor levels.  DVT prophylaxis: SCD Code Status: DNR Family Communication: None.  Will communicate with family Disposition Plan: Status is: Inpatient  Remains inpatient appropriate because:Inpatient level of care appropriate due to severity of illness   Dispo: The patient is from: Home              Anticipated d/c is to: [facility]              Anticipated d/c date is: 2 days              Patient currently is not medically stable to d/c.          Consultants:   Neurology  Procedures:   None  Antimicrobials:   None   Subjective: Patient seen and examined.  No overnight events.  She is pleasant.  She denied any complaints.  Unable to move left side.  She understands she had a stroke "I still do not believe that I have a stroke"  Objective: Vitals:   10/29/19 0500 10/29/19 0511 10/29/19 0600 10/29/19 0800  BP:    (!) 154/98  Pulse: (!) 57 (!) 58 66 62  Resp: (!) 24 18 15 17   Temp:    98.1 F (36.7 C)  TempSrc:    Oral  SpO2: 96% 97% 100% 98%  Weight:      Height:        Intake/Output Summary (Last 24 hours)  at 10/29/2019 0954 Last data filed at 10/29/2019 0600 Gross per 24 hour  Intake 1041.98 ml  Output 475 ml  Net 566.98 ml   Filed Weights   10/28/19 0918  Weight: 54.3 kg    Examination:  General exam: Appears calm and comfortable , appropriate for age. Respiratory system: Clear to auscultation. Respiratory effort normal. Cardiovascular system: S1 & S2 heard, RRR. No JVD, murmurs, rubs, gallops or clicks. No pedal edema. Gastrointestinal system: Abdomen is nondistended, soft and nontender. No organomegaly or masses felt. Normal bowel sounds heard. Central nervous system: Alert and oriented.  No cranial nerve  deficits. Right upper and lower extremity normal power. Left upper and lower extremity power 2/5, upper extremity weaker than lower extremity. Skin: No rashes, lesions or ulcers Psychiatry: Judgement and insight appear normal. Mood & affect appropriate.     Data Reviewed: I have personally reviewed following labs and imaging studies  CBC: Recent Labs  Lab 10/28/19 0918 10/28/19 0924 10/29/19 0455  WBC 6.8  --  4.7  NEUTROABS 5.8  --   --   HGB 12.9 12.2 11.1*  HCT 38.5 36.0 34.1*  MCV 96.5  --  98.3  PLT 233  --  123456   Basic Metabolic Panel: Recent Labs  Lab 10/28/19 0918 10/28/19 0924 10/29/19 0455  NA 142 143 142  K 3.9 4.0 3.1*  CL 106 121* 106  CO2 26  --  28  GLUCOSE 136* 137* 106*  BUN 9 10 11   CREATININE 0.75 0.80 0.72  CALCIUM 9.3  --  8.7*   GFR: Estimated Creatinine Clearance: 33.7 mL/min (by C-G formula based on SCr of 0.72 mg/dL). Liver Function Tests: Recent Labs  Lab 10/28/19 0918 10/29/19 0455  AST 26 22  ALT 21 16  ALKPHOS 148* 120  BILITOT 0.9 0.9  PROT 7.4 6.5  ALBUMIN 3.8 3.3*   No results for input(s): LIPASE, AMYLASE in the last 168 hours. No results for input(s): AMMONIA in the last 168 hours. Coagulation Profile: Recent Labs  Lab 10/28/19 0918  INR 1.0   Cardiac Enzymes: No results for input(s): CKTOTAL, CKMB, CKMBINDEX, TROPONINI in the last 168 hours. BNP (last 3 results) No results for input(s): PROBNP in the last 8760 hours. HbA1C: Recent Labs    10/28/19 0918  HGBA1C 5.1   CBG: Recent Labs  Lab 10/28/19 0915  GLUCAP 126*   Lipid Profile: Recent Labs    10/28/19 0918  CHOL 379*  HDL 81  LDLCALC 280*  TRIG 88  CHOLHDL 4.7   Thyroid Function Tests: No results for input(s): TSH, T4TOTAL, FREET4, T3FREE, THYROIDAB in the last 72 hours. Anemia Panel: No results for input(s): VITAMINB12, FOLATE, FERRITIN, TIBC, IRON, RETICCTPCT in the last 72 hours. Sepsis Labs: No results for input(s): PROCALCITON,  LATICACIDVEN in the last 168 hours.  Recent Results (from the past 240 hour(s))  SARS Coronavirus 2 by RT PCR (hospital order, performed in [facility] hospital lab) Nasopharyngeal Nasopharyngeal Swab     Status: None   Collection Time: 10/28/19 11:58 AM   Specimen: Nasopharyngeal Swab  Result Value Ref Range Status   SARS Coronavirus 2 NEGATIVE NEGATIVE Final    Comment: (NOTE) SARS-CoV-2 target nucleic acids are NOT DETECTED. The SARS-CoV-2 RNA is generally detectable in upper and lower respiratory specimens during the acute phase of infection. The lowest concentration of SARS-CoV-2 viral copies this assay can detect is 250 copies / mL. A negative result does not preclude SARS-CoV-2 infection and should not be  used as the sole basis for treatment or other patient management decisions.  A negative result may occur with improper specimen collection / handling, submission of specimen other than nasopharyngeal swab, presence of viral mutation(s) within the areas targeted by this assay, and inadequate number of viral copies (<250 copies / mL). A negative result must be combined with clinical observations, patient history, and epidemiological information. Fact Sheet for Patients:   StrictlyIdeas.no Fact Sheet for Healthcare Providers: BankingDealers.co.za This test is not yet approved or cleared  by the [place] FDA and has been authorized for detection and/or diagnosis of SARS-CoV-2 by FDA under an Emergency Use Authorization (EUA).  This EUA will remain in effect (meaning this test can be used) for the duration of the COVID-19 declaration under Section 564(b)(1) of the Act, 21 U.S.C. section 360bbb-3(b)(1), unless the authorization is terminated or revoked sooner. Performed at [facility], [place] [address] [place], [place] [address]          Radiology Studies: CT Code Stroke CTA Head W/WO contrast  Result Date:  10/28/2019 CLINICAL DATA:  Left-sided weakness EXAM: CT ANGIOGRAPHY HEAD AND NECK TECHNIQUE: Multidetector CT imaging of the head and neck was performed using the standard protocol during bolus administration of intravenous contrast. Multiplanar CT image reconstructions and MIPs were obtained to evaluate the vascular anatomy. Carotid stenosis measurements (when applicable) are obtained utilizing NASCET criteria, using the distal internal carotid diameter as the denominator. CONTRAST:  5mL OMNIPAQUE IOHEXOL 350 MG/ML SOLN COMPARISON:  None. FINDINGS: CTA NECK Aortic arch: Calcified and noncalcified plaque along the visualized thoracic aorta. Great vessel origins are patent. Right carotid system: Patent. There is calcified plaque at the ICA origin causing less than 50% stenosis. Left carotid system: Patent. Minimal calcified plaque at the ICA origin without measurable stenosis. Vertebral arteries: Patent and codominant. Skeleton: Cervical spine dictated separately. Other neck: No mass or adenopathy. Upper chest: No apical lung mass. Review of the MIP images confirms the above findings CTA HEAD Anterior circulation: Intracranial internal carotid arteries are patent. Right middle cerebral artery is patent with moderate to severe stenosis of the mid M1 segment. Mild stenoses are present more distally. Left middle cerebral artery is patent. There is mild stenosis of the proximal left M1 segment. Additional mild stenosis are present more distally. Anterior cerebral arteries are patent. There is diffuse atherosclerotic irregularity with multifocal stenoses particularly of bilateral A2 and A3 segments, some of which are marked. Posterior circulation: Intracranial vertebral arteries are patent. Basilar artery is patent. Posterior cerebral arteries are patent with atherosclerotic irregularity particularly of bilateral distal P2 and P3 segments with areas of moderate and severe stenosis. Venous sinuses: Patent as allowed by  contrast bolus timing. Review of the MIP images confirms the above findings IMPRESSION: No large vessel occlusion or hemodynamically significant stenosis in the neck. Patent intracranial circulation with multifocal atherosclerosis including moderate to severe stenosis of the mid right M1 MCA and right A2/A3 ACA. Electronically Signed   By: Macy Mis M.D.   On: 10/28/2019 10:21   DG Chest 1 View  Result Date: 10/28/2019 CLINICAL DATA:  Left-sided weakness.  Fall. EXAM: CHEST  1 VIEW COMPARISON:  09/15/2019 FINDINGS: Heart size is mildly enlarged but unchanged. Coarse lung markings are suggestive for chronic changes. No focal airspace disease. Negative for a pneumothorax. No gross abnormality to the osseous structures. IMPRESSION: No acute chest abnormality. Electronically Signed   By: Markus Daft M.D.   On: 10/28/2019 10:57   DG Ankle  Complete Left  Result Date: 10/28/2019 CLINICAL DATA:  Fall 4 days ago. Left ankle pain and decreased mobility. EXAM: LEFT ANKLE COMPLETE - 3+ VIEW COMPARISON:  None. FINDINGS: There is no evidence of fracture, dislocation, or joint effusion. There is no evidence of arthropathy or other focal bone abnormality. Generalized osteopenia and mild peripheral vascular calcification noted. IMPRESSION: No acute findings. Electronically Signed   By: Marlaine Hind M.D.   On: 10/28/2019 11:00   CT Code Stroke CTA Neck W/WO contrast  Result Date: 10/28/2019 CLINICAL DATA:  Left-sided weakness EXAM: CT ANGIOGRAPHY HEAD AND NECK TECHNIQUE: Multidetector CT imaging of the head and neck was performed using the standard protocol during bolus administration of intravenous contrast. Multiplanar CT image reconstructions and MIPs were obtained to evaluate the vascular anatomy. Carotid stenosis measurements (when applicable) are obtained utilizing NASCET criteria, using the distal internal carotid diameter as the denominator. CONTRAST:  54mL OMNIPAQUE IOHEXOL 350 MG/ML SOLN COMPARISON:  None.  FINDINGS: CTA NECK Aortic arch: Calcified and noncalcified plaque along the visualized thoracic aorta. Great vessel origins are patent. Right carotid system: Patent. There is calcified plaque at the ICA origin causing less than 50% stenosis. Left carotid system: Patent. Minimal calcified plaque at the ICA origin without measurable stenosis. Vertebral arteries: Patent and codominant. Skeleton: Cervical spine dictated separately. Other neck: No mass or adenopathy. Upper chest: No apical lung mass. Review of the MIP images confirms the above findings CTA HEAD Anterior circulation: Intracranial internal carotid arteries are patent. Right middle cerebral artery is patent with moderate to severe stenosis of the mid M1 segment. Mild stenoses are present more distally. Left middle cerebral artery is patent. There is mild stenosis of the proximal left M1 segment. Additional mild stenosis are present more distally. Anterior cerebral arteries are patent. There is diffuse atherosclerotic irregularity with multifocal stenoses particularly of bilateral A2 and A3 segments, some of which are marked. Posterior circulation: Intracranial vertebral arteries are patent. Basilar artery is patent. Posterior cerebral arteries are patent with atherosclerotic irregularity particularly of bilateral distal P2 and P3 segments with areas of moderate and severe stenosis. Venous sinuses: Patent as allowed by contrast bolus timing. Review of the MIP images confirms the above findings IMPRESSION: No large vessel occlusion or hemodynamically significant stenosis in the neck. Patent intracranial circulation with multifocal atherosclerosis including moderate to severe stenosis of the mid right M1 MCA and right A2/A3 ACA. Electronically Signed   By: Macy Mis M.D.   On: 10/28/2019 10:21   MR BRAIN WO CONTRAST  Result Date: 10/29/2019 CLINICAL DATA:  Stroke symptoms with worsening weakness EXAM: MRI HEAD WITHOUT CONTRAST TECHNIQUE: Multiplanar,  multiecho pulse sequences of the brain and surrounding structures were obtained without intravenous contrast. COMPARISON:  Head CT and CTA earlier today FINDINGS: Brain: Scattered infarcts in the right cerebrum along the MCA, distal ACA, and watershed territories. Largest confluent infarct is at the posterior right temporal to low parietal cortex and white matter measuring up to 3 cm. ACA territory involvement is along the central sulcus. No hemorrhage, hydrocephalus, or masslike finding. Chronic small vessel ischemia in the white matter and pons. There is a line of remote lacunar infarcts in the periventricular right frontal lobe, likely past deep watershed injury. Vascular: Normal flow voids Skull and upper cervical spine: Multilevel cervical ankylosis. No focal marrow lesion. Sinuses/Orbits: Bilateral cataract resection IMPRESSION: Multiple acute infarcts in the right MCA, distal ACA, and watershed territories. Electronically Signed   By: Monte Fantasia M.D.   On: 10/29/2019  04:24   CT C-SPINE NO CHARGE  Result Date: 10/28/2019 CLINICAL DATA:  Trauma, left-sided weakness EXAM: CT CERVICAL SPINE WITHOUT CONTRAST TECHNIQUE: CT of the cervical spine was reconstructed from same day CTA of the neck performed with intravenous contrast. No additional contrast was administered. Axial, coronal, and sagittal reformats were obtained COMPARISON:  None. FINDINGS: Alignment: No significant listhesis. Skull base and vertebrae: No acute cervical spine fracture. There is partial fusion across the C3-C4 and C4-C5 disc spaces. There is fusion of several facets. Soft tissues and spinal canal: No prevertebral fluid or swelling. No visible canal hematoma. Disc levels: Multilevel degenerative changes are present including disc space narrowing, endplate osteophytes, and facet and uncovertebral hypertrophy. There is no high-grade osseous encroachment on the spinal canal. Upper chest: No apical lung mass. Other: None. IMPRESSION: No  acute cervical spine fracture. Electronically Signed   By: Macy Mis M.D.   On: 10/28/2019 11:11   DG Foot Complete Left  Result Date: 10/28/2019 CLINICAL DATA:  Fall 4 days ago. Left foot pain and decreased mobility. Initial encounter. EXAM: LEFT FOOT - COMPLETE 3+ VIEW COMPARISON:  None. FINDINGS: There is no evidence of fracture or dislocation. Mild osteoarthritis is seen involving the interphalangeal joints of the phalanges. Moderate to severe osteoarthritis is seen involving the intertarsal joints and tarsal-metatarsal joints of the midfoot. Small plantar calcaneal bone spur noted. Generalized osteopenia is noted, but no focal lytic or sclerotic bone lesions are identified. Soft tissues are unremarkable. IMPRESSION: 1. No acute findings. 2. Osteoarthritis and osteopenia. Electronically Signed   By: Marlaine Hind M.D.   On: 10/28/2019 10:59   DG Hip Unilat W or Wo Pelvis 2-3 Views Left  Result Date: 10/28/2019 CLINICAL DATA:  Left-sided weakness status post fall EXAM: DG HIP (WITH OR WITHOUT PELVIS) 2-3V LEFT COMPARISON:  None. FINDINGS: There is no evidence of hip fracture or dislocation. There is no evidence of arthropathy or other focal bone abnormality. There is generalized osteopenia. IMPRESSION: No acute osseous injury of the left. Given the patient's age and osteopenia, if there is persistent clinical concern for an occult hip fracture, a MRI of the hip is recommended for increased sensitivity. Electronically Signed   By: Kathreen Devoid   On: 10/28/2019 10:59   CT HEAD CODE STROKE WO CONTRAST  Result Date: 10/28/2019 CLINICAL DATA:  Code stroke.  Left-sided weakness EXAM: CT HEAD WITHOUT CONTRAST TECHNIQUE: Contiguous axial images were obtained from the base of the skull through the vertex without intravenous contrast. COMPARISON:  None. FINDINGS: Brain: There is no acute intracranial hemorrhage or mass effect. There is new hypoattenuation with loss of gray-white differentiation in the  posterior right temporal lobe. Additional smaller area of loss of gray-white differentiation involving the parasagittal posterior right frontal lobe. There is a new 8 mm focus of hypoattenuation in the right centrum semiovale. Prominence of the ventricles and sulci reflects stable parenchymal volume loss. Additional patchy and confluent hypoattenuation in the supratentorial white matter is nonspecific but probably reflects stable chronic microvascular ischemic changes. Vascular: No hyperdense vessel. Skull: Unremarkable Sinuses/Orbits: Mild mucosal thickening. Bilateral lens replacements. Other: Mastoid air cells are clear. ASPECTS [facility] Stroke Program Early CT Score) - Ganglionic level infarction (caudate, lentiform nuclei, internal capsule, insula, M1-M3 cortex): 6 - Supraganglionic infarction (M4-M6 cortex): 3 Total score (0-10 with 10 being normal): 10 IMPRESSION: No acute intracranial hemorrhage. Acute infarction involving the posterior right temporal lobe. Additional smaller acute infarction involving the parasagittal posterior right frontal lobe. New age-indeterminate subcentimeter infarct  of the right centrum semiovale. These results were communicated to Dr. Cheral Marker at 9:30 amon 5/31/2021by text page via the [facility] messaging system. Electronically Signed   By: Macy Mis M.D.   On: 10/28/2019 09:47        Scheduled Meds:   stroke: mapping our early stages of recovery book   Does not apply Once   amitriptyline  5 mg Oral QPM   aspirin EC  325 mg Oral Daily   atorvastatin  80 mg Oral Daily   clonazePAM  0.25 mg Oral QAC breakfast   clopidogrel  75 mg Oral Daily   irbesartan  37.5 mg Oral Daily   latanoprost  1 drop Both Eyes QHS   PARoxetine  20 mg Oral q AM   potassium chloride  10 mEq Oral q AM   potassium chloride  40 mEq Oral Once   Continuous Infusions:  sodium chloride 100 mL/hr at 10/29/19 0435     LOS: 1 day    Time spent: 30 minutes    Barb Merino,  MD [facility] Pager [phone]

## 2019-10-29 NOTE — Progress Notes (Signed)
Physical Therapy Treatment Patient Details Name: Christy Waters MRN: [id number] DOB: [birth year] Today's Date: 10/29/2019    History of Present Illness Pt is a 86 y.o. female with hx of HTN, HLD, Afib not on anticoagulation due to hx of falls, OA, depression, who presented from home via EMS with L sided deficits and was found to have an acute posterior right temporal lobe and parasagittal posterior R frontal lobe.  Her symptoms began on Thursday when she fell.  L LE imaging negative for acute fx.    PT Comments    Patient seen for mobility progression. Pt continues to present with L side weakness and inattention as well as visual deficits. Pt requires +2 assist for functional transfer/pre gait training. Continue to recommend CIR for further skilled PT services to maximize independence and safety with mobility.    Follow Up Recommendations  CIR     Equipment Recommendations  Wheelchair cushion (measurements PT);Wheelchair (measurements PT);Hospital bed;Other (comment)    Recommendations for Other Services Rehab consult     Precautions / Restrictions Precautions Precautions: Fall Precaution Comments: L inattention, L hemi Restrictions Weight Bearing Restrictions: No    Mobility  Bed Mobility Overal bed mobility: Needs Assistance Bed Mobility: Supine to Sit     Supine to sit: Mod assist     General bed mobility comments: assist for L LE, trunk and scooting forward; cueing for sequencing and technique with pt neglecting movement of L LE (crossing R LE over top of L LE)  Transfers Overall transfer level: Needs assistance Equipment used: 2 person hand held assist;Rolling walker (2 wheeled) Transfers: Sit to/from [employer] Sit to Stand: Mod assist;+2 physical assistance;+2 safety/equipment   Squat pivot transfers: Max assist;+2 physical assistance;+2 safety/equipment     General transfer comment: attempted with RW and unable to manage safety,  transitioned to 2 person HHA; requires mod assist +2 to power up and steady with max assist +2 to squat pivot to recliner given max cueing to sequence technique and full support on L side   Ambulation/Gait                 Stairs             Wheelchair Mobility    Modified Rankin (Stroke Patients Only)       Balance Overall balance assessment: Needs assistance Sitting-balance support: Single extremity supported;Feet supported Sitting balance-Leahy Scale: Fair Sitting balance - Comments: min assist to min guard, inital R lateral and posterior lean lean but with fatigue leaned to L sid; able to correct with mulitmodal cueing    Standing balance support: Bilateral upper extremity supported;During functional activity Standing balance-Leahy Scale: Zero Standing balance comment: relies on external support                            Cognition Arousal/Alertness: Awake/alert Behavior During Therapy: WFL for tasks assessed/performed Overall Cognitive Status: Impaired/Different from baseline Area of Impairment: Orientation;Problem solving;Attention;Following commands;Safety/judgement;Awareness                 Orientation Level: Disoriented to;Place(reports [facility] ) Current Attention Level: Sustained   Following Commands: Follows one step commands with increased time;Follows one step commands consistently;Follows multi-step commands inconsistently Safety/Judgement: Decreased awareness of safety;Decreased awareness of deficits Awareness: Emergent Problem Solving: Slow processing;Decreased initiation;Difficulty sequencing;Requires verbal cues;Requires tactile cues General Comments: Patient with decreased safety awareness, L inattention, slow processing and difficulty initating tasks. Continue cog assessments  Exercises Other Exercises Other Exercises: passive L LE ROM/stretch    General Comments General comments (skin integrity, edema, etc.):  difficulty with acheiving L foot flat due to PF tone       Pertinent Vitals/Pain Pain Assessment: Faces Faces Pain Scale: Hurts little more Pain Location: L leg with movement/stretch Pain Descriptors / Indicators: Grimacing Pain Intervention(s): Limited activity within patient's tolerance;Monitored during session;Repositioned    Home Living                      Prior Function            PT Goals (current goals can now be found in the care plan section) Acute Rehab PT Goals Patient Stated Goal: return to walking Progress towards PT goals: Progressing toward goals    Frequency    Min 4X/week      PT Plan Current plan remains appropriate    Co-evaluation PT/OT/SLP Co-Evaluation/Treatment: Yes Reason for Co-Treatment: For patient/therapist safety;To address functional/ADL transfers PT goals addressed during session: Mobility/safety with mobility;Balance        AM-PAC PT "6 Clicks" Mobility   Outcome Measure  Help needed turning from your back to your side while in a flat bed without using bedrails?: A Lot Help needed moving from lying on your back to sitting on the side of a flat bed without using bedrails?: A Lot Help needed moving to and from a bed to a chair (including a wheelchair)?: A Lot Help needed standing up from a chair using your arms (e.g., wheelchair or bedside chair)?: A Lot Help needed to walk in hospital room?: Total Help needed climbing 3-5 steps with a railing? : Total 6 Click Score: 10    End of Session Equipment Utilized During Treatment: Gait belt Activity Tolerance: Patient tolerated treatment well Patient left: in chair;with call bell/phone within reach;with chair alarm set;with family/visitor present Nurse Communication: Mobility status PT Visit Diagnosis: Other abnormalities of gait and mobility (R26.89);Muscle weakness (generalized) (M62.81);Other symptoms and signs involving the nervous system (R29.898)     Time: [id number] PT  Time Calculation (min) (ACUTE ONLY): 41 min  Charges:  $Gait Training: 8-22 mins $Therapeutic Activity: 8-22 mins                     Earney Navy, PTA Acute Rehabilitation Services Pager: [phone] Office: [phone]     Darliss Cheney 10/29/2019, 2:51 PM

## 2019-10-29 NOTE — Consult Note (Signed)
Physical Medicine and Rehabilitation Consult Reason for Consult: Left hemiplegia with neglect Referring Physician: [facility]   HPI: Christy Waters is a 86 y.o. 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  Per chart review patient lives with children.  Reportedly independent prior to admission using a Rollator.  1 level home 3 steps to entry.  She was independent for ADLs but does have caregiver aids if needed to assist.  Presented 10/28/2018 while left-sided weakness and neglect as well as recent fall.  Admission chemistries unremarkable except glucose of 136.  Cranial CT scan showed acute infarct involving the posterior right temporal lobe.  Additional smaller acute infarct involving the parasagittal posterior right frontal lobe.  Patient did not receive TPA.  CT angiogram of head and neck with no large vessel occlusion or stenosis.  MRI showed multiple acute infarcts in the right MCA distal ACA and watershed territories.  Echocardiogram pending.  Currently maintained on aspirin 325 mg daily and Plavix for CVA prophylaxis.  Therapy evaluations completed with recommendations of physical medicine rehab consult.  Pt reports legs are hurting from nursing moving her- not normally painful, but has bad bruises on LEs.  LBM 2-3 days ago- not eating much so denies constipation.   Review of Systems  Constitutional: Negative for chills and fever.  HENT: Negative for hearing loss.   Eyes: Negative for blurred vision and double vision.  Respiratory: Negative for cough and shortness of breath.   Cardiovascular: Negative for chest pain and leg swelling.  Gastrointestinal: Positive for constipation. Negative for heartburn, nausea and vomiting.  Genitourinary: Negative for dysuria, flank pain and hematuria.  Musculoskeletal: Positive for joint pain and myalgias.  Skin: Negative for rash.  Neurological: Positive for  headaches.  All other systems reviewed and are negative.  Past Medical History:  Diagnosis Date  . Anemia    distant past  . Arthritis    fingers, hips  . Cancer ([place])    skin cancer; some spots on left side of face; been 5 years since last treated;   . Headache   . History of echocardiogram    a. TTE 04/06/17: EF > 55%, no RWMA, no LA mass, severe TR, RV systolic dysfunction, elevated PASP; b. TTE 04/2017: EF 65-70%, no RWMA, Gr1DD, calcified mitral annulus w/ mild MR, mild to mod dilated LA w/ irregular, echodense material in the LA incompletely visualized, possibly representing artifact vs thrombus, or intracardiac mass, rec TEE, mod TR, mildly dilated RV, inc PASP  . History of nuclear stress test    a. 12/18: no sig ischemia, low risk  . HLD (hyperlipidemia)   . Left atrial mass    a. see echo from 04/2017 - ? mass vs LA thrombus in the setting of PAF -->Eliquis.  . Osteoporosis   . PAF (paroxysmal atrial fibrillation) ([place])    a. diagnosed 05/10/2017; b. CHADS2VASc --> 3 (age x 2, female)--> Eliquis 2.5 (age/wt).   Past Surgical History:  Procedure Laterality Date  . CATARACT EXTRACTION W/PHACO Left 09/14/2015   Procedure: CATARACT EXTRACTION PHACO AND INTRAOCULAR LENS PLACEMENT (IOC);  Surgeon: Ronnell Freshwater, MD;  Location: [facility];  Service: Ophthalmology;  Laterality: Left;  MALYUGIN  . EYE SURGERY Right    cataract surgery schedule for Easter Monday 2017;    Family History  Problem Relation Age of Onset  . CAD Mother   . Arrhythmia Mother   . Liver cancer Father  Social History:  reports that she has never smoked. She has never used smokeless tobacco. She reports that she does not drink alcohol or use drugs. Allergies:  Allergies  Allergen Reactions  . Azithromycin Shortness Of Breath and Other (See Comments)    Syncope Can not stand up, feels very sick, weak Syncope Syncope Can not stand up, feels very sick, weak Syncope Syncope Can  not stand up, feels very sick, weak Syncope Can not stand up, feels very sick, weak Syncope  . Clindamycin Other (See Comments)    GI problem  GI problem  GI problem   . Clindamycin/Lincomycin   . Hyoscyamine Diarrhea  . Lincomycin Other (See Comments)  . Maxitrol [Neomycin-Polymyxin-Dexameth]     Eye irritation  . Other Other (See Comments)    Eye irritation Eye irritation Eye irritation  . Timolol Other (See Comments)    Eye irritation Eye irritation Eye irritation Eye irritation  . Codeine Other (See Comments) and Nausea Only    Altered mental status FATIGUE, SICK FEELING Altered mental status Altered mental status FATIGUE, SICK FEELING  . Penicillins Rash    Has patient had a PCN reaction causing immediate rash, facial/tongue/throat swelling, SOB or lightheadedness with hypotension: Yes Has patient had a PCN reaction causing severe rash involving mucus membranes or skin necrosis: No Has patient had a PCN reaction that required hospitalization: No Has patient had a PCN reaction occurring within the last 10 years: No If all of the above answers are "NO", then may proceed with Cephalosporin use.   Medications Prior to Admission  Medication Sig Dispense Refill  . amitriptyline (ELAVIL) 10 MG tablet Take 5 mg by mouth every evening.    . Cholecalciferol (VITAMIN D3) 50 MCG (2000 UT) TABS Take 2,000 Units by mouth every evening. With food    . clonazePAM (KLONOPIN) 0.25 MG disintegrating tablet Take 0.25 mg by mouth daily before breakfast.    . dicyclomine (BENTYL) 10 MG capsule Take 10 mg by mouth every 6 (six) hours as needed (abdominal pain/cramping).     Marland Kitchen LUMIGAN 0.01 % SOLN Place 1 drop into both eyes at bedtime.     Marland Kitchen olmesartan (BENICAR) 20 MG tablet Take 5 mg by mouth at bedtime.     Marland Kitchen PARoxetine (PAXIL) 20 MG tablet Take 20 mg by mouth in the morning. With food    . potassium chloride (KLOR-CON) 10 MEQ tablet Take 10 mEq by mouth in the morning. With food    .  Probiotic Product (ALIGN) 4 MG CAPS Take 1 capsule by mouth in the morning.     . feeding supplement, ENSURE ENLIVE, (ENSURE ENLIVE) LIQD Take 237 mLs by mouth 2 (two) times daily between meals. (Patient not taking: Reported on 10/28/2019) 237 mL 12    Home: Home Living Family/patient expects to be discharged to:: Private residence Living Arrangements: Children Available Help at Discharge: Family, Available 24 hours/day, Personal care attendant Type of Home: House Home Access: Stairs to enter Technical brewer of Steps: 3 Entrance Stairs-Rails: Left Home Layout: One level Bathroom Shower/Tub: Chiropodist: Standard Home Equipment: Environmental consultant - 4 wheels, Shower seat, Bedside commode, Transport chair Additional Comments: pt has 24/7 support from caregivers  Lives With: (24 hour caregiver)  Functional History: Prior Function Level of Independence: Independent Comments: Pt was independent with ambulation using rollator and reports could do community distances.  She was independent for ADLs and some IADLs.  Has caregiver aides that can assist if needed.  Since her  fall on Thursday has had declining mobility. Functional Status:  Mobility: Bed Mobility Overal bed mobility: Needs Assistance Bed Mobility: Supine to Sit Supine to sit: Mod assist Sit to supine: Mod assist General bed mobility comments: assist for L LE, trunk and scooting forward; cueing for sequencing and technique with pt neglecting movement of L LE (crossing R LE over top of L LE) Transfers Overall transfer level: Needs assistance Equipment used: 2 person hand held assist, Rolling walker (2 wheeled) Transfers: Sit to/from Stand, Google Transfers Sit to Stand: Mod assist, +2 physical assistance, +2 safety/equipment Squat pivot transfers: Max assist, +2 physical assistance, +2 safety/equipment General transfer comment: attempted with RW and unable to manage safety, transitioned to 2 person HHA;  requires mod assist +2 to power up and steady with max assist +2 to squat pivot to recliner given max cueing to sequence technique and full support on L side  Ambulation/Gait General Gait Details: unable    ADL: ADL Overall ADL's : Needs assistance/impaired Eating/Feeding Details (indicate cue type and reason): caregiver reports pt missing food on L side of plate  Grooming: Minimal assistance, Sitting Grooming Details (indicate cue type and reason): able to wash face, but decreased functional use of L hand  Upper Body Bathing: Moderate assistance, Sitting Lower Body Bathing: Maximal assistance, +2 for physical assistance, +2 for safety/equipment, Sit to/from stand Upper Body Dressing : Maximal assistance, Sitting Lower Body Dressing: +2 for physical assistance, Total assistance, +2 for safety/equipment, Sit to/from stand Toilet Transfer: Maximal assistance, +2 for safety/equipment, +2 for physical assistance, Squat-pivot Toilet Transfer Details (indicate cue type and reason): to recliner  Functional mobility during ADLs: Maximal assistance, +2 for physical assistance, +2 for safety/equipment, Moderate assistance, Cueing for safety, Cueing for sequencing General ADL Comments: pt limited by impaired balance, L hemiparesis and inattention, impaired cognition, visual Loss on L side? and decreased activity tolerance  Cognition: Cognition Overall Cognitive Status: Impaired/Different from baseline Arousal/Alertness: Awake/alert Orientation Level: Oriented X4 Attention: Sustained Sustained Attention: Appears intact Memory: Impaired Memory Impairment: Decreased long term memory, Decreased short term memory Decreased Long Term Memory: Verbal basic Decreased Short Term Memory: Functional basic Awareness: Impaired Awareness Impairment: Emergent impairment, Intellectual impairment Problem Solving: Impaired Problem Solving Impairment: Functional basic Executive Function:  Sequencing Safety/Judgment: Impaired Cognition Arousal/Alertness: Awake/alert Behavior During Therapy: WFL for tasks assessed/performed Overall Cognitive Status: Impaired/Different from baseline Area of Impairment: Orientation, Problem solving, Attention, Following commands, Safety/judgement, Awareness Orientation Level: Disoriented to, Place(reports  ) Current Attention Level: Sustained Following Commands: Follows one step commands with increased time, Follows one step commands consistently, Follows multi-step commands inconsistently Safety/Judgement: Decreased awareness of safety, Decreased awareness of deficits Awareness: Emergent Problem Solving: Slow processing, Decreased initiation, Difficulty sequencing, Requires verbal cues, Requires tactile cues General Comments: Patient with decreased safety awareness, L inattention, slow processing and difficulty initating tasks. Continue cog assessments  Blood pressure (!) 154/98, pulse 62, temperature 98.1 F (36.7 C), temperature source Oral, resp. rate 17, height 5\' 6"  (1.676 m), weight 54.3 kg, SpO2 98 %. Physical Exam  Nursing note and vitals reviewed. Constitutional:  Frail appearing 86 yr old female initially asleep, but woke, was appropriate, NAD  HENT:  Head: Normocephalic and atraumatic.  Nose: Nose normal.  Mouth/Throat: Oropharynx is clear and moist. No oropharyngeal exudate.  Large mole on L neck, R chin and L face-  Facial sensation intact Smile intact- tongue midline  Eyes: Right eye exhibits no discharge. Left eye exhibits no discharge.  EOMS on Left decreased movement past  midline to left- able to look a little past midline, but not much No nystagmus seen  Cardiovascular:  Borderline bradycardia- regular rhythm  Respiratory: No stridor.  CTA B/L- no W/R/R- good air movement   GI:  Soft, NT, ND, (+)BS  Hypoactive BS  Musculoskeletal:     Cervical back: Normal range of motion and neck supple.     Comments:  RUE- 5-/5 in biceps, triceps, WE, grip and finger abd LUE- grip 4-/5 on L- 1/5 finger abduction; otherwise 0/5 in LUE RLE- 5-/5 HF, KE, DF and PF LLE- 0/5 in LLE in same muscles  Also has effusion of L dorsum of wrist   Neurological: She is alert.  Patient is alert in no acute distress.  Follows simple commands.  Makes good eye contact with examiner.  Provides her name and age but needed some cues for appropriate month and year.  Ox2- appropriate; sensation to light touch intact in R and L sides per pt- even though had some L neglect-   Skin:  Blackish bruise from midcalf to foot on L and bruising from R knee to ankle   Psychiatric:  appropriate    Results for orders placed or performed during the hospital encounter of 10/28/19 (from the past 24 hour(s))  SARS Coronavirus 2 by RT PCR (hospital order, performed in [place] hospital lab) Nasopharyngeal Nasopharyngeal Swab     Status: None   Collection Time: 10/28/19 11:58 AM   Specimen: Nasopharyngeal Swab  Result Value Ref Range   SARS Coronavirus 2 NEGATIVE NEGATIVE  CBC     Status: Abnormal   Collection Time: 10/29/19  4:55 AM  Result Value Ref Range   WBC 4.7 4.0 - 10.5 K/uL   RBC 3.47 (L) 3.87 - 5.11 MIL/uL   Hemoglobin 11.1 (L) 12.0 - 15.0 g/dL   HCT 34.1 (L) 36.0 - 46.0 %   MCV 98.3 80.0 - 100.0 fL   MCH 32.0 26.0 - 34.0 pg   MCHC 32.6 30.0 - 36.0 g/dL   RDW 14.3 11.5 - 15.5 %   Platelets 201 150 - 400 K/uL   nRBC 0.0 0.0 - 0.2 %  Comprehensive metabolic panel     Status: Abnormal   Collection Time: 10/29/19  4:55 AM  Result Value Ref Range   Sodium 142 135 - 145 mmol/L   Potassium 3.1 (L) 3.5 - 5.1 mmol/L   Chloride 106 98 - 111 mmol/L   CO2 28 22 - 32 mmol/L   Glucose, Bld 106 (H) 70 - 99 mg/dL   BUN 11 8 - 23 mg/dL   Creatinine, Ser 0.72 0.44 - 1.00 mg/dL   Calcium 8.7 (L) 8.9 - 10.3 mg/dL   Total Protein 6.5 6.5 - 8.1 g/dL   Albumin 3.3 (L) 3.5 - 5.0 g/dL   AST 22 15 - 41 U/L   ALT 16 0 - 44 U/L    Alkaline Phosphatase 120 38 - 126 U/L   Total Bilirubin 0.9 0.3 - 1.2 mg/dL   GFR calc non Af Amer >60 >60 mL/min   GFR calc Af Amer >60 >60 mL/min   Anion gap 8 5 - 15   CT Code Stroke CTA Head W/WO contrast  Result Date: 10/28/2019 CLINICAL DATA:  Left-sided weakness EXAM: CT ANGIOGRAPHY HEAD AND NECK TECHNIQUE: Multidetector CT imaging of the head and neck was performed using the standard protocol during bolus administration of intravenous contrast. Multiplanar CT image reconstructions and MIPs were obtained to evaluate the vascular anatomy. Carotid  stenosis measurements (when applicable) are obtained utilizing NASCET criteria, using the distal internal carotid diameter as the denominator. CONTRAST:  48mL OMNIPAQUE IOHEXOL 350 MG/ML SOLN COMPARISON:  None. FINDINGS: CTA NECK Aortic arch: Calcified and noncalcified plaque along the visualized thoracic aorta. Great vessel origins are patent. Right carotid system: Patent. There is calcified plaque at the ICA origin causing less than 50% stenosis. Left carotid system: Patent. Minimal calcified plaque at the ICA origin without measurable stenosis. Vertebral arteries: Patent and codominant. Skeleton: Cervical spine dictated separately. Other neck: No mass or adenopathy. Upper chest: No apical lung mass. Review of the MIP images confirms the above findings CTA HEAD Anterior circulation: Intracranial internal carotid arteries are patent. Right middle cerebral artery is patent with moderate to severe stenosis of the mid M1 segment. Mild stenoses are present more distally. Left middle cerebral artery is patent. There is mild stenosis of the proximal left M1 segment. Additional mild stenosis are present more distally. Anterior cerebral arteries are patent. There is diffuse atherosclerotic irregularity with multifocal stenoses particularly of bilateral A2 and A3 segments, some of which are marked. Posterior circulation: Intracranial vertebral arteries are patent.  Basilar artery is patent. Posterior cerebral arteries are patent with atherosclerotic irregularity particularly of bilateral distal P2 and P3 segments with areas of moderate and severe stenosis. Venous sinuses: Patent as allowed by contrast bolus timing. Review of the MIP images confirms the above findings IMPRESSION: No large vessel occlusion or hemodynamically significant stenosis in the neck. Patent intracranial circulation with multifocal atherosclerosis including moderate to severe stenosis of the mid right M1 MCA and right A2/A3 ACA. Electronically Signed   By: Macy Mis M.D.   On: 10/28/2019 10:21   DG Chest 1 View  Result Date: 10/28/2019 CLINICAL DATA:  Left-sided weakness.  Fall. EXAM: CHEST  1 VIEW COMPARISON:  09/15/2019 FINDINGS: Heart size is mildly enlarged but unchanged. Coarse lung markings are suggestive for chronic changes. No focal airspace disease. Negative for a pneumothorax. No gross abnormality to the osseous structures. IMPRESSION: No acute chest abnormality. Electronically Signed   By: Markus Daft M.D.   On: 10/28/2019 10:57   DG Ankle Complete Left  Result Date: 10/28/2019 CLINICAL DATA:  Fall 4 days ago. Left ankle pain and decreased mobility. EXAM: LEFT ANKLE COMPLETE - 3+ VIEW COMPARISON:  None. FINDINGS: There is no evidence of fracture, dislocation, or joint effusion. There is no evidence of arthropathy or other focal bone abnormality. Generalized osteopenia and mild peripheral vascular calcification noted. IMPRESSION: No acute findings. Electronically Signed   By: Marlaine Hind M.D.   On: 10/28/2019 11:00   CT Code Stroke CTA Neck W/WO contrast  Result Date: 10/28/2019 CLINICAL DATA:  Left-sided weakness EXAM: CT ANGIOGRAPHY HEAD AND NECK TECHNIQUE: Multidetector CT imaging of the head and neck was performed using the standard protocol during bolus administration of intravenous contrast. Multiplanar CT image reconstructions and MIPs were obtained to evaluate the  vascular anatomy. Carotid stenosis measurements (when applicable) are obtained utilizing NASCET criteria, using the distal internal carotid diameter as the denominator. CONTRAST:  62mL OMNIPAQUE IOHEXOL 350 MG/ML SOLN COMPARISON:  None. FINDINGS: CTA NECK Aortic arch: Calcified and noncalcified plaque along the visualized thoracic aorta. Great vessel origins are patent. Right carotid system: Patent. There is calcified plaque at the ICA origin causing less than 50% stenosis. Left carotid system: Patent. Minimal calcified plaque at the ICA origin without measurable stenosis. Vertebral arteries: Patent and codominant. Skeleton: Cervical spine dictated separately. Other neck: No mass or  adenopathy. Upper chest: No apical lung mass. Review of the MIP images confirms the above findings CTA HEAD Anterior circulation: Intracranial internal carotid arteries are patent. Right middle cerebral artery is patent with moderate to severe stenosis of the mid M1 segment. Mild stenoses are present more distally. Left middle cerebral artery is patent. There is mild stenosis of the proximal left M1 segment. Additional mild stenosis are present more distally. Anterior cerebral arteries are patent. There is diffuse atherosclerotic irregularity with multifocal stenoses particularly of bilateral A2 and A3 segments, some of which are marked. Posterior circulation: Intracranial vertebral arteries are patent. Basilar artery is patent. Posterior cerebral arteries are patent with atherosclerotic irregularity particularly of bilateral distal P2 and P3 segments with areas of moderate and severe stenosis. Venous sinuses: Patent as allowed by contrast bolus timing. Review of the MIP images confirms the above findings IMPRESSION: No large vessel occlusion or hemodynamically significant stenosis in the neck. Patent intracranial circulation with multifocal atherosclerosis including moderate to severe stenosis of the mid right M1 MCA and right A2/A3  ACA. Electronically Signed   By: Macy Mis M.D.   On: 10/28/2019 10:21   MR BRAIN WO CONTRAST  Result Date: 10/29/2019 CLINICAL DATA:  Stroke symptoms with worsening weakness EXAM: MRI HEAD WITHOUT CONTRAST TECHNIQUE: Multiplanar, multiecho pulse sequences of the brain and surrounding structures were obtained without intravenous contrast. COMPARISON:  Head CT and CTA earlier today FINDINGS: Brain: Scattered infarcts in the right cerebrum along the MCA, distal ACA, and watershed territories. Largest confluent infarct is at the posterior right temporal to low parietal cortex and white matter measuring up to 3 cm. ACA territory involvement is along the central sulcus. No hemorrhage, hydrocephalus, or masslike finding. Chronic small vessel ischemia in the white matter and pons. There is a line of remote lacunar infarcts in the periventricular right frontal lobe, likely past deep watershed injury. Vascular: Normal flow voids Skull and upper cervical spine: Multilevel cervical ankylosis. No focal marrow lesion. Sinuses/Orbits: Bilateral cataract resection IMPRESSION: Multiple acute infarcts in the right MCA, distal ACA, and watershed territories. Electronically Signed   By: Monte Fantasia M.D.   On: 10/29/2019 04:24   CT C-SPINE NO CHARGE  Result Date: 10/28/2019 CLINICAL DATA:  Trauma, left-sided weakness EXAM: CT CERVICAL SPINE WITHOUT CONTRAST TECHNIQUE: CT of the cervical spine was reconstructed from same day CTA of the neck performed with intravenous contrast. No additional contrast was administered. Axial, coronal, and sagittal reformats were obtained COMPARISON:  None. FINDINGS: Alignment: No significant listhesis. Skull base and vertebrae: No acute cervical spine fracture. There is partial fusion across the C3-C4 and C4-C5 disc spaces. There is fusion of several facets. Soft tissues and spinal canal: No prevertebral fluid or swelling. No visible canal hematoma. Disc levels: Multilevel degenerative  changes are present including disc space narrowing, endplate osteophytes, and facet and uncovertebral hypertrophy. There is no high-grade osseous encroachment on the spinal canal. Upper chest: No apical lung mass. Other: None. IMPRESSION: No acute cervical spine fracture. Electronically Signed   By: Macy Mis M.D.   On: 10/28/2019 11:11   DG Foot Complete Left  Result Date: 10/28/2019 CLINICAL DATA:  Fall 4 days ago. Left foot pain and decreased mobility. Initial encounter. EXAM: LEFT FOOT - COMPLETE 3+ VIEW COMPARISON:  None. FINDINGS: There is no evidence of fracture or dislocation. Mild osteoarthritis is seen involving the interphalangeal joints of the phalanges. Moderate to severe osteoarthritis is seen involving the intertarsal joints and tarsal-metatarsal joints of the midfoot. Small  plantar calcaneal bone spur noted. Generalized osteopenia is noted, but no focal lytic or sclerotic bone lesions are identified. Soft tissues are unremarkable. IMPRESSION: 1. No acute findings. 2. Osteoarthritis and osteopenia. Electronically Signed   By: Marlaine Hind M.D.   On: 10/28/2019 10:59   DG Hip Unilat W or Wo Pelvis 2-3 Views Left  Result Date: 10/28/2019 CLINICAL DATA:  Left-sided weakness status post fall EXAM: DG HIP (WITH OR WITHOUT PELVIS) 2-3V LEFT COMPARISON:  None. FINDINGS: There is no evidence of hip fracture or dislocation. There is no evidence of arthropathy or other focal bone abnormality. There is generalized osteopenia. IMPRESSION: No acute osseous injury of the left. Given the patient's age and osteopenia, if there is persistent clinical concern for an occult hip fracture, a MRI of the hip is recommended for increased sensitivity. Electronically Signed   By: Kathreen Devoid   On: 10/28/2019 10:59   CT HEAD CODE STROKE WO CONTRAST  Result Date: 10/28/2019 CLINICAL DATA:  Code stroke.  Left-sided weakness EXAM: CT HEAD WITHOUT CONTRAST TECHNIQUE: Contiguous axial images were obtained from the  base of the skull through the vertex without intravenous contrast. COMPARISON:  None. FINDINGS: Brain: There is no acute intracranial hemorrhage or mass effect. There is new hypoattenuation with loss of gray-white differentiation in the posterior right temporal lobe. Additional smaller area of loss of gray-white differentiation involving the parasagittal posterior right frontal lobe. There is a new 8 mm focus of hypoattenuation in the right centrum semiovale. Prominence of the ventricles and sulci reflects stable parenchymal volume loss. Additional patchy and confluent hypoattenuation in the supratentorial white matter is nonspecific but probably reflects stable chronic microvascular ischemic changes. Vascular: No hyperdense vessel. Skull: Unremarkable Sinuses/Orbits: Mild mucosal thickening. Bilateral lens replacements. Other: Mastoid air cells are clear. ASPECTS [facility] Stroke Program Early CT Score) - Ganglionic level infarction (caudate, lentiform nuclei, internal capsule, insula, M1-M3 cortex): 6 - Supraganglionic infarction (M4-M6 cortex): 3 Total score (0-10 with 10 being normal): 10 IMPRESSION: No acute intracranial hemorrhage. Acute infarction involving the posterior right temporal lobe. Additional smaller acute infarction involving the parasagittal posterior right frontal lobe. New age-indeterminate subcentimeter infarct of the right centrum semiovale. These results were communicated to Dr. Cheral Marker at 9:30 amon 5/31/2021by text page via the [facility] messaging system. Electronically Signed   By: Macy Mis M.D.   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 Does the need for close, 24 hr/day medical supervision in concert with the patient's rehab needs make it unreasonable for this patient to be served in a less intensive setting? No 2. Co-Morbidities requiring supervision/potential complications: DJD, Afib, HTN, HLD,  frequent falls- per son qweek 3. Due to bladder management, bowel management, safety, skin/wound care, disease management, medication administration and patient education, does the patient require 24 hr/day rehab nursing? Potentially 4. Does the patient require coordinated care of a physician, rehab nurse, therapy disciplines of PT, OT SLP to address physical and functional deficits in the context of the above medical diagnosis(es)? Potentially Addressing deficits in the following areas: balance, endurance, locomotion, strength, transferring, bowel/bladder control, bathing, dressing, feeding, grooming, toileting, cognition and swallowing 5. Can the patient actively participate in an intensive therapy program of at least 3 hrs of therapy per day at least 5 days per week? Potentially 6. The potential for patient to make measurable gains while on inpatient rehab is fair 7. Anticipated functional outcomes upon discharge from  inpatient rehab are min assist and mod assist  with PT, min assist and mod assist with OT, min assist with SLP. 8. Estimated rehab length of stay to reach the above functional goals is: 1-2 months 9. Anticipated discharge destination: Home 10. Overall Rehab/Functional Prognosis: fair  RECOMMENDATIONS: This patient's condition is appropriate for continued rehabilitative care in the following setting: SNF Patient has agreed to participate in recommended program. Potentially Note that insurance prior authorization may be required for reimbursement for recommended care.  Comment:  41. pt is 86 years old- was falling weekly at home prior to stroke- per son and granddaughter- had 24 hours caregivers, however in spite of their good care, would still fall- was low level prior to stroke, and sounds like her family thinks being closer to home in [place] 1-2 hours/day of therapy would be better than putting her through 3 hours/day of therapy, which is needed to go to CIR.  2. Discussed  separately with son, and granddaughter didn't meet up with daughters- however both son and granddaughter both felt 1-2 hours/day is more appropriate for a longer period of time.  3. Suggest that pt might be constipated- please address if needed.   4. Will allow SW to work on SNF- thank you for this consult.    Lavon Paganini Angiulli, PA-C 10/29/2019   I have personally performed a face to face diagnostic evaluation of this patient and formulated the key components of the plan.  Additionally, I have personally reviewed laboratory data, imaging studies, as well as relevant notes and concur with the physician assistant's documentation above.

## 2019-10-29 NOTE — Evaluation (Signed)
Occupational Therapy Evaluation Patient Details Name: Christy Waters MRN: [id number] DOB: [birth year] Today's Date: 10/29/2019    History of Present Illness Pt is a 86 y.o. female with hx of HTN, HLD, Afib not on anticoagulation due to hx of falls, OA, depression, who presented from home via EMS with L sided deficits and was found to have an acute posterior right temporal lobe and parasagittal posterior R frontal lobe.  Her symptoms began on Thursday when she fell.  L LE imaging negative for acute fx.   Clinical Impression   PTA patient independent using walker for mobility and ADLs, having support of 24/7 caregivers as needed.  She was admitted for above and is limited by problem list below, including L hemiparesis, L sided decreased coordination and tone (increased tone L LE and flaccid L UE, slight increased in tone in hand towards flexion synergy after ROM), L inattention and visual deficits with R gaze preference, impaired balance, decreased activity tolerance, and generalized weakness. She is very pleasant, follows simple commands with increased time but requires max cueing to attend to L side, problem solve and sequence/initate tasks. She currently requires mod assist for bed mobility, mod assist +2 for sit to stand and max assist +2 to squat pivot transfer, min-total assist +2 for ADLs.  She will benefit from continued OT services while admitted and after dc at SNF to optimize independence and decrease burden of care prior to dc home.  Will follow acutely.     Follow Up Recommendations  CIR;Supervision/Assistance - 24 hour    Equipment Recommendations  Other (comment)(TBD at next venue of care )    Recommendations for Other Services       Precautions / Restrictions Precautions Precautions: Fall Precaution Comments: L inattention, L hemi Restrictions Weight Bearing Restrictions: No      Mobility Bed Mobility Overal bed mobility: Needs Assistance Bed Mobility: Supine to  Sit     Supine to sit: Mod assist     General bed mobility comments: assist for L LE, trunk and scooting forward; cueing for sequencing and technique with pt neglecting movement of L LE (crossing R LE over top of L LE)  Transfers Overall transfer level: Needs assistance Equipment used: 2 person hand held assist;Rolling walker (2 wheeled) Transfers: Sit to/from [employer] Sit to Stand: Mod assist;+2 physical assistance;+2 safety/equipment   Squat pivot transfers: Max assist;+2 physical assistance;+2 safety/equipment     General transfer comment: attempted with RW and unable to manage safety, transitioned to 2 person HHA; requires mod assist +2 to power up and steady with max assist +2 to squat pivot to recliner given max cueing to sequence technique and full support on L side     Balance Overall balance assessment: Needs assistance Sitting-balance support: Single extremity supported;Feet supported Sitting balance-Leahy Scale: Fair Sitting balance - Comments: min assist to min guard, inital R lateral and posterior lean lean but with fatigue leaned to L sid; able to correct with mulitmodal cueing    Standing balance support: Bilateral upper extremity supported;During functional activity Standing balance-Leahy Scale: Zero Standing balance comment: relies on external support                           ADL either performed or assessed with clinical judgement   ADL Overall ADL's : Needs assistance/impaired   Eating/Feeding Details (indicate cue type and reason): caregiver reports pt missing food on L side of plate  Grooming: Minimal assistance;Sitting  Grooming Details (indicate cue type and reason): able to wash face, but decreased functional use of L hand  Upper Body Bathing: Moderate assistance;Sitting   Lower Body Bathing: Maximal assistance;+2 for physical assistance;+2 for safety/equipment;Sit to/from stand   Upper Body Dressing : Maximal  assistance;Sitting   Lower Body Dressing: +2 for physical assistance;Total assistance;+2 for safety/equipment;Sit to/from stand   Toilet Transfer: Maximal assistance;+2 for safety/equipment;+2 for physical assistance;Squat-pivot Toilet Transfer Details (indicate cue type and reason): to recliner          Functional mobility during ADLs: Maximal assistance;+2 for physical assistance;+2 for safety/equipment;Moderate assistance;Cueing for safety;Cueing for sequencing General ADL Comments: pt limited by impaired balance, L hemiparesis and inattention, impaired cognition, visual Loss on L side? and decreased activity tolerance     Vision Baseline Vision/History: Wears glasses Wears Glasses: At all times Patient Visual Report: No change from baseline(glasses not present ) Vision Assessment?: Yes;Vision impaired- to be further tested in functional context Eye Alignment: Within Functional Limits Ocular Range of Motion: Within Functional Limits Alignment/Gaze Preference: Gaze right(preference) Tracking/Visual Pursuits: Able to track stimulus in all quads without difficulty;Other (comment)(increased time to track towards L side ) Visual Fields: Left visual field deficit Additional Comments: pt unable to locate correct numbers in L visual field, further assesment required     Perception     Praxis      Pertinent Vitals/Pain Pain Assessment: Faces Faces Pain Scale: Hurts little more Pain Location: L leg with movement/stretch Pain Descriptors / Indicators: Grimacing Pain Intervention(s): Limited activity within patient's tolerance;Monitored during session;Repositioned     Hand Dominance Right   Extremity/Trunk Assessment Upper Extremity Assessment Upper Extremity Assessment: RUE deficits/detail;LUE deficits/detail RUE Deficits / Details: WFL LUE Deficits / Details: PROM WFL, Shoulder 0/5 MMT, Elbow flexion 3-/5, elbow extension 1/5, supination 3-/5, pronation 0/5, grasp 3-/5, extension  1/5; increased tone in hand after functional use; L inattention  LUE Sensation: decreased light touch;decreased proprioception LUE Coordination: decreased fine motor;decreased gross motor   Lower Extremity Assessment Lower Extremity Assessment: Defer to PT evaluation   Cervical / Trunk Assessment Cervical / Trunk Assessment: Kyphotic   Communication Communication Communication: No difficulties   Cognition Arousal/Alertness: Awake/alert Behavior During Therapy: WFL for tasks assessed/performed Overall Cognitive Status: Impaired/Different from baseline Area of Impairment: Orientation;Problem solving;Attention;Following commands;Safety/judgement;Awareness                 Orientation Level: Disoriented to;Place(reports [place] ) Current Attention Level: Sustained   Following Commands: Follows one step commands with increased time;Follows one step commands consistently;Follows multi-step commands inconsistently Safety/Judgement: Decreased awareness of safety;Decreased awareness of deficits Awareness: Emergent Problem Solving: Slow processing;Decreased initiation;Difficulty sequencing;Requires verbal cues;Requires tactile cues General Comments: Patient with decreased safety awareness, L inattention, slow processing and difficulty initating tasks. Continue cog assessments   General Comments       Exercises     Shoulder Instructions      Home Living Family/patient expects to be discharged to:: Private residence Living Arrangements: Children Available Help at Discharge: Family;Available 24 hours/day;Personal care attendant Type of Home: House Home Access: Stairs to enter [employer] of Steps: 3 Entrance Stairs-Rails: Left Home Layout: One level     Bathroom Shower/Tub: Teacher, early years/pre: Standard     Home Equipment: Environmental consultant - 4 wheels;Shower seat;Bedside commode;Transport chair   Additional Comments: pt has 24/7 support from caregivers   Lives With: (24 hour caregiver)    Prior Functioning/Environment Level of Independence: Independent        Comments:  Pt was independent with ambulation using rollator and reports could do community distances.  She was independent for ADLs and some IADLs.  Has caregiver aides that can assist if needed.  Since her fall on Thursday has had declining mobility.        OT Problem List: Decreased strength;Decreased activity tolerance;Impaired balance (sitting and/or standing);Decreased coordination;Decreased cognition;Decreased safety awareness;Impaired vision/perception;Decreased range of motion;Decreased knowledge of use of DME or AE;Decreased knowledge of precautions;Impaired tone;Impaired sensation;Impaired UE functional use;Pain      OT Treatment/Interventions: Self-care/ADL training;DME and/or AE instruction;Therapeutic activities;Cognitive remediation/compensation;Visual/perceptual remediation/compensation;Patient/family education;Balance training;Neuromuscular education;Energy conservation;Splinting;Manual therapy    OT Goals(Current goals can be found in the care plan section) Acute Rehab OT Goals Patient Stated Goal: return to walking OT Goal Formulation: With patient Time For Goal Achievement: 11/12/19 Potential to Achieve Goals: Fair  OT Frequency: Min 2X/week   Barriers to D/C:            Co-evaluation PT/OT/SLP Co-Evaluation/Treatment: Yes Reason for Co-Treatment: For patient/therapist safety;To address functional/ADL transfers   OT goals addressed during session: ADL's and self-care      AM-PAC OT "6 Clicks" Daily Activity     Outcome Measure Help from another person eating meals?: A Little Help from another person taking care of personal grooming?: A Little Help from another person toileting, which includes using toliet, bedpan, or urinal?: Total Help from another person bathing (including washing, rinsing, drying)?: A Lot Help from another person to put on and  taking off regular upper body clothing?: A Lot Help from another person to put on and taking off regular lower body clothing?: Total 6 Click Score: 12   End of Session Equipment Utilized During Treatment: Gait belt;Rolling walker Nurse Communication: Mobility status;Other (comment)(position)  Activity Tolerance: Patient tolerated treatment well Patient left: with call bell/phone within reach;in chair;with family/visitor present  OT Visit Diagnosis: Other abnormalities of gait and mobility (R26.89);Muscle weakness (generalized) (M62.81);Pain;Other symptoms and signs involving cognitive function;Hemiplegia and hemiparesis;Low vision, both eyes (H54.2);History of falling (Z91.81) Hemiplegia - Right/Left: Left Hemiplegia - dominant/non-dominant: Non-Dominant Hemiplegia - caused by: Cerebral infarction Pain - Right/Left: Left Pain - part of body: Leg;Ankle and joints of foot                Time: [id number] OT Time Calculation (min): 37 min Charges:  OT General Charges $OT Visit: 1 Visit OT Evaluation $OT Eval Moderate Complexity: 1 Mod OT Treatments $Self Care/Home Management : 8-22 mins  Jolaine Artist, OT Acute Rehabilitation Services Pager [phone] Office [phone]    Christy Waters 10/29/2019, 11:03 AM

## 2019-10-29 NOTE — Progress Notes (Signed)
daughter called back Lacinda Axon Daughter [phone]

## 2019-10-29 NOTE — Progress Notes (Signed)
Inpatient Rehab Admissions Coordinator Note:   Per therapy recommendations, pt was screened for CIR candidacy by Shann Medal, PT, DPT.  At this time we are recommending in inpatient rehab consult.  I will place an order per our protocol.  Please contact me with questions.   Shann Medal, PT, DPT [phone] 10/29/19 11:43 AM

## 2019-10-30 LAB — URINE CULTURE

## 2019-10-30 NOTE — TOC Initial Note (Signed)
Transition of Care [facility]) - Initial/Assessment Note    Patient Details  Name: Christy Waters MRN: [id number] Date of Birth: [birth year]  [facility] CM/SW Contact:    Geralynn Ochs, LCSW Phone Number: 10/30/2019, 1:39 PM  Clinical Narrative:    CSW met with patient and caregiver to at bedside to discuss recommendation for SNF. Patient in agreement, said to contact her daughters, either Jan or [place]. Caregiver at bedside indicated [facility] would be the best to try first. CSW spoke with [facility] to discuss SNF, she is in agreement. CSW discussed CMS list with [facility], the family will research options.  Patient's PASRR is unable to be submitted due to the patient's social security number already being in use. Family to bring copy of patient's social security card to CSW to fix PASRR system to obtain patient's PASRR number.   CSW to initiate insurance authorization process for SNF placement.                Expected Discharge Plan: Skilled Nursing Facility Barriers to Discharge: Barriers Resolved   Patient Goals and CMS Choice Patient states their goals for this hospitalization and ongoing recovery are:: to get rehab and back home CMS Medicare.gov Compare Post Acute Care list provided to:: Patient Represenative (must comment) Choice offered to / list presented to : Adult Children  Expected Discharge Plan and Services Expected Discharge Plan: [place] Acute Care Choice: [place] Living arrangements for the past 2 months: Single Family Home                                      Prior Living Arrangements/Services Living arrangements for the past 2 months: Single Family Home Lives with:: Self, Other (Comment)(caregiver) Patient language and need for interpreter reviewed:: No Do you feel safe going back to the place where you live?: Yes      Need for Family Participation in Patient Care: Yes (Comment) Care giver support  system in place?: No (comment) Current home services: DME, Homehealth aide Criminal Activity/Legal Involvement Pertinent to Current Situation/Hospitalization: No - Comment as needed  Activities of Daily Living Home Assistive Devices/Equipment: Wheelchair, Environmental consultant (specify type), Eyeglasses, Bedside commode/3-in-1, Shower chair with back, Grab bars in shower, Grab bars around toilet, Blood pressure cuff ADL Screening (condition at time of admission) Patient's cognitive ability adequate to safely complete daily activities?: Yes Is the patient deaf or have difficulty hearing?: No Does the patient have difficulty seeing, even when wearing glasses/contacts?: Yes Does the patient have difficulty concentrating, remembering, or making decisions?: Yes Patient able to express need for assistance with ADLs?: Yes Does the patient have difficulty dressing or bathing?: Yes Independently performs ADLs?: No Communication: Independent Dressing (OT): Needs assistance Is this a change from baseline?: Change from baseline, expected to last >3 days Grooming: Needs assistance Is this a change from baseline?: Change from baseline, expected to last >3 days Feeding: Independent Bathing: Needs assistance Is this a change from baseline?: Pre-admission baseline Toileting: Needs assistance Is this a change from baseline?: Pre-admission baseline In/Out Bed: Needs assistance Is this a change from baseline?: Pre-admission baseline Walks in Home: Needs assistance Is this a change from baseline?: Pre-admission baseline Does the patient have difficulty walking or climbing stairs?: Yes Weakness of Legs: Both Weakness of Arms/Hands: Left  Permission Sought/Granted Permission sought to share information with : Customer service manager, Family  Supports Permission granted to share information with : Yes, Verbal Permission Granted  Share Information with NAME: Montey Hora  Permission granted to share info w  AGENCY: SNF  Permission granted to share info w Relationship: Children     Emotional Assessment Appearance:: Appears stated age Attitude/Demeanor/Rapport: Engaged Affect (typically observed): Pleasant Orientation: : Oriented to Self, Oriented to Place, Oriented to  Time, Oriented to Situation Alcohol / Substance Use: Not Applicable Psych Involvement: No (comment)  Admission diagnosis:  Trauma [T14.90XA] Stroke [facility]) [I63.9] Patient Active Problem List   Diagnosis Date Noted  . Stroke ([place]) 10/28/2019  . Hypertensive urgency 09/16/2019  . Syncope, vasovagal 09/15/2019  . Syncope and collapse 09/15/2019  . Acute lower UTI 09/15/2019  . Pelvic pain in female 03/20/2018  . Cor pulmonale ([place]) 09/19/2017  . Low serum vitamin D 06/19/2017  . History of atrial fibrillation 06/19/2017  . Chest pain 05/10/2017  . Moderate episode of recurrent major depressive disorder ([place]) 04/04/2017  . Adult idiopathic generalized osteoporosis 04/13/2016  . 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 Irritable bowel syndrome with diarrhea 05/20/2014  . Abdominal pain, generalized 04/01/2014  . Atherosclerosis of aorta ([place]) 03/05/2014  . 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 Idiopathic peripheral neuropathy 03/05/2014  . Abdominal pain of unknown cause 02/06/2014  . MI (mitral incompetence) 02/06/2014  . Stress incontinence 02/06/2014  . Fibrosis of lung ([place]) 10/03/2013  . Generalized OA 10/03/2013  . HLD (hyperlipidemia) 10/03/2013  . BP (high blood pressure) 10/03/2013  . OP (osteoporosis) 10/03/2013  . Beat, premature ventricular 10/03/2013  . Ventricular premature depolarization 10/03/2013   PCP:  Rusty Aus, MD Pharmacy:   [facility] [address], [place] [address] Phone: [phone] Fax: [place], [place] [address] [address] Phone: [phone] Fax:  [place], [place] [address] [place] [address] Phone: [phone] Fax: [phone]  [id number] [id number]: [phone]: [phone]    Social Determinants of Health (SDOH) Interventions    Readmission Risk Interventions No flowsheet data found.

## 2019-10-30 NOTE — Progress Notes (Signed)
Inpatient Rehabilitation Admissions Coordinator  I contacted pt's son, Ulice Dash, by phone to discuss SNF recommendation. He is aware and asks that Katharine Look, be the point of contact. I have alerted Acute team as well as TOC team. We will sign off at this time.  Danne Baxter, RN, MSN Rehab Admissions Coordinator [phone] 10/30/2019 12:57 PM

## 2019-10-30 NOTE — Progress Notes (Signed)
PROGRESS NOTE    Christy Waters  AFB:[id number] DOB: 11/28/1918 DOA: 10/28/2019 PCP: Rusty Aus, MD    Brief Narrative:  86 year old female with history of hypertension, hyperlipidemia, chronic A. fib not on anticoagulation due to history of falls, osteoarthritis who lives at home with 24 hours rotating caregivers presented to the hospital with left-sided hemiplegia and found to have acute stroke on imaging.  She probably had symptoms ongoing for last few days as she was noted to be weak on the left side and fell last week but declined to come to the hospital. In the emergency room left hemiplegia, CT scan and MRI consistent with right ACA and MCA stroke.   Assessment & Plan:   Active Problems:   HLD (hyperlipidemia)   BP (high blood pressure)   MI (mitral incompetence)   OP (osteoporosis)   Moderate episode of recurrent major depressive disorder (HCC)   History of atrial fibrillation   Stroke ([place])  Acute ischemic stroke, right ACA and MCA stroke: Clinical findings, unable to move left side of the body including left arm and left leg. CT head findings, acute infarction posterior right temporal lobe, smaller acute infarction on the parasagittal posterior right frontal lobe. MRI of the brain, multiple acute infarcts in the right MCA, distal ACA and watershed territories. CTA of the head and neck, no large vessel occlusion.  Atherosclerotic disease and intracranial stenosis. 2D echocardiogram, Antiplatelets, does have history of A. fib, not on anticoagulation due to fall risk and bleeding risk.  Neurology recommended dual antiplatelet therapy.  Started. LDL, 280.  started on high intensity statin.  She is 86 year old, hopefully statin will help. Hemoglobin A1c, 5.1.  No indication for treatment. Therapy recommendations, acute inpatient rehab. Inpatient rehab recommended skilled level of care.  Essential hypertension: We will resume home medications.  She is about 4 to 5  days out of acute stroke.  Paroxysmal atrial fibrillation: Currently in sinus rhythm.  Unable to anticoagulate because of fall risk, side effects outweigh benefits.  Depression: On SSRI and amitriptyline that she will continue.  Hypokalemia: Replaced .  On a scheduled replacement.  DVT prophylaxis: SCD Code Status: DNR Family Communication: Met with son at bedside.  6/1. Disposition Plan: Status is: Inpatient  Remains inpatient appropriate because:Inpatient level of care appropriate due to severity of illness.  Is ready to go to skilled nursing rehab.   Dispo: The patient is from: Home              Anticipated d/c is to: SNF              Anticipated d/c date is: 1 day              Patient currently is medically stable to go to a skilled level of care.    Consultants:   Neurology  Procedures:   None  Antimicrobials:   None   Subjective: Patient seen and examined.  No overnight events.  Patient herself denies any complaints.  Patient's caretaker Ms. Helen at the bedside and feeding her.  Unable to move left side.  Objective: Vitals:   10/29/19 1900 10/29/19 2303 10/30/19 0355 10/30/19 0848  BP: (!) 166/93 (!) 108/59 136/77 (!) 173/89  Pulse: 73 63 63 68  Resp: '19 20 19 20  ' Temp: 97.7 F (36.5 C) 97.7 F (36.5 C) 97.8 F (36.6 C) 98 F (36.7 C)  TempSrc: Oral Axillary Oral Oral  SpO2: 97% 95%  97%  Weight:  Height:        Intake/Output Summary (Last 24 hours) at 10/30/2019 1124 Last data filed at 10/30/2019 0543 Gross per 24 hour  Intake --  Output 750 ml  Net -750 ml   Filed Weights   10/28/19 0918  Weight: 54.3 kg    Examination:  General exam: Appears calm and comfortable , appropriate for age. Respiratory system: Clear to auscultation. Respiratory effort normal. Cardiovascular system: S1 & S2 heard, RRR. No JVD, murmurs, rubs, gallops or clicks. No pedal edema. Gastrointestinal system: Abdomen is nondistended, soft and nontender. No organomegaly  or masses felt. Normal bowel sounds heard. Central nervous system: Alert and oriented.  No cranial nerve deficits. Right upper and lower extremity normal power. Left upper extremity 2/5.  She was able to weakly squeeze my hand.   Left lower extremity flaccid today.  0/5.   She also has some skin lacerations.   Psychiatry: Judgement and insight appear normal. Mood & affect appropriate.     Data Reviewed: I have personally reviewed following labs and imaging studies  CBC: Recent Labs  Lab 10/28/19 0918 10/28/19 0924 10/29/19 0455  WBC 6.8  --  4.7  NEUTROABS 5.8  --   --   HGB 12.9 12.2 11.1*  HCT 38.5 36.0 34.1*  MCV 96.5  --  98.3  PLT 233  --  196   Basic Metabolic Panel: Recent Labs  Lab 10/28/19 0918 10/28/19 0924 10/29/19 0455  NA 142 143 142  K 3.9 4.0 3.1*  CL 106 121* 106  CO2 26  --  28  GLUCOSE 136* 137* 106*  BUN '9 10 11  ' CREATININE 0.75 0.80 0.72  CALCIUM 9.3  --  8.7*   GFR: Estimated Creatinine Clearance: 33.7 mL/min (by C-G formula based on SCr of 0.72 mg/dL). Liver Function Tests: Recent Labs  Lab 10/28/19 0918 10/29/19 0455  AST 26 22  ALT 21 16  ALKPHOS 148* 120  BILITOT 0.9 0.9  PROT 7.4 6.5  ALBUMIN 3.8 3.3*   No results for input(s): LIPASE, AMYLASE in the last 168 hours. No results for input(s): AMMONIA in the last 168 hours. Coagulation Profile: Recent Labs  Lab 10/28/19 0918  INR 1.0   Cardiac Enzymes: No results for input(s): CKTOTAL, CKMB, CKMBINDEX, TROPONINI in the last 168 hours. BNP (last 3 results) No results for input(s): PROBNP in the last 8760 hours. HbA1C: Recent Labs    10/28/19 0918  HGBA1C 5.1   CBG: Recent Labs  Lab 10/28/19 0915  GLUCAP 126*   Lipid Profile: Recent Labs    10/28/19 0918  CHOL 379*  HDL 81  LDLCALC 280*  TRIG 88  CHOLHDL 4.7   Thyroid Function Tests: No results for input(s): TSH, T4TOTAL, FREET4, T3FREE, THYROIDAB in the last 72 hours. Anemia Panel: No results for  input(s): VITAMINB12, FOLATE, FERRITIN, TIBC, IRON, RETICCTPCT in the last 72 hours. Sepsis Labs: No results for input(s): PROCALCITON, LATICACIDVEN in the last 168 hours.  Recent Results (from the past 240 hour(s))  SARS Coronavirus 2 by RT PCR (hospital order, performed in [facility] hospital lab) Nasopharyngeal Nasopharyngeal Swab     Status: None   Collection Time: 10/28/19 11:58 AM   Specimen: Nasopharyngeal Swab  Result Value Ref Range Status   SARS Coronavirus 2 NEGATIVE NEGATIVE Final    Comment: (NOTE) SARS-CoV-2 target nucleic acids are NOT DETECTED. The SARS-CoV-2 RNA is generally detectable in upper and lower respiratory specimens during the acute phase of infection. The lowest concentration  of SARS-CoV-2 viral copies this assay can detect is 250 copies / mL. A negative result does not preclude SARS-CoV-2 infection and should not be used as the sole basis for treatment or other patient management decisions.  A negative result may occur with improper specimen collection / handling, submission of specimen other than nasopharyngeal swab, presence of viral mutation(s) within the areas targeted by this assay, and inadequate number of viral copies (<250 copies / mL). A negative result must be combined with clinical observations, patient history, and epidemiological information. Fact Sheet for Patients:   StrictlyIdeas.no Fact Sheet for Healthcare Providers: BankingDealers.co.za This test is not yet approved or cleared  by the [place] FDA and has been authorized for detection and/or diagnosis of SARS-CoV-2 by FDA under an Emergency Use Authorization (EUA).  This EUA will remain in effect (meaning this test can be used) for the duration of the COVID-19 declaration under Section 564(b)(1) of the Act, 21 U.S.C. section 360bbb-3(b)(1), unless the authorization is terminated or revoked sooner. Performed at [facility],  [place] [address], [place], [place] [address]   Culture, Urine     Status: Abnormal   Collection Time: 10/29/19  4:42 AM   Specimen: Urine, Clean Catch  Result Value Ref Range Status   Specimen Description URINE, CLEAN CATCH  Final   Special Requests   Final    NONE Performed at [facility], [place] [address], [place], [place] [address]    Culture MULTIPLE SPECIES PRESENT, SUGGEST RECOLLECTION (A)  Final   Report Status 10/30/2019 FINAL  Final         Radiology Studies: MR BRAIN WO CONTRAST  Result Date: 10/29/2019 CLINICAL DATA:  Stroke symptoms with worsening weakness EXAM: MRI HEAD WITHOUT CONTRAST TECHNIQUE: Multiplanar, multiecho pulse sequences of the brain and surrounding structures were obtained without intravenous contrast. COMPARISON:  Head CT and CTA earlier today FINDINGS: Brain: Scattered infarcts in the right cerebrum along the MCA, distal ACA, and watershed territories. Largest confluent infarct is at the posterior right temporal to low parietal cortex and white matter measuring up to 3 cm. ACA territory involvement is along the central sulcus. No hemorrhage, hydrocephalus, or masslike finding. Chronic small vessel ischemia in the white matter and pons. There is a line of remote lacunar infarcts in the periventricular right frontal lobe, likely past deep watershed injury. Vascular: Normal flow voids Skull and upper cervical spine: Multilevel cervical ankylosis. No focal marrow lesion. Sinuses/Orbits: Bilateral cataract resection IMPRESSION: Multiple acute infarcts in the right MCA, distal ACA, and watershed territories. Electronically Signed   By: Monte Fantasia M.D.   On: 10/29/2019 04:24   ECHOCARDIOGRAM COMPLETE  Result Date: 10/29/2019    ECHOCARDIOGRAM REPORT   Patient Name:   [facility] [place] Date of Exam: 10/29/2019 Medical Rec #:  [id number]               Height:       66.0 in Accession #:    [id number]              Weight:       119.7 lb Date of Birth:  07-Apr-1919                BSA:          1.608 m Patient Age:    57 years                BP:  154/98 mmHg Patient Gender: F                       HR:           62 bpm. Exam Location:  Inpatient Procedure: 2D Echo, Cardiac Doppler and Color Doppler Indications:    Stroke 434.91 / I163.9  History:        Patient has prior history of Echocardiogram examinations, most                 recent 09/17/2019. Previous Myocardial Infarction, COPD,                 Arrythmias:Atrial Fibrillation; Risk Factors:Hypertension and                 Dyslipidemia. Cancer.  Sonographer:    Jonelle Sidle Dance Referring Phys: [id number] [place] M ECKSTAT IMPRESSIONS  1. Left ventricular ejection fraction, by estimation, is 60 to 65%. The left ventricle has normal function. The left ventricle has no regional wall motion abnormalities. There is mild concentric left ventricular hypertrophy. Left ventricular diastolic parameters are consistent with Grade I diastolic dysfunction (impaired relaxation).  2. Right ventricular systolic function is normal. The right ventricular size is normal. There is normal pulmonary artery systolic pressure.  3. Left atrial size was mildly dilated.  4. Right atrial size was mildly dilated.  5. The mitral valve is degenerative. Mild mitral valve regurgitation.  6. Tricuspid valve regurgitation is moderate to severe.  7. The aortic valve is tricuspid. Aortic valve regurgitation is not visualized. Mild aortic valve sclerosis is present, with no evidence of aortic valve stenosis.  8. The inferior vena cava is normal in size with greater than 50% respiratory variability, suggesting right atrial pressure of 3 mmHg.  9. Evidence of atrial level shunting detected by color flow Doppler. There is a small patent foramen ovale with predominantly left to right shunting across the atrial septum. Conclusion(s)/Recommendation(s): No intracardiac source of embolism detected on this transthoracic study. A transesophageal echocardiogram is  recommended to exclude cardiac source of embolism if clinically indicated. FINDINGS  Left Ventricle: Left ventricular ejection fraction, by estimation, is 60 to 65%. The left ventricle has normal function. The left ventricle has no regional wall motion abnormalities. The left ventricular internal cavity size was normal in size. There is  mild concentric left ventricular hypertrophy. Left ventricular diastolic parameters are consistent with Grade I diastolic dysfunction (impaired relaxation). Right Ventricle: The right ventricular size is normal. No increase in right ventricular wall thickness. Right ventricular systolic function is normal. There is normal pulmonary artery systolic pressure. The tricuspid regurgitant velocity is 2.68 m/s, and  with an assumed right atrial pressure of 3 mmHg, the estimated right ventricular systolic pressure is 83.4 mmHg. Left Atrium: Left atrial size was mildly dilated. Right Atrium: Right atrial size was mildly dilated. Pericardium: There is no evidence of pericardial effusion. Mitral Valve: The mitral valve is degenerative in appearance. There is mild calcification of the mitral valve leaflet(s). Severe mitral annular calcification. Mild mitral valve regurgitation. Tricuspid Valve: The tricuspid valve is normal in structure. Tricuspid valve regurgitation is moderate to severe. Aortic Valve: The aortic valve is tricuspid. Aortic valve regurgitation is not visualized. Mild aortic valve sclerosis is present, with no evidence of aortic valve stenosis. Pulmonic Valve: The pulmonic valve was not well visualized. Pulmonic valve regurgitation is not visualized. Aorta: The aortic root was not well visualized and the ascending aorta was not well visualized. Venous: The  inferior vena cava is normal in size with greater than 50% respiratory variability, suggesting right atrial pressure of 3 mmHg. IAS/Shunts: Evidence of atrial level shunting detected by color flow Doppler. A small patent  foramen ovale is detected with predominantly left to right shunting across the atrial septum.  LEFT VENTRICLE PLAX 2D LVIDd:         3.60 cm  Diastology LVIDs:         2.50 cm  LV e' lateral:   3.37 cm/s LV PW:         1.30 cm  LV E/e' lateral: 32.9 LV IVS:        1.40 cm  LV e' medial:    3.05 cm/s LVOT diam:     2.00 cm  LV E/e' medial:  36.4 LV SV:         100 LV SV Index:   62 LVOT Area:     3.14 cm  RIGHT VENTRICLE            IVC RV Basal diam:  2.80 cm    IVC diam: 1.70 cm RV S prime:     9.03 cm/s TAPSE (M-mode): 2.0 cm LEFT ATRIUM           Index LA diam:      2.40 cm 1.49 cm/m LA Vol (A2C): 45.7 ml 28.42 ml/m LA Vol (A4C): 35.4 ml 22.02 ml/m  AORTIC VALVE LVOT Vmax:   125.50 cm/s LVOT Vmean:  89.900 cm/s LVOT VTI:    0.317 m  AORTA Ao Root diam: 4.00 cm MITRAL VALVE                TRICUSPID VALVE MV Area (PHT): 1.81 cm     TR Peak grad:   28.7 mmHg MV Decel Time: 419 msec     TR Vmax:        268.00 cm/s MV E velocity: 111.00 cm/s MV A velocity: 144.00 cm/s  SHUNTS MV E/A ratio:  0.77         Systemic VTI:  0.32 m                             Systemic Diam: 2.00 cm Buford Dresser MD Electronically signed by Buford Dresser MD Signature Date/Time: 10/29/2019/9:37:53 PM    Final         Scheduled Meds:   stroke: mapping our early stages of recovery book   Does not apply Once   amitriptyline  5 mg Oral QPM   aspirin EC  325 mg Oral Daily   atorvastatin  80 mg Oral Daily   clonazePAM  0.25 mg Oral QAC breakfast   clopidogrel  75 mg Oral Daily   irbesartan  37.5 mg Oral Daily   latanoprost  1 drop Both Eyes QHS   PARoxetine  20 mg Oral q AM   potassium chloride  10 mEq Oral q AM   Continuous Infusions:    LOS: 2 days    Time spent: 30 minutes    Barb Merino, MD [facility] Pager [phone]

## 2019-10-30 NOTE — NC FL2 (Addendum)
[place] LEVEL OF CARE SCREENING TOOL     IDENTIFICATION  Patient Name: Christy Waters Birthdate: 1918-11-20 Sex: female Admission Date (Current Location): 10/28/2019  [facility] and [place] Number:  Engineering geologist and Address:  [facility] [place], [place] [address], [place], [place] [address]      Provider Number: [id number]  Attending Physician Name and Address:  Barb Merino, MD  Relative Name and Phone Number:       Current Level of Care: Hospital Recommended Level of Care: [place] Prior Approval Number:     Date Approved/Denied:   PASRR Number:   [id number]  Discharge Plan: SNF    Current Diagnoses: Patient Active Problem List   Diagnosis Date Noted  . Stroke ([place]) 10/28/2019  . Hypertensive urgency 09/16/2019  . Syncope, vasovagal 09/15/2019  . Syncope and collapse 09/15/2019  . Acute lower UTI 09/15/2019  . Pelvic pain in female 03/20/2018  . Cor pulmonale ([place]) 09/19/2017  . Low serum vitamin D 06/19/2017  . History of atrial fibrillation 06/19/2017  . Chest pain 05/10/2017  . Moderate episode of recurrent major depressive disorder ([place]) 04/04/2017  . Adult idiopathic generalized osteoporosis 04/13/2016  . 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 Irritable bowel syndrome with diarrhea 05/20/2014  . Abdominal pain, generalized 04/01/2014  . Atherosclerosis of aorta ([place]) 03/05/2014  . 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 Idiopathic peripheral neuropathy 03/05/2014  . Abdominal pain of unknown cause 02/06/2014  . MI (mitral incompetence) 02/06/2014  . Stress incontinence 02/06/2014  . Fibrosis of lung ([place]) 10/03/2013  . Generalized OA 10/03/2013  . HLD (hyperlipidemia) 10/03/2013  . BP (high blood pressure) 10/03/2013  . OP (osteoporosis) 10/03/2013  . Beat, premature ventricular 10/03/2013  . Ventricular premature depolarization 10/03/2013    Orientation RESPIRATION BLADDER Height & Weight      Self, Time, Place, Situation  Normal Incontinent Weight: 119 lb 11.4 oz (54.3 kg) Height:  5\' 6"  (167.6 cm)  BEHAVIORAL SYMPTOMS/MOOD NEUROLOGICAL BOWEL NUTRITION STATUS      Incontinent Diet(heart healthy)  AMBULATORY STATUS COMMUNICATION OF NEEDS Skin   Extensive Assist Verbally Skin abrasions(left lower leg open wound, gauze and abdominal pads dressing change PRN)                       Personal Care Assistance Level of Assistance  Bathing, Feeding, Dressing Bathing Assistance: Maximum assistance Feeding assistance: Limited assistance Dressing Assistance: Maximum assistance     Functional Limitations Info  Sight, Speech, Hearing Sight Info: Adequate Hearing Info: Adequate Speech Info: Adequate    SPECIAL CARE FACTORS FREQUENCY  PT (By licensed PT), OT (By licensed OT)     PT Frequency: 5x/wk OT Frequency: 5x/wk            Contractures Contractures Info: Not present    Additional Factors Info  Code Status, Allergies, Psychotropic Code Status Info: DNR Allergies Info: Azithromycin, Clindamycin, Clindamycin/lincomycin, Hyoscyamine, Lincomycin, Maxitrol (Neomycin-polymyxin-dexameth), Other, Timolol, Codeine, Penicillins Psychotropic Info: Paxil 20mg  daily in morning, Klonopin 0.25mg  daily before breakfast         Current Medications (10/30/2019):  This is the current hospital active medication list Current Facility-Administered Medications  Medication Dose Route Frequency Provider Last Rate Last Admin  .  stroke: mapping our early stages of recovery book   Does not apply Once Clarnce Flock, MD      . acetaminophen (TYLENOL) tablet 650 mg  650 mg Oral Q4H PRN Clarnce Flock,  MD   650 mg at 10/29/19 2045   Or  . acetaminophen (TYLENOL) 160 MG/5ML solution 650 mg  650 mg Per Tube Q4H PRN Clarnce Flock, MD   650 mg at 10/28/19 2153   Or  . acetaminophen (TYLENOL) suppository 650 mg  650 mg Rectal Q4H PRN Clarnce Flock, MD      . amitriptyline  (ELAVIL) tablet 5 mg  5 mg Oral QPM Clarnce Flock, MD   5 mg at 10/29/19 1723  . aspirin EC tablet 325 mg  325 mg Oral Daily Rosalin Hawking, MD   325 mg at 10/30/19 0858  . atorvastatin (LIPITOR) tablet 80 mg  80 mg Oral Daily Clarnce Flock, MD   80 mg at 10/30/19 0858  . clonazePAM (KLONOPIN) disintegrating tablet 0.25 mg  0.25 mg Oral QAC breakfast Clarnce Flock, MD   0.25 mg at 10/30/19 0858  . clopidogrel (PLAVIX) tablet 75 mg  75 mg Oral Daily Clarnce Flock, MD   75 mg at 10/30/19 0857  . dicyclomine (BENTYL) capsule 10 mg  10 mg Oral Q6H PRN Clarnce Flock, MD      . irbesartan (AVAPRO) tablet 37.5 mg  37.5 mg Oral Daily Clarnce Flock, MD   37.5 mg at 10/30/19 0857  . latanoprost (XALATAN) 0.005 % ophthalmic solution 1 drop  1 drop Both Eyes QHS Clarnce Flock, MD   1 drop at 10/29/19 2219  . metoprolol tartrate (LOPRESSOR) injection 5 mg  5 mg Intravenous Q6H PRN Clarnce Flock, MD   2.5 mg at 10/29/19 0155  . PARoxetine (PAXIL) tablet 20 mg  20 mg Oral q AM Clarnce Flock, MD   20 mg at 10/30/19 0900  . potassium chloride (KLOR-CON) CR tablet 10 mEq  10 mEq Oral q AM Clarnce Flock, MD   10 mEq at 10/30/19 0900     Discharge Medications: Please see discharge summary for a list of discharge medications.  Relevant Imaging Results:  Relevant Lab Results:   Additional Information SS#: [id number]  Geralynn Ochs, LCSW

## 2019-10-30 NOTE — Progress Notes (Signed)
Physical Therapy Treatment Patient Details Name: Christy Waters MRN: [id number] DOB: 24-May-1919 Today's Date: 10/30/2019    History of Present Illness Pt is a 86 y.o. female with hx of HTN, HLD, Afib not on anticoagulation due to hx of falls, OA, depression, who presented from home via EMS with L sided deficits and was found to have an acute posterior right temporal lobe and parasagittal posterior R frontal lobe.  Her symptoms began on Thursday when she fell.  L LE imaging negative for acute fx.    PT Comments    Pt is making progress toward PT goals. Pt able to stand X 2 trials with +2 assist for safety and use of Stedy standing frame. Pt limited by painful L LE and maintained flexion while in standing. Noted CIR denial and family prefers SNF upon discharge. Recommend SNF for further skilled PT services to maximize independence and safety with mobility.     Follow Up Recommendations  SNF;Supervision/Assistance - 24 hour     Equipment Recommendations  Other (comment)(TBD next venue)    Recommendations for Other Services       Precautions / Restrictions Precautions Precautions: Fall Precaution Comments: L inattention, L hemi Restrictions Weight Bearing Restrictions: No    Mobility  Bed Mobility Overal bed mobility: Needs Assistance Bed Mobility: Supine to Sit     Supine to sit: Max assist     General bed mobility comments: multimodal cues for sequencing; assist to bring L LE/hips to EOB and to elevate trunk into sitting   Transfers Overall transfer level: Needs assistance   Transfers: Sit to/from [facility] Transfers Sit to Stand: Mod assist;+2 physical assistance;+2 safety/equipment         General transfer comment: pt stood X 2 trials with assistance to power up into standing; pt with L LE flexed and difficulty bearing weight due to pain; Pt able to maintain L hand grip on Stedy frame after hand over hand assist for placement   Ambulation/Gait              General Gait Details: unable   Stairs             Wheelchair Mobility    Modified Rankin (Stroke Patients Only) Modified Rankin (Stroke Patients Only) Pre-Morbid Rankin Score: Slight disability Modified Rankin: Severe disability     Balance Overall balance assessment: Needs assistance Sitting-balance support: Single extremity supported;Feet supported Sitting balance-Leahy Scale: Fair     Standing balance support: Bilateral upper extremity supported;During functional activity Standing balance-Leahy Scale: Zero                              Cognition Arousal/Alertness: Awake/alert Behavior During Therapy: WFL for tasks assessed/performed Overall Cognitive Status: Impaired/Different from baseline Area of Impairment: Problem solving;Following commands;Safety/judgement                   Current Attention Level: Sustained   Following Commands: Follows one step commands with increased time;Follows one step commands consistently Safety/Judgement: Decreased awareness of safety;Decreased awareness of deficits   Problem Solving: Slow processing;Decreased initiation;Difficulty sequencing;Requires verbal cues;Requires tactile cues        Exercises      General Comments        Pertinent Vitals/Pain Pain Assessment: Faces Faces Pain Scale: Hurts little more Pain Location: L LE  Pain Descriptors / Indicators: Grimacing;Guarding Pain Intervention(s): Monitored during session;Limited activity within patient's tolerance;Repositioned    Home Living  Prior Function            PT Goals (current goals can now be found in the care plan section) Progress towards PT goals: Progressing toward goals    Frequency    Min 3X/week      PT Plan Discharge plan needs to be updated    Co-evaluation              AM-PAC PT "6 Clicks" Mobility   Outcome Measure  Help needed turning from your back to your  side while in a flat bed without using bedrails?: A Lot Help needed moving from lying on your back to sitting on the side of a flat bed without using bedrails?: A Lot Help needed moving to and from a bed to a chair (including a wheelchair)?: A Lot Help needed standing up from a chair using your arms (e.g., wheelchair or bedside chair)?: A Lot Help needed to walk in hospital room?: Total Help needed climbing 3-5 steps with a railing? : Total 6 Click Score: 10    End of Session Equipment Utilized During Treatment: Gait belt Activity Tolerance: Patient tolerated treatment well Patient left: in chair;with call bell/phone within reach;with chair alarm set;with family/visitor present Nurse Communication: Mobility status PT Visit Diagnosis: Other abnormalities of gait and mobility (R26.89);Muscle weakness (generalized) (M62.81);Other symptoms and signs involving the nervous system (R29.898)     Time: [id number] PT Time Calculation (min) (ACUTE ONLY): 36 min  Charges:  $Therapeutic Activity: 23-37 mins                     Earney Navy, PTA Acute Rehabilitation Services Pager: [phone] Office: [phone]     Darliss Cheney 10/30/2019, 4:31 PM

## 2019-10-31 MED ORDER — CLONAZEPAM 0.25 MG PO TBDP: 0.2500 mg | ORAL_TABLET | Freq: Every day | ORAL | 0 refills | Status: DC

## 2019-10-31 MED ORDER — ATORVASTATIN CALCIUM 80 MG PO TABS: 80.0000 mg | ORAL_TABLET | Freq: Every day | ORAL | 2 refills | Status: DC

## 2019-10-31 MED ORDER — ASPIRIN EC 81 MG PO TBEC: 325.0000 mg | DELAYED_RELEASE_TABLET | Freq: Every day | ORAL | Status: DC

## 2019-10-31 MED ORDER — POTASSIUM CHLORIDE CRYS ER 20 MEQ PO TBCR: 10.0000 meq | EXTENDED_RELEASE_TABLET | Freq: Every day | ORAL | Status: DC

## 2019-10-31 MED ORDER — CLOPIDOGREL BISULFATE 75 MG PO TABS: 75.0000 mg | ORAL_TABLET | Freq: Every day | ORAL | 2 refills | Status: AC

## 2019-10-31 NOTE — TOC Progression Note (Signed)
Transition of Care [facility]) - Progression Note    Patient Details  Name: Christy Waters MRN: [id number] Date of Birth: [birth year]  Transition of [facility]) CM/SW [place], [place] Phone Number: 10/31/2019, 4:40 PM  Clinical Narrative:   CSW received update from family that they would prefer either [facility] or [facility]. CSW secured bed offers from both, and family chose [facility]. CSW updated [facility] Medicare with facility choice, and Josem Kaufmann is still pending at this time. CSW obtained copy of patient's social security card and sent to [facility] for review to correct PASRR system. CSW to follow.    Expected Discharge Plan: Skilled Nursing Facility Barriers to Discharge: [place] (PASRR), Continued Medical Work up, Orthoptist and Services Expected Discharge Plan: [place] Choice: [place] arrangements for the past 2 months: Single Family Home                                       Social Determinants of Health (SDOH) Interventions    Readmission Risk Interventions No flowsheet data found.

## 2019-10-31 NOTE — Progress Notes (Signed)
PROGRESS NOTE    Christy Waters  [id number] DOB: [birth year] DOA: 10/28/2019 PCP: Rusty Aus, MD    Brief Narrative:  86 year old female with history of hypertension, hyperlipidemia, chronic A. fib not on anticoagulation due to history of falls, osteoarthritis who lives at home with 24 hours rotating caregivers presented to the hospital with left-sided hemiplegia and found to have acute stroke on imaging.  She probably had symptoms ongoing for last few days as she was noted to be weak on the left side and fell last week but declined to come to the hospital. In the emergency room left hemiplegia, CT scan and MRI consistent with right ACA and MCA stroke.   Assessment & Plan:   Active Problems:   HLD (hyperlipidemia)   BP (high blood pressure)   MI (mitral incompetence)   OP (osteoporosis)   Moderate episode of recurrent major depressive disorder (HCC)   History of atrial fibrillation   Stroke ([place])  Acute ischemic stroke, right ACA and MCA stroke: Clinical findings, unable to move left side of the body including left arm and left leg. CT head findings, acute infarction posterior right temporal lobe, smaller acute infarction on the parasagittal posterior right frontal lobe. MRI of the brain, multiple acute infarcts in the right MCA, distal ACA and watershed territories. CTA of the head and neck, no large vessel occlusion.  Atherosclerotic disease and intracranial stenosis. 2D echocardiogram, no evidence of intracardiac thrombus.  Ejection fraction normal. Antiplatelets, does have history of A. fib, not on anticoagulation due to fall risk and bleeding risk.  Neurology recommended dual antiplatelet therapy.  Started. LDL, 280.  started on high intensity statin.  She is 86 year old, hopefully statin will help. Hemoglobin A1c, 5.1.  No indication for treatment. Therapy recommendations, acute inpatient rehab. CIR recommended to SNF.  Waiting for bed availability.  Essential  hypertension: Stable on home medications.  Paroxysmal atrial fibrillation: Currently in sinus rhythm.  Unable to anticoagulate because of fall risk, side effects outweigh benefits.  Depression: On SSRI and amitriptyline that she will continue.  Hypokalemia: Replaced .  On a scheduled replacement.  DVT prophylaxis: SCD Code Status: DNR Family Communication: Met with son at bedside.  6/1. Disposition Plan: Status is: Inpatient  Remains inpatient appropriate because:Inpatient level of care appropriate due to severity of illness.  Is ready to go to skilled nursing rehab.   Dispo: The patient is from: Home              Anticipated d/c is to: SNF              Anticipated d/c date is: 1 day              Patient currently is medically stable to go to a skilled level of care.    Consultants:   Neurology  Procedures:   None  Antimicrobials:   None   Subjective: Patient seen and examined.  No overnight events.  "I am as good as I could be".  She thinks he can use her left hand more today. Objective: Vitals:   10/30/19 1307 10/30/19 1654 10/30/19 1956 10/30/19 2331  BP: (!) 141/82 (!) 156/104 (!) 155/80 (!) 168/90  Pulse: 77 91 84 82  Resp: '18 16 20 18  ' Temp: 97.8 F (36.6 C) 98.1 F (36.7 C) 98 F (36.7 C) 98.3 F (36.8 C)  TempSrc: Oral Oral Oral Oral  SpO2: 98% 93% 99% 97%  Weight:      Height:  Intake/Output Summary (Last 24 hours) at 10/31/2019 1032 Last data filed at 10/31/2019 0233 Gross per 24 hour  Intake --  Output 1400 ml  Net -1400 ml   Filed Weights   10/28/19 0918  Weight: 54.3 kg    Examination:  Physical Exam  Constitutional: She is oriented to person, place, and time.  Comfortable, age-appropriate.  Eating lunch.  HENT:  Head: Normocephalic.  Eyes: Pupils are equal, round, and reactive to light.  Cardiovascular: Regular rhythm.  Musculoskeletal:     Cervical back: Neck supple.  Neurological: She is alert and oriented to person,  place, and time. No cranial nerve deficit.  Left upper extremity 3/5, improved than before. Left lower extremity 2/5, improved than before.  Skin:  Skin lacerations left foot.      Data Reviewed: I have personally reviewed following labs and imaging studies  CBC: Recent Labs  Lab 10/28/19 0918 10/28/19 0924 10/29/19 0455  WBC 6.8  --  4.7  NEUTROABS 5.8  --   --   HGB 12.9 12.2 11.1*  HCT 38.5 36.0 34.1*  MCV 96.5  --  98.3  PLT 233  --  435   Basic Metabolic Panel: Recent Labs  Lab 10/28/19 0918 10/28/19 0924 10/29/19 0455  NA 142 143 142  K 3.9 4.0 3.1*  CL 106 121* 106  CO2 26  --  28  GLUCOSE 136* 137* 106*  BUN '9 10 11  ' CREATININE 0.75 0.80 0.72  CALCIUM 9.3  --  8.7*   GFR: Estimated Creatinine Clearance: 33.7 mL/min (by C-G formula based on SCr of 0.72 mg/dL). Liver Function Tests: Recent Labs  Lab 10/28/19 0918 10/29/19 0455  AST 26 22  ALT 21 16  ALKPHOS 148* 120  BILITOT 0.9 0.9  PROT 7.4 6.5  ALBUMIN 3.8 3.3*   No results for input(s): LIPASE, AMYLASE in the last 168 hours. No results for input(s): AMMONIA in the last 168 hours. Coagulation Profile: Recent Labs  Lab 10/28/19 0918  INR 1.0   Cardiac Enzymes: No results for input(s): CKTOTAL, CKMB, CKMBINDEX, TROPONINI in the last 168 hours. BNP (last 3 results) No results for input(s): PROBNP in the last 8760 hours. HbA1C: No results for input(s): HGBA1C in the last 72 hours. CBG: Recent Labs  Lab 10/28/19 0915  GLUCAP 126*   Lipid Profile: No results for input(s): CHOL, HDL, LDLCALC, TRIG, CHOLHDL, LDLDIRECT in the last 72 hours. Thyroid Function Tests: No results for input(s): TSH, T4TOTAL, FREET4, T3FREE, THYROIDAB in the last 72 hours. Anemia Panel: No results for input(s): VITAMINB12, FOLATE, FERRITIN, TIBC, IRON, RETICCTPCT in the last 72 hours. Sepsis Labs: No results for input(s): PROCALCITON, LATICACIDVEN in the last 168 hours.  Recent Results (from the past 240  hour(s))  SARS Coronavirus 2 by RT PCR (hospital order, performed in [facility] hospital lab) Nasopharyngeal Nasopharyngeal Swab     Status: None   Collection Time: 10/28/19 11:58 AM   Specimen: Nasopharyngeal Swab  Result Value Ref Range Status   SARS Coronavirus 2 NEGATIVE NEGATIVE Final    Comment: (NOTE) SARS-CoV-2 target nucleic acids are NOT DETECTED. The SARS-CoV-2 RNA is generally detectable in upper and lower respiratory specimens during the acute phase of infection. The lowest concentration of SARS-CoV-2 viral copies this assay can detect is 250 copies / mL. A negative result does not preclude SARS-CoV-2 infection and should not be used as the sole basis for treatment or other patient management decisions.  A negative result may occur with improper  specimen collection / handling, submission of specimen other than nasopharyngeal swab, presence of viral mutation(s) within the areas targeted by this assay, and inadequate number of viral copies (<250 copies / mL). A negative result must be combined with clinical observations, patient history, and epidemiological information. Fact Sheet for Patients:   StrictlyIdeas.no Fact Sheet for Healthcare Providers: BankingDealers.co.za This test is not yet approved or cleared  by the [place] FDA and has been authorized for detection and/or diagnosis of SARS-CoV-2 by FDA under an Emergency Use Authorization (EUA).  This EUA will remain in effect (meaning this test can be used) for the duration of the COVID-19 declaration under Section 564(b)(1) of the Act, 21 U.S.C. section 360bbb-3(b)(1), unless the authorization is terminated or revoked sooner. Performed at [facility], [place] [address], [place], [place] [address]   Culture, Urine     Status: Abnormal   Collection Time: 10/29/19  4:42 AM   Specimen: Urine, Clean Catch  Result Value Ref Range Status   Specimen Description  URINE, CLEAN CATCH  Final   Special Requests   Final    NONE Performed at [facility], [place] [address], [place], [place] [address]    Culture MULTIPLE SPECIES PRESENT, SUGGEST RECOLLECTION (A)  Final   Report Status 10/30/2019 FINAL  Final         Radiology Studies: ECHOCARDIOGRAM COMPLETE  Result Date: 10/29/2019    ECHOCARDIOGRAM REPORT   Patient Name:   [facility] Date of Exam: 10/29/2019 Medical Rec #:  [id number]               Height:       66.0 in Accession #:    [id number]              Weight:       119.7 lb Date of Birth:  04-30-1919               BSA:          1.608 m Patient Age:    60 years                BP:           154/98 mmHg Patient Gender: F                       HR:           62 bpm. Exam Location:  Inpatient Procedure: 2D Echo, Cardiac Doppler and Color Doppler Indications:    Stroke 434.91 / I163.9  History:        Patient has prior history of Echocardiogram examinations, most                 recent 09/17/2019. Previous Myocardial Infarction, COPD,                 Arrythmias:Atrial Fibrillation; Risk Factors:Hypertension and                 Dyslipidemia. Cancer.  Sonographer:    Jonelle Sidle Dance Referring Phys: [id number] Lamont M ECKSTAT IMPRESSIONS  1. Left ventricular ejection fraction, by estimation, is 60 to 65%. The left ventricle has normal function. The left ventricle has no regional wall motion abnormalities. There is mild concentric left ventricular hypertrophy. Left ventricular diastolic parameters are consistent with Grade I diastolic dysfunction (impaired relaxation).  2. Right ventricular systolic function is normal. The right ventricular size is normal. There is normal pulmonary artery systolic pressure.  3. Left atrial size was mildly dilated.  4. Right atrial size was mildly dilated.  5. The mitral valve is degenerative. Mild mitral valve regurgitation.  6. Tricuspid valve regurgitation is moderate to severe.  7. The aortic valve is tricuspid. Aortic  valve regurgitation is not visualized. Mild aortic valve sclerosis is present, with no evidence of aortic valve stenosis.  8. The inferior vena cava is normal in size with greater than 50% respiratory variability, suggesting right atrial pressure of 3 mmHg.  9. Evidence of atrial level shunting detected by color flow Doppler. There is a small patent foramen ovale with predominantly left to right shunting across the atrial septum. Conclusion(s)/Recommendation(s): No intracardiac source of embolism detected on this transthoracic study. A transesophageal echocardiogram is recommended to exclude cardiac source of embolism if clinically indicated. FINDINGS  Left Ventricle: Left ventricular ejection fraction, by estimation, is 60 to 65%. The left ventricle has normal function. The left ventricle has no regional wall motion abnormalities. The left ventricular internal cavity size was normal in size. There is  mild concentric left ventricular hypertrophy. Left ventricular diastolic parameters are consistent with Grade I diastolic dysfunction (impaired relaxation). Right Ventricle: The right ventricular size is normal. No increase in right ventricular wall thickness. Right ventricular systolic function is normal. There is normal pulmonary artery systolic pressure. The tricuspid regurgitant velocity is 2.68 m/s, and  with an assumed right atrial pressure of 3 mmHg, the estimated right ventricular systolic pressure is 38.4 mmHg. Left Atrium: Left atrial size was mildly dilated. Right Atrium: Right atrial size was mildly dilated. Pericardium: There is no evidence of pericardial effusion. Mitral Valve: The mitral valve is degenerative in appearance. There is mild calcification of the mitral valve leaflet(s). Severe mitral annular calcification. Mild mitral valve regurgitation. Tricuspid Valve: The tricuspid valve is normal in structure. Tricuspid valve regurgitation is moderate to severe. Aortic Valve: The aortic valve is  tricuspid. Aortic valve regurgitation is not visualized. Mild aortic valve sclerosis is present, with no evidence of aortic valve stenosis. Pulmonic Valve: The pulmonic valve was not well visualized. Pulmonic valve regurgitation is not visualized. Aorta: The aortic root was not well visualized and the ascending aorta was not well visualized. Venous: The inferior vena cava is normal in size with greater than 50% respiratory variability, suggesting right atrial pressure of 3 mmHg. IAS/Shunts: Evidence of atrial level shunting detected by color flow Doppler. A small patent foramen ovale is detected with predominantly left to right shunting across the atrial septum.  LEFT VENTRICLE PLAX 2D LVIDd:         3.60 cm  Diastology LVIDs:         2.50 cm  LV e' lateral:   3.37 cm/s LV PW:         1.30 cm  LV E/e' lateral: 32.9 LV IVS:        1.40 cm  LV e' medial:    3.05 cm/s LVOT diam:     2.00 cm  LV E/e' medial:  36.4 LV SV:         100 LV SV Index:   62 LVOT Area:     3.14 cm  RIGHT VENTRICLE            IVC RV Basal diam:  2.80 cm    IVC diam: 1.70 cm RV S prime:     9.03 cm/s TAPSE (M-mode): 2.0 cm LEFT ATRIUM           Index LA diam:  2.40 cm 1.49 cm/m LA Vol (A2C): 45.7 ml 28.42 ml/m LA Vol (A4C): 35.4 ml 22.02 ml/m  AORTIC VALVE LVOT Vmax:   125.50 cm/s LVOT Vmean:  89.900 cm/s LVOT VTI:    0.317 m  AORTA Ao Root diam: 4.00 cm MITRAL VALVE                TRICUSPID VALVE MV Area (PHT): 1.81 cm     TR Peak grad:   28.7 mmHg MV Decel Time: 419 msec     TR Vmax:        268.00 cm/s MV E velocity: 111.00 cm/s MV A velocity: 144.00 cm/s  SHUNTS MV E/A ratio:  0.77         Systemic VTI:  0.32 m                             Systemic Diam: 2.00 cm Buford Dresser MD Electronically signed by Buford Dresser MD Signature Date/Time: 10/29/2019/9:37:53 PM    Final         Scheduled Meds: . amitriptyline  5 mg Oral QPM  . aspirin EC  325 mg Oral Daily  . atorvastatin  80 mg Oral Daily  . clonazePAM  0.25  mg Oral QAC breakfast  . clopidogrel  75 mg Oral Daily  . irbesartan  37.5 mg Oral Daily  . latanoprost  1 drop Both Eyes QHS  . PARoxetine  20 mg Oral q AM  . potassium chloride  10 mEq Oral q AM   Continuous Infusions:    LOS: 3 days    Time spent: 30 minutes    Barb Merino, MD [facility] Pager [phone]

## 2019-10-31 NOTE — Progress Notes (Signed)
Occupational Therapy Treatment Patient Details Name: Christy Waters MRN: [id number] DOB: Mar 11, 1919 Today's Date: 10/31/2019    History of present illness Pt is a 86 y.o. female with hx of HTN, HLD, Afib not on anticoagulation due to hx of falls, OA, depression, who presented from home via EMS with L sided deficits and was found to have an acute posterior right temporal lobe and parasagittal posterior R frontal lobe.  Her symptoms began on Thursday when she fell.  L LE imaging negative for acute fx.   OT comments  Treatment session with focus on Lt attention and awareness during self-care tasks and functional mobility.  Engaged in bed mobility with multimodal cues for sequencing to increase participation in bed mobility and awareness of Lt body during mobility.  Mod assist to advance LLE to EOB.  Stand pivot/scoot to recliner with max assist +2 due to pt with difficulty sequencing mobility as well as LLE in hyperextension and decreased weight bearing through RLE.  Noted CIR denial and family preference for SNF upon d/c, therefore changed d/c recommendation to SNF.  Pt will continue to benefit from OT acutely to increase independence with ADLs and functional mobility to prepare to d/c to next venue of care.  Follow Up Recommendations  SNF;Supervision/Assistance - 24 hour    Equipment Recommendations  Other (comment)(defer to next venue of care)       Precautions / Restrictions Precautions Precautions: Fall Precaution Comments: L inattention, L hemi Restrictions Weight Bearing Restrictions: No       Mobility Bed Mobility Overal bed mobility: Needs Assistance Bed Mobility: Supine to Sit     Supine to sit: Mod assist     General bed mobility comments: multimodal cues for sequencing; assist to bring L LE/hips to EOB and to elevate trunk into sitting   Transfers Overall transfer level: Needs assistance Equipment used: 2 person hand held assist Transfers: Sit to/from  Stand;Stand Pivot Transfers Sit to Stand: Max assist;+2 physical assistance Stand pivot transfers: Max assist;+2 physical assistance       General transfer comment: Pt required max +2 for lateral scoot/partial stand pivot transfer to recliner.  Pt demonstrating hyperextension in LLE and decreased weight bearing through RLE during transfers requiring increased assist for safety.    Balance Overall balance assessment: Needs assistance Sitting-balance support: Single extremity supported;Feet supported Sitting balance-Leahy Scale: Good Sitting balance - Comments: min assist to min guard for sitting at EOB                                   ADL either performed or assessed with clinical judgement   ADL Overall ADL's : Needs assistance/impaired     Grooming: Minimal assistance;Sitting;Wash/dry face;Brushing hair Grooming Details (indicate cue type and reason): Therapist set up items on Lt to facilitate visual scanning and attention to Lt environment.  Pt able to wash face and comb hair, but decreased functional use of L hand                 Toilet Transfer: Maximal assistance;+2 for safety/equipment;+2 for physical assistance;Squat-pivot Toilet Transfer Details (indicate cue type and reason): simulated to recliner         Functional mobility during ADLs: Maximal assistance;+2 for physical assistance;+2 for safety/equipment;Moderate assistance;Cueing for safety;Cueing for sequencing General ADL Comments: Pt demonstrates hyperextension on LLE in standing and during transfers, requiring max +2 for lateral scoot transfer.  Able to maintain sitting balance  CGA on EOB.     Vision Baseline Vision/History: Wears glasses Wears Glasses: At all times Patient Visual Report: No change from baseline(glasses not present) Vision Assessment?: Yes;Vision impaired- to be further tested in functional context Alignment/Gaze Preference: Gaze right(preference, but able to locate items  in Lt visual fiedl) Tracking/Visual Pursuits: Able to track stimulus in all quads without difficulty;Other (comment)(increased time to track towards stimulus on Lt) Visual Fields: Left visual field deficit          Cognition Arousal/Alertness: Awake/alert Behavior During Therapy: WFL for tasks assessed/performed Overall Cognitive Status: Impaired/Different from baseline Area of Impairment: Problem solving;Following commands;Safety/judgement                   Current Attention Level: Sustained   Following Commands: Follows one step commands with increased time;Follows one step commands consistently Safety/Judgement: Decreased awareness of safety;Decreased awareness of deficits   Problem Solving: Slow processing;Decreased initiation;Difficulty sequencing;Requires verbal cues;Requires tactile cues                     Pertinent Vitals/ Pain       Pain Assessment: Faces Faces Pain Scale: Hurts little more Pain Location: L LE  Pain Descriptors / Indicators: Grimacing;Guarding Pain Intervention(s): Monitored during session;Limited activity within patient's tolerance;Repositioned         Frequency  Min 2X/week        Progress Toward Goals  OT Goals(current goals can now be found in the care plan section)  Progress towards OT goals: Progressing toward goals  Acute Rehab OT Goals Patient Stated Goal: to go home OT Goal Formulation: With patient Time For Goal Achievement: 11/12/19 Potential to Achieve Goals: [place] Discharge plan needs to be updated    Co-evaluation    PT/OT/SLP Co-Evaluation/Treatment: Yes Reason for Co-Treatment: For patient/therapist safety;To address functional/ADL transfers   OT goals addressed during session: ADL's and self-care      AM-PAC OT "6 Clicks" Daily Activity     Outcome Measure   Help from another person eating meals?: A Little Help from another person taking care of personal grooming?: A Little Help from another  person toileting, which includes using toliet, bedpan, or urinal?: Total Help from another person bathing (including washing, rinsing, drying)?: A Lot Help from another person to put on and taking off regular upper body clothing?: A Lot Help from another person to put on and taking off regular lower body clothing?: Total 6 Click Score: 12    End of Session Equipment Utilized During Treatment: Gait belt  OT Visit Diagnosis: Other abnormalities of gait and mobility (R26.89);Muscle weakness (generalized) (M62.81);Pain;Other symptoms and signs involving cognitive function;Hemiplegia and hemiparesis;Low vision, both eyes (H54.2);History of falling (Z91.81) Hemiplegia - Right/Left: Left Hemiplegia - dominant/non-dominant: Non-Dominant Hemiplegia - caused by: Cerebral infarction Pain - Right/Left: Left Pain - part of body: Leg;Ankle and joints of foot   Activity Tolerance Patient tolerated treatment well   Patient Left with call bell/phone within reach;in chair;with family/visitor present;with chair alarm set   Nurse Communication Mobility status        Time: [id number] OT Time Calculation (min): 34 min  Charges: OT General Charges $OT Visit: 1 Visit OT Treatments $Self Care/Home Management : 8-22 mins    Simonne Come [phone] 10/31/2019, 10:23 AM

## 2019-10-31 NOTE — Plan of Care (Signed)
  Problem: Education: Goal: Knowledge of disease or condition will improve Outcome: Progressing   

## 2019-10-31 NOTE — Plan of Care (Signed)
  Problem: Intracerebral Hemorrhage Tissue Perfusion: Goal: Complications of Intracerebral Hemorrhage will be minimized Outcome: Progressing   Problem: Ischemic Stroke/TIA Tissue Perfusion: Goal: Complications of ischemic stroke/TIA will be minimized Outcome: Progressing   Problem: Spontaneous Subarachnoid Hemorrhage Tissue Perfusion: Goal: Complications of Spontaneous Subarachnoid Hemorrhage will be minimized Outcome: Progressing

## 2019-10-31 NOTE — Progress Notes (Signed)
Physical Therapy Treatment Patient Details Name: Christy Waters MRN: [id number] DOB: 1918-12-23 Today's Date: 10/31/2019    History of Present Illness Pt is a 86 y.o. female with hx of HTN, HLD, Afib not on anticoagulation due to hx of falls, OA, depression, who presented from home via EMS with L sided deficits and was found to have an acute posterior right temporal lobe and parasagittal posterior R frontal lobe.  Her symptoms began on Thursday when she fell.  L LE imaging negative for acute fx.    PT Comments    Patient is making progress toward PT goals and tolerated session well. Pt continues to require +2 assist for functional transfer training OOB. Pt will continue to benefit from further skilled PT services in both acute and post acute settings to maximize independence and safety with mobility.    Follow Up Recommendations  SNF;Supervision/Assistance - 24 hour     Equipment Recommendations  Other (comment)(TBD next venue)    Recommendations for Other Services Rehab consult     Precautions / Restrictions Precautions Precautions: Fall Precaution Comments: L inattention, L hemi Restrictions Weight Bearing Restrictions: No    Mobility  Bed Mobility Overal bed mobility: Needs Assistance Bed Mobility: Supine to Sit     Supine to sit: Mod assist     General bed mobility comments: multimodal cues for sequencing; assist to bring L LE/hips to EOB and to elevate trunk into sitting   Transfers Overall transfer level: Needs assistance Equipment used: 2 person hand held assist Transfers: Sit to/from Stand;Stand Pivot Transfers Sit to Stand: Max assist;+2 physical assistance Stand pivot transfers: Max assist;+2 physical assistance       General transfer comment: pt able to stand and pivot with max A +2 to power up, for balance, and weight shifting; pt unable to pivot feet without assistance as bilat LE locked in extension  Ambulation/Gait             General  Gait Details: unable   Stairs             Wheelchair Mobility    Modified Rankin (Stroke Patients Only) Modified Rankin (Stroke Patients Only) Pre-Morbid Rankin Score: Slight disability Modified Rankin: Severe disability     Balance Overall balance assessment: Needs assistance Sitting-balance support: Single extremity supported;Feet supported Sitting balance-Leahy Scale: Fair Sitting balance - Comments: min assist to min guard for sitting at EOB   Standing balance support: Bilateral upper extremity supported;During functional activity Standing balance-Leahy Scale: Zero                              Cognition Arousal/Alertness: Awake/alert Behavior During Therapy: WFL for tasks assessed/performed Overall Cognitive Status: Impaired/Different from baseline Area of Impairment: Problem solving;Following commands;Safety/judgement                   Current Attention Level: Sustained   Following Commands: Follows one step commands with increased time;Follows one step commands consistently Safety/Judgement: Decreased awareness of safety;Decreased awareness of deficits   Problem Solving: Slow processing;Decreased initiation;Difficulty sequencing;Requires verbal cues;Requires tactile cues        Exercises Other Exercises Other Exercises: PROM/AAROM L LE     General Comments        Pertinent Vitals/Pain Pain Assessment: Faces Faces Pain Scale: Hurts little more Pain Location: L LE  Pain Descriptors / Indicators: Grimacing;Guarding Pain Intervention(s): Limited activity within patient's tolerance;Monitored during session;Repositioned    Home Living  Prior Function            PT Goals (current goals can now be found in the care plan section) Acute Rehab PT Goals Patient Stated Goal: to go home Progress towards PT goals: Progressing toward goals    Frequency    Min 3X/week      PT Plan Discharge plan  needs to be updated    Co-evaluation PT/OT/SLP Co-Evaluation/Treatment: Yes Reason for Co-Treatment: For patient/therapist safety;To address functional/ADL transfers PT goals addressed during session: Mobility/safety with mobility OT goals addressed during session: ADL's and self-care      AM-PAC PT "6 Clicks" Mobility   Outcome Measure  Help needed turning from your back to your side while in a flat bed without using bedrails?: A Lot Help needed moving from lying on your back to sitting on the side of a flat bed without using bedrails?: A Lot Help needed moving to and from a bed to a chair (including a wheelchair)?: A Lot Help needed standing up from a chair using your arms (e.g., wheelchair or bedside chair)?: A Lot Help needed to walk in hospital room?: Total Help needed climbing 3-5 steps with a railing? : Total 6 Click Score: 10    End of Session Equipment Utilized During Treatment: Gait belt Activity Tolerance: Patient tolerated treatment well Patient left: in chair;with call bell/phone within reach;with chair alarm set;with family/visitor present Nurse Communication: Mobility status PT Visit Diagnosis: Other abnormalities of gait and mobility (R26.89);Muscle weakness (generalized) (M62.81);Other symptoms and signs involving the nervous system [id number])     Time: [id number] PT Time Calculation (min) (ACUTE ONLY): 34 min  Charges:  $Therapeutic Activity: 8-22 mins                     Earney Navy, PTA Acute Rehabilitation Services Pager: [phone] Office: [phone]     Darliss Cheney 10/31/2019, 1:39 PM

## 2019-10-31 NOTE — Plan of Care (Signed)
  Problem: Safety: Goal: Ability to remain free from injury will improve Outcome: Progressing   

## 2019-11-01 MED ORDER — HYDRALAZINE HCL 10 MG PO TABS: 10.0000 mg | ORAL_TABLET | Freq: Four times a day (QID) | ORAL | Status: DC | PRN

## 2019-11-01 NOTE — TOC Transition Note (Signed)
Transition of Care [facility]) - CM/SW Discharge Note   Patient Details  Name: Suha Schoenbeck MRN: [id number] Date of Birth: [birth year]  [facility] CM/SW Contact:  Geralynn Ochs, LCSW Phone Number: 11/01/2019, 11:44 AM   Clinical Narrative:   Nurse to call report to [phone], Room 205    Final next level of care: Skilled Nursing Facility Barriers to Discharge: Barriers Resolved   Patient Goals and CMS Choice Patient states their goals for this hospitalization and ongoing recovery are:: to get rehab and back home CMS Medicare.gov Compare Post Acute Care list provided to:: Patient Represenative (must comment) Choice offered to / list presented to : Adult Children  Discharge Placement              Patient chooses bed at: [facility] Patient to be transferred to facility by: [facility] Name of family member notified: Lovey Newcomer, Jan Patient and family notified of of transfer: 11/01/19  Discharge Plan and Services     Post Acute Care Choice: [place]                               Social Determinants of Health (SDOH) Interventions     Readmission Risk Interventions No flowsheet data found.

## 2019-11-01 NOTE — Plan of Care (Signed)
  Problem: Education: Goal: Knowledge of General Education information will improve Description: Including pain rating scale, medication(s)/side effects and non-pharmacologic comfort measures Outcome: Completed/Met

## 2019-11-01 NOTE — Discharge Summary (Signed)
Physician Discharge Summary  Christy Waters NOM:[id number] DOB: 07-26-1918 DOA: 10/28/2019  PCP: Rusty Aus, MD  Admit date: 10/28/2019 Discharge date: 11/01/2019  Admitted From: Home Disposition: Skilled nursing facility  Recommendations for Outpatient Follow-up:  1. Follow up with PCP in 1-2 weeks 2. Please obtain BMP/CBC in one week   Discharge Condition: Fair CODE STATUS: DNR Diet recommendation: Regular diet, low salt and heart healthy diet.  Discharge summary: 86 year old female with history of hypertension, hyperlipidemia, chronic A. fib not on anticoagulation due to history of falls, osteoarthritis who lives at home with 24 hours with rotating caregivers presented to the hospital with left-sided hemiplegia and found to have acute stroke on imaging.  She probably had symptoms ongoing for last few days as she was noted to be weak on the left side and fell last week but declined to come to the hospital. In the emergency room left hemiplegia, CT scan and MRI consistent with right ACA and MCA stroke.  #1 acute ischemic stroke, right ACA and MCA stroke: Presented with unable to move left side of the body including left arm and leg.   CT head findings, acute infarction posterior right temporal lobe, smaller acute infarction on the parasagittal posterior right frontal lobe. MRI of the brain, multiple acute infarcts in the right MCA, distal ACA and watershed territories. CTA of the head and neck, no large vessel occlusion.  Atherosclerotic disease and intracranial stenosis. 2D echocardiogram, no evidence of intracardiac thrombus.  Ejection fraction normal. Antiplatelets, does have history of A. fib, not on anticoagulation due to fall risk and bleeding risk.  Neurology recommended dual antiplatelet therapy.  Started.  We will continue aspirin and Plavix as much as she tolerated. LDL, 280.  started on high intensity statin.  She is 86 year old, hopefully statin will  help. Hemoglobin A1c, 5.1.  No indication for treatment. Outpatient neurology follow-up.  #2 essential hypertension:  Blood pressure is elevated.  Resumed home medications.  Also available as needed antihypertensives for blood pressure more than 160.  Goal systolic blood pressure less than 160.  #3 paroxysmal atrial fibrillation: Currently in sinus rhythm.  Unable to anticoagulate because of fall risk, side effects outweigh benefits.  #4 depression: On SSRI and amitriptyline that she will continue.  #5 hypokalemia: Replaced .  On a scheduled replacement  Patient is clinically stable to transfer to skilled level of care.  Discharge Diagnoses:  Active Problems:   HLD (hyperlipidemia)   BP (high blood pressure)   MI (mitral incompetence)   OP (osteoporosis)   Moderate episode of recurrent major depressive disorder (HCC)   History of atrial fibrillation   Stroke [facility])    Discharge Instructions  Discharge Instructions    Diet - low sodium heart healthy   Complete by: As directed    Discharge wound care:   Complete by: As directed    Cover the wound with dry dressing.  Keep pressure off the leg wounds.   Increase activity slowly   Complete by: As directed      Allergies as of 11/01/2019      Reactions   Azithromycin Shortness Of Breath, Other (See Comments)   Syncope Can not stand up, feels very sick, weak Syncope Syncope Can not stand up, feels very sick, weak Syncope Syncope Can not stand up, feels very sick, weak Syncope Can not stand up, feels very sick, weak Syncope   Clindamycin Other (See Comments)   GI problem  GI problem  GI problem  Clindamycin/lincomycin    Hyoscyamine Diarrhea   Lincomycin Other (See Comments)   Maxitrol [neomycin-polymyxin-dexameth]    Eye irritation   Other Other (See Comments)   Eye irritation Eye irritation Eye irritation   Timolol Other (See Comments)   Eye irritation Eye irritation Eye irritation Eye irritation    Codeine Other (See Comments), Nausea Only   Altered mental status FATIGUE, SICK FEELING Altered mental status Altered mental status FATIGUE, SICK FEELING   Penicillins Rash   Has patient had a PCN reaction causing immediate rash, facial/tongue/throat swelling, SOB or lightheadedness with hypotension: Yes Has patient had a PCN reaction causing severe rash involving mucus membranes or skin necrosis: No Has patient had a PCN reaction that required hospitalization: No Has patient had a PCN reaction occurring within the last 10 years: No If all of the above answers are "NO", then may proceed with Cephalosporin use.      Medication List    TAKE these medications   Align 4 MG Caps Take 1 capsule by mouth in the morning.   amitriptyline 10 MG tablet Commonly known as: ELAVIL Take 5 mg by mouth every evening.   aspirin EC 81 MG tablet Take 4 tablets (325 mg total) by mouth daily.   atorvastatin 80 MG tablet Commonly known as: LIPITOR Take 1 tablet (80 mg total) by mouth daily.   clonazePAM 0.25 MG disintegrating tablet Commonly known as: KLONOPIN Take 1 tablet (0.25 mg total) by mouth daily before breakfast for 5 days.   clopidogrel 75 MG tablet Commonly known as: PLAVIX Take 1 tablet (75 mg total) by mouth daily.   dicyclomine 10 MG capsule Commonly known as: BENTYL Take 10 mg by mouth every 6 (six) hours as needed (abdominal pain/cramping).   feeding supplement (ENSURE ENLIVE) Liqd Take 237 mLs by mouth 2 (two) times daily between meals.   hydrALAZINE 10 MG tablet Commonly known as: APRESOLINE Take 1 tablet (10 mg total) by mouth every 6 (six) hours as needed (for SBP > 160 and DBP > 110).   Lumigan 0.01 % Soln Generic drug: bimatoprost Place 1 drop into both eyes at bedtime.   olmesartan 20 MG tablet Commonly known as: BENICAR Take 5 mg by mouth at bedtime.   PARoxetine 20 MG tablet Commonly known as: PAXIL Take 20 mg by mouth in the morning. With food    potassium chloride SA 20 MEQ tablet Commonly known as: KLOR-CON Take 0.5 tablets (10 mEq total) by mouth daily. With food What changed:   medication strength  when to take this   Vitamin D3 50 MCG (2000 UT) Tabs Take 2,000 Units by mouth every evening. With food            Discharge Care Instructions  (From admission, onward)         Start     Ordered   11/01/19 0000  Discharge wound care:    Comments: Cover the wound with dry dressing.  Keep pressure off the leg wounds.   11/01/19 1052         Follow-up Information    Benay Spice, PA-C. Schedule an appointment as soon as possible for a visit in 4 week(s).   Specialty: Neurology Contact information: [address] [place] [address] [phone]          Allergies  Allergen Reactions  . Azithromycin Shortness Of Breath and Other (See Comments)    Syncope Can not stand up, feels very sick, weak Syncope Syncope Can not  stand up, feels very sick, weak Syncope Syncope Can not stand up, feels very sick, weak Syncope Can not stand up, feels very sick, weak Syncope  . Clindamycin Other (See Comments)    GI problem  GI problem  GI problem   . Clindamycin/Lincomycin   . Hyoscyamine Diarrhea  . Lincomycin Other (See Comments)  . Maxitrol [Neomycin-Polymyxin-Dexameth]     Eye irritation  . Other Other (See Comments)    Eye irritation Eye irritation Eye irritation  . Timolol Other (See Comments)    Eye irritation Eye irritation Eye irritation Eye irritation  . Codeine Other (See Comments) and Nausea Only    Altered mental status FATIGUE, SICK FEELING Altered mental status Altered mental status FATIGUE, SICK FEELING  . Penicillins Rash    Has patient had a PCN reaction causing immediate rash, facial/tongue/throat swelling, SOB or lightheadedness with hypotension: Yes Has patient had a PCN reaction causing severe rash involving mucus membranes or skin necrosis: No Has patient had  a PCN reaction that required hospitalization: No Has patient had a PCN reaction occurring within the last 10 years: No If all of the above answers are "NO", then may proceed with Cephalosporin use.    Consultations:  Neurology   Procedures/Studies: CT Code Stroke CTA Head W/WO contrast  Result Date: 10/28/2019 CLINICAL DATA:  Left-sided weakness EXAM: CT ANGIOGRAPHY HEAD AND NECK TECHNIQUE: Multidetector CT imaging of the head and neck was performed using the standard protocol during bolus administration of intravenous contrast. Multiplanar CT image reconstructions and MIPs were obtained to evaluate the vascular anatomy. Carotid stenosis measurements (when applicable) are obtained utilizing NASCET criteria, using the distal internal carotid diameter as the denominator. CONTRAST:  89mL OMNIPAQUE IOHEXOL 350 MG/ML SOLN COMPARISON:  None. FINDINGS: CTA NECK Aortic arch: Calcified and noncalcified plaque along the visualized thoracic aorta. Great vessel origins are patent. Right carotid system: Patent. There is calcified plaque at the ICA origin causing less than 50% stenosis. Left carotid system: Patent. Minimal calcified plaque at the ICA origin without measurable stenosis. Vertebral arteries: Patent and codominant. Skeleton: Cervical spine dictated separately. Other neck: No mass or adenopathy. Upper chest: No apical lung mass. Review of the MIP images confirms the above findings CTA HEAD Anterior circulation: Intracranial internal carotid arteries are patent. Right middle cerebral artery is patent with moderate to severe stenosis of the mid M1 segment. Mild stenoses are present more distally. Left middle cerebral artery is patent. There is mild stenosis of the proximal left M1 segment. Additional mild stenosis are present more distally. Anterior cerebral arteries are patent. There is diffuse atherosclerotic irregularity with multifocal stenoses particularly of bilateral A2 and A3 segments, some of which  are marked. Posterior circulation: Intracranial vertebral arteries are patent. Basilar artery is patent. Posterior cerebral arteries are patent with atherosclerotic irregularity particularly of bilateral distal P2 and P3 segments with areas of moderate and severe stenosis. Venous sinuses: Patent as allowed by contrast bolus timing. Review of the MIP images confirms the above findings IMPRESSION: No large vessel occlusion or hemodynamically significant stenosis in the neck. Patent intracranial circulation with multifocal atherosclerosis including moderate to severe stenosis of the mid right M1 MCA and right A2/A3 ACA. Electronically Signed   By: Macy Mis M.D.   On: 10/28/2019 10:21   DG Chest 1 View  Result Date: 10/28/2019 CLINICAL DATA:  Left-sided weakness.  Fall. EXAM: CHEST  1 VIEW COMPARISON:  09/15/2019 FINDINGS: Heart size is mildly enlarged but unchanged. Coarse lung markings are suggestive for  chronic changes. No focal airspace disease. Negative for a pneumothorax. No gross abnormality to the osseous structures. IMPRESSION: No acute chest abnormality. Electronically Signed   By: Markus Daft M.D.   On: 10/28/2019 10:57   DG Ankle Complete Left  Result Date: 10/28/2019 CLINICAL DATA:  Fall 4 days ago. Left ankle pain and decreased mobility. EXAM: LEFT ANKLE COMPLETE - 3+ VIEW COMPARISON:  None. FINDINGS: There is no evidence of fracture, dislocation, or joint effusion. There is no evidence of arthropathy or other focal bone abnormality. Generalized osteopenia and mild peripheral vascular calcification noted. IMPRESSION: No acute findings. Electronically Signed   By: Marlaine Hind M.D.   On: 10/28/2019 11:00   CT Code Stroke CTA Neck W/WO contrast  Result Date: 10/28/2019 CLINICAL DATA:  Left-sided weakness EXAM: CT ANGIOGRAPHY HEAD AND NECK TECHNIQUE: Multidetector CT imaging of the head and neck was performed using the standard protocol during bolus administration of intravenous contrast.  Multiplanar CT image reconstructions and MIPs were obtained to evaluate the vascular anatomy. Carotid stenosis measurements (when applicable) are obtained utilizing NASCET criteria, using the distal internal carotid diameter as the denominator. CONTRAST:  60mL OMNIPAQUE IOHEXOL 350 MG/ML SOLN COMPARISON:  None. FINDINGS: CTA NECK Aortic arch: Calcified and noncalcified plaque along the visualized thoracic aorta. Great vessel origins are patent. Right carotid system: Patent. There is calcified plaque at the ICA origin causing less than 50% stenosis. Left carotid system: Patent. Minimal calcified plaque at the ICA origin without measurable stenosis. Vertebral arteries: Patent and codominant. Skeleton: Cervical spine dictated separately. Other neck: No mass or adenopathy. Upper chest: No apical lung mass. Review of the MIP images confirms the above findings CTA HEAD Anterior circulation: Intracranial internal carotid arteries are patent. Right middle cerebral artery is patent with moderate to severe stenosis of the mid M1 segment. Mild stenoses are present more distally. Left middle cerebral artery is patent. There is mild stenosis of the proximal left M1 segment. Additional mild stenosis are present more distally. Anterior cerebral arteries are patent. There is diffuse atherosclerotic irregularity with multifocal stenoses particularly of bilateral A2 and A3 segments, some of which are marked. Posterior circulation: Intracranial vertebral arteries are patent. Basilar artery is patent. Posterior cerebral arteries are patent with atherosclerotic irregularity particularly of bilateral distal P2 and P3 segments with areas of moderate and severe stenosis. Venous sinuses: Patent as allowed by contrast bolus timing. Review of the MIP images confirms the above findings IMPRESSION: No large vessel occlusion or hemodynamically significant stenosis in the neck. Patent intracranial circulation with multifocal atherosclerosis  including moderate to severe stenosis of the mid right M1 MCA and right A2/A3 ACA. Electronically Signed   By: Macy Mis M.D.   On: 10/28/2019 10:21   MR BRAIN WO CONTRAST  Result Date: 10/29/2019 CLINICAL DATA:  Stroke symptoms with worsening weakness EXAM: MRI HEAD WITHOUT CONTRAST TECHNIQUE: Multiplanar, multiecho pulse sequences of the brain and surrounding structures were obtained without intravenous contrast. COMPARISON:  Head CT and CTA earlier today FINDINGS: Brain: Scattered infarcts in the right cerebrum along the MCA, distal ACA, and watershed territories. Largest confluent infarct is at the posterior right temporal to low parietal cortex and white matter measuring up to 3 cm. ACA territory involvement is along the central sulcus. No hemorrhage, hydrocephalus, or masslike finding. Chronic small vessel ischemia in the white matter and pons. There is a line of remote lacunar infarcts in the periventricular right frontal lobe, likely past deep watershed injury. Vascular: Normal flow voids Skull and  upper cervical spine: Multilevel cervical ankylosis. No focal marrow lesion. Sinuses/Orbits: Bilateral cataract resection IMPRESSION: Multiple acute infarcts in the right MCA, distal ACA, and watershed territories. Electronically Signed   By: Monte Fantasia M.D.   On: 10/29/2019 04:24   CT C-SPINE NO CHARGE  Result Date: 10/28/2019 CLINICAL DATA:  Trauma, left-sided weakness EXAM: CT CERVICAL SPINE WITHOUT CONTRAST TECHNIQUE: CT of the cervical spine was reconstructed from same day CTA of the neck performed with intravenous contrast. No additional contrast was administered. Axial, coronal, and sagittal reformats were obtained COMPARISON:  None. FINDINGS: Alignment: No significant listhesis. Skull base and vertebrae: No acute cervical spine fracture. There is partial fusion across the C3-C4 and C4-C5 disc spaces. There is fusion of several facets. Soft tissues and spinal canal: No prevertebral fluid or  swelling. No visible canal hematoma. Disc levels: Multilevel degenerative changes are present including disc space narrowing, endplate osteophytes, and facet and uncovertebral hypertrophy. There is no high-grade osseous encroachment on the spinal canal. Upper chest: No apical lung mass. Other: None. IMPRESSION: No acute cervical spine fracture. Electronically Signed   By: Macy Mis M.D.   On: 10/28/2019 11:11   DG Foot Complete Left  Result Date: 10/28/2019 CLINICAL DATA:  Fall 4 days ago. Left foot pain and decreased mobility. Initial encounter. EXAM: LEFT FOOT - COMPLETE 3+ VIEW COMPARISON:  None. FINDINGS: There is no evidence of fracture or dislocation. Mild osteoarthritis is seen involving the interphalangeal joints of the phalanges. Moderate to severe osteoarthritis is seen involving the intertarsal joints and tarsal-metatarsal joints of the midfoot. Small plantar calcaneal bone spur noted. Generalized osteopenia is noted, but no focal lytic or sclerotic bone lesions are identified. Soft tissues are unremarkable. IMPRESSION: 1. No acute findings. 2. Osteoarthritis and osteopenia. Electronically Signed   By: Marlaine Hind M.D.   On: 10/28/2019 10:59   ECHOCARDIOGRAM COMPLETE  Result Date: 10/29/2019    ECHOCARDIOGRAM REPORT   Patient Name:   [facility] Date of Exam: 10/29/2019 Medical Rec #:  [id number]               Height:       66.0 in Accession #:    [id number]              Weight:       119.7 lb Date of Birth:  May 22, 1919               BSA:          1.608 m Patient Age:    42 years                BP:           154/98 mmHg Patient Gender: F                       HR:           62 bpm. Exam Location:  Inpatient Procedure: 2D Echo, Cardiac Doppler and Color Doppler Indications:    Stroke 434.91 / I163.9  History:        Patient has prior history of Echocardiogram examinations, most                 recent 09/17/2019. Previous Myocardial Infarction, COPD,                 Arrythmias:Atrial  Fibrillation; Risk Factors:Hypertension and  Dyslipidemia. Cancer.  Sonographer:    Jonelle Sidle Dance Referring Phys: [id number] Lawrence M ECKSTAT IMPRESSIONS  1. Left ventricular ejection fraction, by estimation, is 60 to 65%. The left ventricle has normal function. The left ventricle has no regional wall motion abnormalities. There is mild concentric left ventricular hypertrophy. Left ventricular diastolic parameters are consistent with Grade I diastolic dysfunction (impaired relaxation).  2. Right ventricular systolic function is normal. The right ventricular size is normal. There is normal pulmonary artery systolic pressure.  3. Left atrial size was mildly dilated.  4. Right atrial size was mildly dilated.  5. The mitral valve is degenerative. Mild mitral valve regurgitation.  6. Tricuspid valve regurgitation is moderate to severe.  7. The aortic valve is tricuspid. Aortic valve regurgitation is not visualized. Mild aortic valve sclerosis is present, with no evidence of aortic valve stenosis.  8. The inferior vena cava is normal in size with greater than 50% respiratory variability, suggesting right atrial pressure of 3 mmHg.  9. Evidence of atrial level shunting detected by color flow Doppler. There is a small patent foramen ovale with predominantly left to right shunting across the atrial septum. Conclusion(s)/Recommendation(s): No intracardiac source of embolism detected on this transthoracic study. A transesophageal echocardiogram is recommended to exclude cardiac source of embolism if clinically indicated. FINDINGS  Left Ventricle: Left ventricular ejection fraction, by estimation, is 60 to 65%. The left ventricle has normal function. The left ventricle has no regional wall motion abnormalities. The left ventricular internal cavity size was normal in size. There is  mild concentric left ventricular hypertrophy. Left ventricular diastolic parameters are consistent with Grade I diastolic dysfunction  (impaired relaxation). Right Ventricle: The right ventricular size is normal. No increase in right ventricular wall thickness. Right ventricular systolic function is normal. There is normal pulmonary artery systolic pressure. The tricuspid regurgitant velocity is 2.68 m/s, and  with an assumed right atrial pressure of 3 mmHg, the estimated right ventricular systolic pressure is 73.2 mmHg. Left Atrium: Left atrial size was mildly dilated. Right Atrium: Right atrial size was mildly dilated. Pericardium: There is no evidence of pericardial effusion. Mitral Valve: The mitral valve is degenerative in appearance. There is mild calcification of the mitral valve leaflet(s). Severe mitral annular calcification. Mild mitral valve regurgitation. Tricuspid Valve: The tricuspid valve is normal in structure. Tricuspid valve regurgitation is moderate to severe. Aortic Valve: The aortic valve is tricuspid. Aortic valve regurgitation is not visualized. Mild aortic valve sclerosis is present, with no evidence of aortic valve stenosis. Pulmonic Valve: The pulmonic valve was not well visualized. Pulmonic valve regurgitation is not visualized. Aorta: The aortic root was not well visualized and the ascending aorta was not well visualized. Venous: The inferior vena cava is normal in size with greater than 50% respiratory variability, suggesting right atrial pressure of 3 mmHg. IAS/Shunts: Evidence of atrial level shunting detected by color flow Doppler. A small patent foramen ovale is detected with predominantly left to right shunting across the atrial septum.  LEFT VENTRICLE PLAX 2D LVIDd:         3.60 cm  Diastology LVIDs:         2.50 cm  LV e' lateral:   3.37 cm/s LV PW:         1.30 cm  LV E/e' lateral: 32.9 LV IVS:        1.40 cm  LV e' medial:    3.05 cm/s LVOT diam:     2.00 cm  LV E/e' medial:  36.4  LV SV:         100 LV SV Index:   62 LVOT Area:     3.14 cm  RIGHT VENTRICLE            IVC RV Basal diam:  2.80 cm    IVC diam:  1.70 cm RV S prime:     9.03 cm/s TAPSE (M-mode): 2.0 cm LEFT ATRIUM           Index LA diam:      2.40 cm 1.49 cm/m LA Vol (A2C): 45.7 ml 28.42 ml/m LA Vol (A4C): 35.4 ml 22.02 ml/m  AORTIC VALVE LVOT Vmax:   125.50 cm/s LVOT Vmean:  89.900 cm/s LVOT VTI:    0.317 m  AORTA Ao Root diam: 4.00 cm MITRAL VALVE                TRICUSPID VALVE MV Area (PHT): 1.81 cm     TR Peak grad:   28.7 mmHg MV Decel Time: 419 msec     TR Vmax:        268.00 cm/s MV E velocity: 111.00 cm/s MV A velocity: 144.00 cm/s  SHUNTS MV E/A ratio:  0.77         Systemic VTI:  0.32 m                             Systemic Diam: 2.00 cm Christy Dresser MD Electronically signed by Christy Dresser MD Signature Date/Time: 10/29/2019/9:37:53 PM    Final    DG Hip Unilat W or Wo Pelvis 2-3 Views Left  Result Date: 10/28/2019 CLINICAL DATA:  Left-sided weakness status post fall EXAM: DG HIP (WITH OR WITHOUT PELVIS) 2-3V LEFT COMPARISON:  None. FINDINGS: There is no evidence of hip fracture or dislocation. There is no evidence of arthropathy or other focal bone abnormality. There is generalized osteopenia. IMPRESSION: No acute osseous injury of the left. Given the patient's age and osteopenia, if there is persistent clinical concern for an occult hip fracture, a MRI of the hip is recommended for increased sensitivity. Electronically Signed   By: Kathreen Devoid   On: 10/28/2019 10:59   CT HEAD CODE STROKE WO CONTRAST  Result Date: 10/28/2019 CLINICAL DATA:  Code stroke.  Left-sided weakness EXAM: CT HEAD WITHOUT CONTRAST TECHNIQUE: Contiguous axial images were obtained from the base of the skull through the vertex without intravenous contrast. COMPARISON:  None. FINDINGS: Brain: There is no acute intracranial hemorrhage or mass effect. There is new hypoattenuation with loss of gray-white differentiation in the posterior right temporal lobe. Additional smaller area of loss of gray-white differentiation involving the parasagittal  posterior right frontal lobe. There is a new 8 mm focus of hypoattenuation in the right centrum semiovale. Prominence of the ventricles and sulci reflects stable parenchymal volume loss. Additional patchy and confluent hypoattenuation in the supratentorial white matter is nonspecific but probably reflects stable chronic microvascular ischemic changes. Vascular: No hyperdense vessel. Skull: Unremarkable Sinuses/Orbits: Mild mucosal thickening. Bilateral lens replacements. Other: Mastoid air cells are clear. ASPECTS [facility] Stroke Program Early CT Score) - Ganglionic level infarction (caudate, lentiform nuclei, internal capsule, insula, M1-M3 cortex): 6 - Supraganglionic infarction (M4-M6 cortex): 3 Total score (0-10 with 10 being normal): 10 IMPRESSION: No acute intracranial hemorrhage. Acute infarction involving the posterior right temporal lobe. Additional smaller acute infarction involving the parasagittal posterior right frontal lobe. New age-indeterminate subcentimeter infarct of the right centrum semiovale. These results were communicated  to Dr. Cheral Marker at 9:30 amon 5/31/2021by text page via the [facility] messaging system. Electronically Signed   By: Macy Mis M.D.   On: 10/28/2019 09:47     Subjective: Patient seen and examined.  No overnight events.  She was trying to demonstrate that she can use her left hand.  Remains pleasant.  Agreeable to go to rehab. Has slightly elevated blood pressures in the morning, improved with morning medications.   Discharge Exam: Vitals:   11/01/19 0834 11/01/19 0905  BP: (!) 193/92 (!) 153/105  Pulse: 76   Resp: (!) 22   Temp: 97.7 F (36.5 C)   SpO2: 98%    Vitals:   11/01/19 0350 11/01/19 0400 11/01/19 0834 11/01/19 0905  BP: (!) 126/94  (!) 193/92 (!) 153/105  Pulse: 69 60 76   Resp: 19 16 (!) 22   Temp: 98.2 F (36.8 C)  97.7 F (36.5 C)   TempSrc: Oral  Oral   SpO2: 100% 99% 98%   Weight:      Height:        General: Pt is alert, awake,  not in acute distress, she is very appropriate for age.  She is alert oriented x4. Cardiovascular: RRR, S1/S2 +, no rubs, no gallops Respiratory: CTA bilaterally, no wheezing, no rhonchi Abdominal: Soft, NT, ND, bowel sounds + Extremities: no edema, no cyanosis Neuro exam: Cranial nerves no deficits Left upper extremity, 3/5 and improving Left lower extremity, 2/5 and improving Skin tears on the left lower extremity, dressed with dry dressing.    The results of significant diagnostics from this hospitalization (including imaging, microbiology, ancillary and laboratory) are listed below for reference.     Microbiology: Recent Results (from the past 240 hour(s))  SARS Coronavirus 2 by RT PCR (hospital order, performed in [facility] hospital lab) Nasopharyngeal Nasopharyngeal Swab     Status: None   Collection Time: 10/28/19 11:58 AM   Specimen: Nasopharyngeal Swab  Result Value Ref Range Status   SARS Coronavirus 2 NEGATIVE NEGATIVE Final    Comment: (NOTE) SARS-CoV-2 target nucleic acids are NOT DETECTED. The SARS-CoV-2 RNA is generally detectable in upper and lower respiratory specimens during the acute phase of infection. The lowest concentration of SARS-CoV-2 viral copies this assay can detect is 250 copies / mL. A negative result does not preclude SARS-CoV-2 infection and should not be used as the sole basis for treatment or other patient management decisions.  A negative result may occur with improper specimen collection / handling, submission of specimen other than nasopharyngeal swab, presence of viral mutation(s) within the areas targeted by this assay, and inadequate number of viral copies (<250 copies / mL). A negative result must be combined with clinical observations, patient history, and epidemiological information. Fact Sheet for Patients:   StrictlyIdeas.no Fact Sheet for Healthcare  Providers: BankingDealers.co.za This test is not yet approved or cleared  by the [place] FDA and has been authorized for detection and/or diagnosis of SARS-CoV-2 by FDA under an Emergency Use Authorization (EUA).  This EUA will remain in effect (meaning this test can be used) for the duration of the COVID-19 declaration under Section 564(b)(1) of the Act, 21 U.S.C. section 360bbb-3(b)(1), unless the authorization is terminated or revoked sooner. Performed at [facility], [place] [address], [place], [place] [address]   Culture, Urine     Status: Abnormal   Collection Time: 10/29/19  4:42 AM   Specimen: Urine, Clean Catch  Result Value Ref Range Status  Specimen Description URINE, CLEAN CATCH  Final   Special Requests   Final    NONE Performed at [facility], [place] [address] [place], [place] [address]    Culture MULTIPLE SPECIES PRESENT, SUGGEST RECOLLECTION (A)  Final   Report Status 10/30/2019 FINAL  Final     Labs: BNP (last 3 results) No results for input(s): BNP in the last 8760 hours. Basic Metabolic Panel: Recent Labs  Lab 10/28/19 0918 10/28/19 0924 10/29/19 0455  NA 142 143 142  K 3.9 4.0 3.1*  CL 106 121* 106  CO2 26  --  28  GLUCOSE 136* 137* 106*  BUN 9 10 11   CREATININE 0.75 0.80 0.72  CALCIUM 9.3  --  8.7*   Liver Function Tests: Recent Labs  Lab 10/28/19 0918 10/29/19 0455  AST 26 22  ALT 21 16  ALKPHOS 148* 120  BILITOT 0.9 0.9  PROT 7.4 6.5  ALBUMIN 3.8 3.3*   No results for input(s): LIPASE, AMYLASE in the last 168 hours. No results for input(s): AMMONIA in the last 168 hours. CBC: Recent Labs  Lab 10/28/19 0918 10/28/19 0924 10/29/19 0455  WBC 6.8  --  4.7  NEUTROABS 5.8  --   --   HGB 12.9 12.2 11.1*  HCT 38.5 36.0 34.1*  MCV 96.5  --  98.3  PLT 233  --  201   Cardiac Enzymes: No results for input(s): CKTOTAL, CKMB, CKMBINDEX, TROPONINI in the last 168 hours. BNP: Invalid input(s):  POCBNP CBG: Recent Labs  Lab 10/28/19 0915  GLUCAP 126*   D-Dimer No results for input(s): DDIMER in the last 72 hours. Hgb A1c No results for input(s): HGBA1C in the last 72 hours. Lipid Profile No results for input(s): CHOL, HDL, LDLCALC, TRIG, CHOLHDL, LDLDIRECT in the last 72 hours. Thyroid function studies No results for input(s): TSH, T4TOTAL, T3FREE, THYROIDAB in the last 72 hours.  Invalid input(s): FREET3 Anemia work up No results for input(s): VITAMINB12, FOLATE, FERRITIN, TIBC, IRON, RETICCTPCT in the last 72 hours. Urinalysis    Component Value Date/Time   COLORURINE YELLOW (A) 09/15/2019 1610   APPEARANCEUR CLOUDY (A) 09/15/2019 1610   APPEARANCEUR Hazy 07/13/2013 0212   LABSPEC 1.023 09/15/2019 1610   LABSPEC 1.015 07/13/2013 0212   PHURINE 5.0 09/15/2019 1610   GLUCOSEU NEGATIVE 09/15/2019 1610   GLUCOSEU 50 mg/dL 07/13/2013 0212   HGBUR NEGATIVE 09/15/2019 1610   BILIRUBINUR NEGATIVE 09/15/2019 1610   BILIRUBINUR Negative 07/13/2013 0212   KETONESUR NEGATIVE 09/15/2019 1610   PROTEINUR 30 (A) 09/15/2019 1610   NITRITE NEGATIVE 09/15/2019 1610   LEUKOCYTESUR LARGE (A) 09/15/2019 1610   LEUKOCYTESUR Negative 07/13/2013 0212   Sepsis Labs Invalid input(s): PROCALCITONIN,  WBC,  LACTICIDVEN Microbiology Recent Results (from the past 240 hour(s))  SARS Coronavirus 2 by RT PCR (hospital order, performed in [place] hospital lab) Nasopharyngeal Nasopharyngeal Swab     Status: None   Collection Time: 10/28/19 11:58 AM   Specimen: Nasopharyngeal Swab  Result Value Ref Range Status   SARS Coronavirus 2 NEGATIVE NEGATIVE Final    Comment: (NOTE) SARS-CoV-2 target nucleic acids are NOT DETECTED. The SARS-CoV-2 RNA is generally detectable in upper and lower respiratory specimens during the acute phase of infection. The lowest concentration of SARS-CoV-2 viral copies this assay can detect is 250 copies / mL. A negative result does not preclude SARS-CoV-2  infection and should not be used as the sole basis for treatment or other patient management decisions.  A  negative result may occur with improper specimen collection / handling, submission of specimen other than nasopharyngeal swab, presence of viral mutation(s) within the areas targeted by this assay, and inadequate number of viral copies (<250 copies / mL). A negative result must be combined with clinical observations, patient history, and epidemiological information. Fact Sheet for Patients:   StrictlyIdeas.no Fact Sheet for Healthcare Providers: BankingDealers.co.za This test is not yet approved or cleared  by the [place] FDA and has been authorized for detection and/or diagnosis of SARS-CoV-2 by FDA under an Emergency Use Authorization (EUA).  This EUA will remain in effect (meaning this test can be used) for the duration of the COVID-19 declaration under Section 564(b)(1) of the Act, 21 U.S.C. section 360bbb-3(b)(1), unless the authorization is terminated or revoked sooner. Performed at [facility], [place] [address], [place], [place] [address]   Culture, Urine     Status: Abnormal   Collection Time: 10/29/19  4:42 AM   Specimen: Urine, Clean Catch  Result Value Ref Range Status   Specimen Description URINE, CLEAN CATCH  Final   Special Requests   Final    NONE Performed at [facility], [place] [address] [place], [place] [address]    Culture MULTIPLE SPECIES PRESENT, SUGGEST RECOLLECTION (A)  Final   Report Status 10/30/2019 FINAL  Final     Time coordinating discharge:  35 minutes  SIGNED:   Barb Merino, MD  [facility] 11/01/2019, 10:52 AM

## 2019-11-01 NOTE — Progress Notes (Signed)
Physical Therapy Treatment Patient Details Name: Christy Waters MRN: [id number] DOB: May 04, 1919 Today's Date: 11/01/2019    History of Present Illness Pt is a 86 y.o. female with hx of HTN, HLD, Afib not on anticoagulation due to hx of falls, OA, depression, who presented from home via EMS with L sided deficits and was found to have an acute posterior right temporal lobe and parasagittal posterior R frontal lobe.  Her symptoms began on Thursday when she fell.  L LE imaging negative for acute fx.    PT Comments    Patient seen for PT treatment. Continue to recommend SNF for further skilled PT services to decreased risk of falls and caregiver burden.    Follow Up Recommendations  SNF;Supervision/Assistance - 24 hour     Equipment Recommendations  Other (comment)(TBD next venue)    Recommendations for Other Services Rehab consult     Precautions / Restrictions Precautions Precautions: Fall Precaution Comments: L inattention, L hemi Restrictions Weight Bearing Restrictions: No    Mobility  Bed Mobility Overal bed mobility: Needs Assistance Bed Mobility: Supine to Sit     Supine to sit: Mod assist     General bed mobility comments: multimodal cues for sequencing; assistance required to bring L LE and hips to EOB and to elevate trunk into sitting  Transfers Overall transfer level: Needs assistance   Transfers: Sit to/from Stand Sit to Stand: Mod assist;+2 safety/equipment;+2 physical assistance         General transfer comment: difficulty with bilat foot placement on Stedy with tendency to extend L LE and adduct R LE; pt began standing before given cues but able to power up with less assist today; facilitation of hip extension required particularly on L side; pt did not tolerate use Stedy well this session due to positioning of L LE and painful shin   Ambulation/Gait                 Stairs             Wheelchair Mobility    Modified Rankin  (Stroke Patients Only) Modified Rankin (Stroke Patients Only) Pre-Morbid Rankin Score: Slight disability Modified Rankin: Severe disability     Balance Overall balance assessment: Needs assistance Sitting-balance support: Single extremity supported;Feet supported Sitting balance-Leahy Scale: Fair     Standing balance support: Bilateral upper extremity supported;During functional activity Standing balance-Leahy Scale: Zero                              Cognition Arousal/Alertness: Awake/alert Behavior During Therapy: WFL for tasks assessed/performed Overall Cognitive Status: Impaired/Different from baseline Area of Impairment: Problem solving;Safety/judgement;Following commands                       Following Commands: Follows one step commands with increased time;Follows one step commands consistently Safety/Judgement: Decreased awareness of safety;Decreased awareness of deficits   Problem Solving: Difficulty sequencing;Requires verbal cues;Requires tactile cues        Exercises Other Exercises Other Exercises: PROM L ankle     General Comments        Pertinent Vitals/Pain Pain Assessment: Faces Faces Pain Scale: Hurts even more Pain Location: L LE (shin/wound) Pain Descriptors / Indicators: Grimacing;Guarding Pain Intervention(s): Limited activity within patient's tolerance;Monitored during session;Repositioned    Home Living                      Prior Function  PT Goals (current goals can now be found in the care plan section) Progress towards PT goals: Progressing toward goals    Frequency    Min 3X/week      PT Plan Current plan remains appropriate    Co-evaluation              AM-PAC PT "6 Clicks" Mobility   Outcome Measure  Help needed turning from your back to your side while in a flat bed without using bedrails?: A Lot Help needed moving from lying on your back to sitting on the side of a flat  bed without using bedrails?: A Lot Help needed moving to and from a bed to a chair (including a wheelchair)?: A Lot Help needed standing up from a chair using your arms (e.g., wheelchair or bedside chair)?: A Lot Help needed to walk in hospital room?: Total Help needed climbing 3-5 steps with a railing? : Total 6 Click Score: 10    End of Session Equipment Utilized During Treatment: Gait belt Activity Tolerance: Patient tolerated treatment well Patient left: in chair;with call bell/phone within reach;with chair alarm set;with family/visitor present Nurse Communication: Mobility status PT Visit Diagnosis: Other abnormalities of gait and mobility (R26.89);Muscle weakness (generalized) (M62.81);Other symptoms and signs involving the nervous system (R29.898)     Time: 1200-1230 PT Time Calculation (min) (ACUTE ONLY): 30 min  Charges:  $Therapeutic Activity: 23-37 mins                     Earney Navy, PTA Acute Rehabilitation Services Pager: [phone] Office: [phone]     Darliss Cheney 11/01/2019, 1:35 PM

## 2019-11-01 NOTE — Progress Notes (Signed)
   11/01/19 0834  Vitals  Temp 97.7 F (36.5 C)  Temp Source Oral  BP (!) 193/92  MAP (mmHg) 119  BP Location Left Arm  BP Method Automatic  Patient Position (if appropriate) Lying  Pulse Rate 76  Pulse Rate Source Dinamap  ECG Heart Rate 78  Resp (!) 22  Oxygen Therapy  SpO2 98 %  O2 Device Room Air  MEWS Score  MEWS Temp 0  MEWS Systolic 0  MEWS Pulse 0  MEWS RR 1  MEWS LOC 0  MEWS Score 1  MEWS Score Color Green  Above are patient vital signs, elevated BP noted. MD notified. Administered 5 mg lopressor at 0841. Will continue to monitor.

## 2019-11-01 NOTE — Plan of Care (Signed)
  Problem: Health Behavior/Discharge Planning: Goal: Ability to manage health-related needs will improve Outcome: Adequate for Discharge   Problem: Activity: Goal: Risk for activity intolerance will decrease Outcome: Adequate for Discharge   Problem: Pain Managment: Goal: General experience of comfort will improve Outcome: Adequate for Discharge   Problem: Safety: Goal: Ability to remain free from injury will improve Outcome: Adequate for Discharge   Problem: Skin Integrity: Goal: Risk for impaired skin integrity will decrease Outcome: Adequate for Discharge   Problem: Safety: Goal: Ability to remain free from injury will improve Outcome: Adequate for Discharge   Problem: Skin Integrity: Goal: Risk for impaired skin integrity will decrease Outcome: Adequate for Discharge   Problem: Education: Goal: Knowledge of disease or condition will improve Outcome: Adequate for Discharge Goal: Knowledge of secondary prevention will improve Outcome: Adequate for Discharge Goal: Knowledge of patient specific risk factors addressed and post discharge goals established will improve Outcome: Adequate for Discharge Goal: Individualized Educational Video(s) Outcome: Adequate for Discharge   Problem: Coping: Goal: Will verbalize positive feelings about self Outcome: Adequate for Discharge Goal: Will identify appropriate support needs Outcome: Adequate for Discharge   Problem: Health Behavior/Discharge Planning: Goal: Ability to manage health-related needs will improve Outcome: Adequate for Discharge   Problem: Self-Care: Goal: Ability to participate in self-care as condition permits will improve Outcome: Adequate for Discharge Goal: Verbalization of feelings and concerns over difficulty with self-care will improve Outcome: Adequate for Discharge Goal: Ability to communicate needs accurately will improve Outcome: Adequate for Discharge   Problem: Nutrition: Goal: Risk of  aspiration will decrease Outcome: Adequate for Discharge   Problem: Intracerebral Hemorrhage Tissue Perfusion: Goal: Complications of Intracerebral Hemorrhage will be minimized Outcome: Adequate for Discharge   Problem: Ischemic Stroke/TIA Tissue Perfusion: Goal: Complications of ischemic stroke/TIA will be minimized Outcome: Adequate for Discharge   Problem: Spontaneous Subarachnoid Hemorrhage Tissue Perfusion: Goal: Complications of Spontaneous Subarachnoid Hemorrhage will be minimized Outcome: Adequate for Discharge

## 2019-11-04 ENCOUNTER — Ambulatory Visit: Payer: Medicare Other | Admitting: Podiatry

## 2019-11-04 DIAGNOSIS — I10 Essential (primary) hypertension: Secondary | ICD-10-CM

## 2019-11-04 DIAGNOSIS — F39 Unspecified mood [affective] disorder: Secondary | ICD-10-CM | POA: Diagnosis not present

## 2019-11-04 DIAGNOSIS — G8194 Hemiplegia, unspecified affecting left nondominant side: Secondary | ICD-10-CM | POA: Diagnosis not present

## 2019-11-04 DIAGNOSIS — I480 Paroxysmal atrial fibrillation: Secondary | ICD-10-CM

## 2019-11-04 DIAGNOSIS — R109 Unspecified abdominal pain: Secondary | ICD-10-CM

## 2019-11-18 DIAGNOSIS — R2232 Localized swelling, mass and lump, left upper limb: Secondary | ICD-10-CM | POA: Diagnosis not present

## 2019-11-18 DIAGNOSIS — S5010XA Contusion of unspecified forearm, initial encounter: Secondary | ICD-10-CM

## 2019-12-06 DIAGNOSIS — B369 Superficial mycosis, unspecified: Secondary | ICD-10-CM

## 2019-12-07 ENCOUNTER — Emergency Department: Payer: Medicare Other

## 2019-12-07 ENCOUNTER — Emergency Department
Admission: EM | Admit: 2019-12-07 | Discharge: 2019-12-07 | Disposition: A | Discharge status: Skilled Nursing Facility | Payer: Medicare Other | Attending: Emergency Medicine | Admitting: Emergency Medicine

## 2019-12-07 ENCOUNTER — Encounter: Payer: Self-pay | Admitting: Emergency Medicine

## 2019-12-07 ENCOUNTER — Other Ambulatory Visit: Payer: Self-pay

## 2019-12-07 DIAGNOSIS — Z7902 Long term (current) use of antithrombotics/antiplatelets: Secondary | ICD-10-CM | POA: Insufficient documentation

## 2019-12-07 DIAGNOSIS — I160 Hypertensive urgency: Secondary | ICD-10-CM | POA: Diagnosis not present

## 2019-12-07 DIAGNOSIS — S51812A Laceration without foreign body of left forearm, initial encounter: Secondary | ICD-10-CM | POA: Insufficient documentation

## 2019-12-07 DIAGNOSIS — Y939 Activity, unspecified: Secondary | ICD-10-CM | POA: Diagnosis not present

## 2019-12-07 DIAGNOSIS — Y999 Unspecified external cause status: Secondary | ICD-10-CM | POA: Insufficient documentation

## 2019-12-07 DIAGNOSIS — Z85828 Personal history of other malignant neoplasm of skin: Secondary | ICD-10-CM | POA: Diagnosis not present

## 2019-12-07 DIAGNOSIS — Z79899 Other long term (current) drug therapy: Secondary | ICD-10-CM | POA: Diagnosis not present

## 2019-12-07 DIAGNOSIS — S0083XA Contusion of other part of head, initial encounter: Secondary | ICD-10-CM | POA: Diagnosis not present

## 2019-12-07 DIAGNOSIS — Y92129 Unspecified place in nursing home as the place of occurrence of the external cause: Secondary | ICD-10-CM | POA: Insufficient documentation

## 2019-12-07 DIAGNOSIS — I10 Essential (primary) hypertension: Secondary | ICD-10-CM | POA: Insufficient documentation

## 2019-12-07 DIAGNOSIS — S0990XA Unspecified injury of head, initial encounter: Secondary | ICD-10-CM | POA: Diagnosis not present

## 2019-12-07 DIAGNOSIS — W19XXXA Unspecified fall, initial encounter: Secondary | ICD-10-CM | POA: Insufficient documentation

## 2019-12-07 DIAGNOSIS — Z7982 Long term (current) use of aspirin: Secondary | ICD-10-CM | POA: Diagnosis not present

## 2019-12-07 LAB — COMPREHENSIVE METABOLIC PANEL
ALT: 22 U/L (ref 0–44)
AST: 26 U/L (ref 15–41)
Albumin: 3.6 g/dL (ref 3.5–5.0)
Alkaline Phosphatase: 95 U/L (ref 38–126)
Anion gap: 10 (ref 5–15)
BUN: 18 mg/dL (ref 8–23)
CO2: 24 mmol/L (ref 22–32)
Calcium: 8.9 mg/dL (ref 8.9–10.3)
Chloride: 103 mmol/L (ref 98–111)
Creatinine, Ser: 0.66 mg/dL (ref 0.44–1.00)
GFR calc Af Amer: >60 mL/min (ref >60)
GFR calc non Af Amer: >60 mL/min (ref >60)
Glucose, Bld: 117 mg/dL — ABNORMAL HIGH (ref 70–99)
Potassium: 3.7 mmol/L (ref 3.5–5.1)
Sodium: 137 mmol/L (ref 135–145)
Total Bilirubin: 0.8 mg/dL (ref 0.3–1.2)
Total Protein: 6.9 g/dL (ref 6.5–8.1)

## 2019-12-07 LAB — CK: Total CK: 49 U/L (ref 38–234)

## 2019-12-07 LAB — CBC
HCT: 32.7 % — ABNORMAL LOW (ref 36.0–46.0)
Hemoglobin: 11 g/dL — ABNORMAL LOW (ref 12.0–15.0)
MCH: 32.4 pg (ref 26.0–34.0)
MCHC: 33.6 g/dL (ref 30.0–36.0)
MCV: 96.2 fL (ref 80.0–100.0)
Platelets: 248 10*3/uL (ref 150–400)
RBC: 3.4 MIL/uL — ABNORMAL LOW (ref 3.87–5.11)
RDW: 14.4 % (ref 11.5–15.5)
WBC: 7.5 10*3/uL (ref 4.0–10.5)
nRBC: 0 % (ref 0.0–0.2)

## 2019-12-07 LAB — PROTIME-INR
INR: 1 (ref 0.8–1.2)
Prothrombin Time: 12.8 seconds (ref 11.4–15.2)

## 2019-12-07 LAB — TROPONIN I (HIGH SENSITIVITY)
Troponin I (High Sensitivity): 14 ng/L (ref <18)
Troponin I (High Sensitivity): 15 ng/L (ref <18)

## 2019-12-07 MED ORDER — LIDOCAINE-EPINEPHRINE 2 %-1:100000 IJ SOLN
30.0000 mL | Freq: Once | INTRAMUSCULAR | Status: AC
  Administered 2019-12-07: 30 mL via INTRADERMAL
  Filled 2019-12-07: qty 2

## 2019-12-07 MED ORDER — LIDOCAINE-EPINEPHRINE-TETRACAINE (LET) TOPICAL GEL
3.0000 mL | Freq: Once | TOPICAL | Status: AC
  Administered 2019-12-07: 3 mL via TOPICAL

## 2019-12-07 MED ORDER — ONDANSETRON HCL 4 MG/2ML IJ SOLN
4.0000 mg | Freq: Once | INTRAMUSCULAR | Status: AC
  Administered 2019-12-07: 4 mg via INTRAVENOUS

## 2019-12-07 NOTE — ED Notes (Signed)
daughter updated on pt dc status

## 2019-12-07 NOTE — ED Provider Notes (Signed)
[facility] Department Provider Note  ____________________________________________  Time seen: Approximately 7:04 PM  I have reviewed the triage vital signs and the nursing notes.   HISTORY  Chief Complaint Fall    HPI Christy Waters is a 86 y.o. female that presents to the emergency department for evaluation after unwitnessed fall at [facility] today. Patient told EMS that she was trying to go from her chair to her bed without assistance. Patient remembers this but does not remember the fall. She is on Plavix. She has some bruising around her left eye. She has a large hematoma to her left forearm. Patient states that she currently has pain to her left forearm but denies any additional pain.   Past Medical History:  Diagnosis Date  . Anemia    distant past  . Arthritis    fingers, hips  . Cancer ([place])    skin cancer; some spots on left side of face; been 5 years since last treated;   . Headache   . History of echocardiogram    a. TTE 04/06/17: EF > 55%, no RWMA, no LA mass, severe TR, RV systolic dysfunction, elevated PASP; b. TTE 04/2017: EF 65-70%, no RWMA, Gr1DD, calcified mitral annulus w/ mild MR, mild to mod dilated LA w/ irregular, echodense material in the LA incompletely visualized, possibly representing artifact vs thrombus, or intracardiac mass, rec TEE, mod TR, mildly dilated RV, inc PASP  . History of nuclear stress test    a. 12/18: no sig ischemia, low risk  . HLD (hyperlipidemia)   . Left atrial mass    a. see echo from 04/2017 - ? mass vs LA thrombus in the setting of PAF -->Eliquis.  . Osteoporosis   . PAF (paroxysmal atrial fibrillation) ([place])    a. diagnosed 05/10/2017; b. CHADS2VASc --> 3 (age x 2, female)--> Eliquis 2.5 (age/wt).    Patient Active Problem List   Diagnosis Date Noted  . Stroke ([place]) 10/28/2019  . Hypertensive urgency 09/16/2019  . Syncope, vasovagal 09/15/2019  . Syncope and collapse 09/15/2019   . Acute lower UTI 09/15/2019  . Pelvic pain in female 03/20/2018  . Cor pulmonale ([place]) 09/19/2017  . Low serum vitamin D 06/19/2017  . History of atrial fibrillation 06/19/2017  . Chest pain 05/10/2017  . Moderate episode of recurrent major depressive disorder ([place]) 04/04/2017  . Adult idiopathic generalized osteoporosis 04/13/2016  . 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 Irritable bowel syndrome with diarrhea 05/20/2014  . Abdominal pain, generalized 04/01/2014  . Atherosclerosis of aorta ([place]) 03/05/2014  . 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 Idiopathic peripheral neuropathy 03/05/2014  . Abdominal pain of unknown cause 02/06/2014  . MI (mitral incompetence) 02/06/2014  . Stress incontinence 02/06/2014  . Fibrosis of lung ([place]) 10/03/2013  . Generalized OA 10/03/2013  . HLD (hyperlipidemia) 10/03/2013  . BP (high blood pressure) 10/03/2013  . OP (osteoporosis) 10/03/2013  . Beat, premature ventricular 10/03/2013  . Ventricular premature depolarization 10/03/2013    Past Surgical History:  Procedure Laterality Date  . CATARACT EXTRACTION W/PHACO Left 09/14/2015   Procedure: CATARACT EXTRACTION PHACO AND INTRAOCULAR LENS PLACEMENT (IOC);  Surgeon: Ronnell Freshwater, MD;  Location: [place];  Service: Ophthalmology;  Laterality: Left;  [place] Right    cataract surgery schedule for Easter Monday 2017;     Prior to Admission medications   Medication Sig Start Date End Date Taking? Authorizing Provider  acetaminophen (TYLENOL) 325 MG tablet Take 650 mg  by mouth every 4 (four) hours as needed for mild pain or moderate pain.   Yes [provider]  amitriptyline (ELAVIL) 10 MG tablet Take 5 mg by mouth at bedtime.    Yes [provider]  aspirin EC 81 MG tablet Take 81 mg by mouth daily. Swallow whole.   Yes [provider]  atorvastatin (LIPITOR) 80 MG tablet Take 1 tablet (80 mg total) by mouth daily. 11/01/19 01/30/20 Yes  Ghimire, Dante Gang, MD  Cholecalciferol (VITAMIN D3) 50 MCG (2000 UT) TABS Take 2,000 Units by mouth at bedtime.    Yes [provider]  clopidogrel (PLAVIX) 75 MG tablet Take 1 tablet (75 mg total) by mouth daily. 11/01/19 01/30/20 Yes Barb Merino, MD  dicyclomine (BENTYL) 10 MG capsule Take 10 mg by mouth every 6 (six) hours as needed (abdominal pain/cramping).  09/16/19  Yes [provider]  LUMIGAN 0.01 % SOLN Place 1 drop into both eyes at bedtime.    Yes [provider]  miconazole (MICOTIN) 2 % cream Apply 1 application topically 2 (two) times daily. (affected area on back)   Yes [provider]  olmesartan (BENICAR) 5 MG tablet Take 5 mg by mouth at bedtime.    Yes [provider]  PARoxetine (PAXIL) 20 MG tablet Take 20 mg by mouth daily.    Yes [provider]  potassium chloride (KLOR-CON) 10 MEQ tablet Take 10 mEq by mouth daily.   Yes [provider]  Probiotic Product (ALIGN) 4 MG CAPS Take 4 mg by mouth daily.    Yes [provider]    Allergies Azithromycin, Clindamycin, Clindamycin/lincomycin, Hyoscyamine, Lincomycin, Maxitrol [neomycin-polymyxin-dexameth], Other, Timolol, Codeine, and Penicillins  Family History  Problem Relation Age of Onset  . CAD Mother   . Arrhythmia Mother   . Liver cancer Father     Social History Social History   Tobacco Use  . Smoking status: Never Smoker  . Smokeless tobacco: Never Used  Vaping Use  . Vaping Use: Never used  Substance Use Topics  . Alcohol use: No  . Drug use: No     Review of Systems  Cardiovascular: No chest pain. Respiratory: No SOB. Gastrointestinal: No abdominal pain.  No nausea, no vomiting.  Musculoskeletal: Positive for forearm pain. Skin: Negative for rash. Positive for large skin tear to right forearm.  Neurological: Negative for headaches, numbness or tingling   ____________________________________________   PHYSICAL EXAM:  VITAL  SIGNS: ED Triage Vitals  Enc Vitals Group     BP 12/07/19 1840 (!) 136/96     Pulse Rate 12/07/19 1837 85     Resp 12/07/19 1837 16     Temp 12/07/19 1840 98.8 F (37.1 C)     Temp Source 12/07/19 1837 Oral     SpO2 12/07/19 1837 96 %     Weight --      Height --      Head Circumference --      Peak Flow --      Pain Score 12/07/19 1838 8     Pain Loc --      Pain Edu? --      Excl. in [place]? --      Constitutional: Alert and oriented. Well appearing and in no acute distress. Eyes: Conjunctivae are normal. PERRL. EOMI. Head: Atraumatic. ENT:      Ears:      Nose: No congestion/rhinnorhea.      Mouth/Throat: Mucous membranes are moist.  Neck: No stridor.  No cervical spine tenderness to palpation. Cardiovascular: Normal rate, regular rhythm.  Good peripheral circulation. Respiratory: Normal respiratory effort without tachypnea or retractions. Lungs CTAB. Good air entry to the bases with no decreased or absent breath sounds. Gastrointestinal: Bowel sounds 4 quadrants. Soft and nontender to palpation. No guarding or rigidity. No palpable masses. No distention.  Musculoskeletal: Full range of motion to all extremities. No gross deformities appreciated. Neurologic:  Normal speech and language. No gross focal neurologic deficits are appreciated.  Skin:  Skin is warm, dry. 6 cm large skin tear to left forearm. Psychiatric: Mood and affect are normal. Speech and behavior are normal. Patient exhibits appropriate insight and judgement.   ____________________________________________   LABS (all labs ordered are listed, but only abnormal results are displayed)  Labs Reviewed  CBC - Abnormal; Notable for the following components:      Result Value   RBC 3.40 (*)    Hemoglobin 11.0 (*)    HCT 32.7 (*)    All other components within normal limits  COMPREHENSIVE METABOLIC PANEL - Abnormal; Notable for the following components:   Glucose, Bld 117 (*)    All other components within  normal limits  PROTIME-INR  CK  URINALYSIS, COMPLETE (UACMP) WITH MICROSCOPIC  TROPONIN I (HIGH SENSITIVITY)  TROPONIN I (HIGH SENSITIVITY)   ____________________________________________  EKG  SR ____________________________________________  RADIOLOGY Robinette Haines, personally viewed and evaluated these images (plain radiographs) as part of my medical decision making, as well as reviewing the written report by the radiologist.  DG Chest 1 View  Result Date: 12/07/2019 CLINICAL DATA:  Status post fall. EXAM: CHEST  1 VIEW COMPARISON:  Oct 28, 2019 FINDINGS: The lungs are hyperinflated. Mild diffuse chronic appearing increased lung markings are seen. There is no evidence of acute infiltrate, pleural effusion or pneumothorax. The heart size and mediastinal contours are within normal limits. There is moderate severity calcification of the aortic arch. The a chronic eighth left rib fracture is noted. Multilevel degenerative changes seen throughout the thoracic spine. IMPRESSION: Findings consistent with COPD without evidence of acute or active cardiopulmonary disease. Electronically Signed   By: Virgina Norfolk M.D.   On: 12/07/2019 19:29   DG Pelvis 1-2 Views  Result Date: 12/07/2019 CLINICAL DATA:  Pain after fall EXAM: PELVIS - 1-2 VIEW COMPARISON:  None. FINDINGS: There is no evidence of pelvic fracture or diastasis. No pelvic bone lesions are seen. IMPRESSION: Negative. Electronically Signed   By: Dorise Bullion III M.D   On: 12/07/2019 19:29   DG Forearm Left  Result Date: 12/07/2019 CLINICAL DATA:  Pain after fall EXAM: LEFT FOREARM - 2 VIEW COMPARISON:  None. FINDINGS: Lucencies projected over the soft tissues of the forearm may represent soft tissue gas in the appropriate clinical setting. No fractures are seen. No other acute abnormalities. IMPRESSION: Air in the soft tissues is not excluded. Recommend correlation for possible lacerations. No fractures identified. Electronically  Signed   By: Dorise Bullion III M.D   On: 12/07/2019 19:35   CT Head Wo Contrast  Result Date: 12/07/2019 CLINICAL DATA:  [place], left periorbital bruising EXAM: CT HEAD WITHOUT CONTRAST TECHNIQUE: Contiguous axial images were obtained from the base of the skull through the vertex without intravenous contrast. COMPARISON:  10/28/2019 FINDINGS: Brain: There continued hypodensities throughout the periventricular white matter and right temporal lobe consistent with chronic ischemic change. No signs of acute infarct or hemorrhage. Lateral ventricles and midline structures are unremarkable. No acute extra-axial fluid collections. No mass  effect. Vascular: No hyperdense vessel or unexpected calcification. Skull: Normal. Negative for fracture or focal lesion. Sinuses/Orbits: No acute finding. Other: None. IMPRESSION: 1. Chronic ischemic changes as above. No acute intracranial process. Electronically Signed   By: Randa Ngo M.D.   On: 12/07/2019 20:12   CT Cervical Spine Wo Contrast  Result Date: 12/07/2019 CLINICAL DATA:  [place], left periorbital bruising EXAM: CT CERVICAL SPINE WITHOUT CONTRAST TECHNIQUE: Multidetector CT imaging of the cervical spine was performed without intravenous contrast. Multiplanar CT image reconstructions were also generated. COMPARISON:  10/28/2019 FINDINGS: Alignment: Alignment is anatomic. Skull base and vertebrae: No acute displaced fractures. Soft tissues and spinal canal: No prevertebral fluid or swelling. No visible canal hematoma. Disc levels: Stable multilevel cervical spondylosis most pronounced at C5-6 and C6-7. There is partial bony fusion across the C3-4 and C4-5 disc spaces. There is mild diffuse facet hypertrophy. No significant bony encroachment on the central canal or neural foramina. Upper chest: Airway is patent.  Lung apices are clear. Other: Reconstructed images demonstrate no additional findings. IMPRESSION: 1. Stable cervical spondylosis and facet hypertrophy. No  acute fracture. Electronically Signed   By: Randa Ngo M.D.   On: 12/07/2019 20:14   CT Maxillofacial Wo Contrast  Result Date: 12/07/2019 CLINICAL DATA:  Status post fall. EXAM: CT MAXILLOFACIAL WITHOUT CONTRAST TECHNIQUE: Multidetector CT imaging of the maxillofacial structures was performed. Multiplanar CT image reconstructions were also generated. COMPARISON:  None. FINDINGS: Osseous: No fracture or mandibular dislocation. No destructive process. Orbits: Negative. No traumatic or inflammatory finding. Sinuses: Clear. Soft tissues: Negative. Limited intracranial: No significant or unexpected finding. IMPRESSION: No acute facial bone fracture. Electronically Signed   By: Virgina Norfolk M.D.   On: 12/07/2019 20:15    ____________________________________________    PROCEDURES  Procedure(s) performed:    Procedures  LACERATION REPAIR Performed by: Laban Emperor  Consent: Verbal consent obtained.  Consent given by: patient  Prepped and Draped in normal sterile fashion  Wound explored: No foreign bodies   Laceration Location: forearm  Laceration Length: 6 cm  Anesthesia: None  Local anesthetic: lidocaine 1% with epinephrine  Anesthetic total: 5 ml  Irrigation method: syringe  Amount of cleaning: 518ml normal saline  Skin closure: 4-0 nylon  Number of sutures: 10  Technique: Simple interrupted  Patient tolerance: Patient tolerated the procedure well with no immediate complications.  Medications  lidocaine-EPINEPHrine-tetracaine (LET) topical gel (3 mLs Topical Given 12/07/19 1852)  lidocaine-EPINEPHrine (XYLOCAINE W/EPI) 2 %-1:100000 (with pres) injection 30 mL (30 mLs Intradermal Given 12/07/19 2052)  ondansetron (ZOFRAN) injection 4 mg (4 mg Intravenous Given 12/07/19 2048)     ____________________________________________   INITIAL IMPRESSION / ASSESSMENT AND PLAN / ED COURSE  Pertinent labs & imaging results that were available during my care of the  patient were reviewed by me and considered in my medical decision making (see chart for details).  Review of the [place] CSRS was performed in accordance of the [place] prior to dispensing any controlled drugs.   Patient presented to the emergency department for evaluation of unwitnessed fall today.  Lab work and images are largely unremarkable.  CT scans and x-rays are negative for acute abnormalities.  SR on EKG. Laceration was repaired with sutures.  Care was transferred to Dr. Joni Fears with plans of discharge after repeat EKG.  Christy Waters was evaluated in Emergency Department on 12/07/2019 for the symptoms described in the history of present illness. She was evaluated in the context of the global COVID-19 pandemic, which necessitated  consideration that the patient might be at risk for infection with the SARS-CoV-2 virus that causes COVID-19. Institutional protocols and algorithms that pertain to the evaluation of patients at risk for COVID-19 are in a state of rapid change based on information released by regulatory bodies including the CDC and federal and state organizations. These policies and algorithms were followed during the patient's care in the ED.   ____________________________________________  FINAL CLINICAL IMPRESSION(S) / ED DIAGNOSES  Final diagnoses:  None      NEW MEDICATIONS STARTED DURING THIS VISIT:  ED Discharge Orders    None          This chart was dictated using voice recognition software/Dragon. Despite best efforts to proofread, errors can occur which can change the meaning. Any change was purely unintentional.    Laban Emperor, PA-C 12/08/19 1217    Carrie Mew, MD 12/08/19 [phone]

## 2019-12-07 NOTE — ED Notes (Signed)
X-ray at bedside

## 2019-12-07 NOTE — ED Triage Notes (Signed)
Pt to ED via [facility] from [facility] for unwitnessed fall. Pt told EMS that she was trying to go from her chair to her bed without assistance and tell. Pt isn't sure if she hit her head. Pt is on Plavix and has a skin tear to the left arm. Bleeding is controlled at this time. Pt is c/o pain in her left arm and pain in the right side of her head. Pt is in NAD at this time.

## 2019-12-07 NOTE — ED Provider Notes (Signed)
Procedures     ----------------------------------------- 10:02 PM on 12/07/2019 -----------------------------------------  Repeat EKG interpreted by me Shows sinus rhythm with 3 PJCs on the strip, overall ventricular rate of 94.  Left axis, normal intervals, narrow QRS, no ischemic changes.    Carrie Mew, MD 12/07/19 2203

## 2019-12-07 NOTE — ED Notes (Signed)
Pt had fall today, does not remember hitting her head but does have a knot with bruising on the left corner or her left eye. Pt also appears to have a small abrasion on the right side of her forehead.   Pt has large skin tear with hematoma on her left forearm. Blood is oozing from wound.

## 2019-12-07 NOTE — ED Notes (Signed)
Pt assisted with repositioning in bed by this nurse, Hob elevated and leg bent. Pt states position is comfortable. Pt continues to speak of various things that occur at [facility] and bounces from one topic to another. Pt awaiting transport, call light in reach, lights dimmed for comfort

## 2019-12-09 DIAGNOSIS — F05 Delirium due to known physiological condition: Secondary | ICD-10-CM

## 2019-12-13 ENCOUNTER — Encounter: Payer: Self-pay | Admitting: Emergency Medicine

## 2019-12-13 ENCOUNTER — Other Ambulatory Visit: Payer: Self-pay

## 2019-12-13 ENCOUNTER — Emergency Department
Admission: EM | Admit: 2019-12-13 | Discharge: 2019-12-13 | Disposition: A | Discharge status: Skilled Nursing Facility | Payer: Medicare Other | Attending: Emergency Medicine | Admitting: Emergency Medicine

## 2019-12-13 DIAGNOSIS — S81811A Laceration without foreign body, right lower leg, initial encounter: Secondary | ICD-10-CM | POA: Diagnosis not present

## 2019-12-13 DIAGNOSIS — X58XXXA Exposure to other specified factors, initial encounter: Secondary | ICD-10-CM | POA: Insufficient documentation

## 2019-12-13 DIAGNOSIS — Y929 Unspecified place or not applicable: Secondary | ICD-10-CM | POA: Insufficient documentation

## 2019-12-13 DIAGNOSIS — Z79899 Other long term (current) drug therapy: Secondary | ICD-10-CM | POA: Insufficient documentation

## 2019-12-13 DIAGNOSIS — S8991XA Unspecified injury of right lower leg, initial encounter: Secondary | ICD-10-CM | POA: Diagnosis present

## 2019-12-13 DIAGNOSIS — Y939 Activity, unspecified: Secondary | ICD-10-CM | POA: Diagnosis not present

## 2019-12-13 DIAGNOSIS — Y999 Unspecified external cause status: Secondary | ICD-10-CM | POA: Diagnosis not present

## 2019-12-13 DIAGNOSIS — Z7982 Long term (current) use of aspirin: Secondary | ICD-10-CM | POA: Diagnosis not present

## 2019-12-13 DIAGNOSIS — Z85828 Personal history of other malignant neoplasm of skin: Secondary | ICD-10-CM | POA: Insufficient documentation

## 2019-12-13 DIAGNOSIS — S81801A Unspecified open wound, right lower leg, initial encounter: Secondary | ICD-10-CM

## 2019-12-13 DIAGNOSIS — Z8673 Personal history of transient ischemic attack (TIA), and cerebral infarction without residual deficits: Secondary | ICD-10-CM | POA: Insufficient documentation

## 2019-12-13 DIAGNOSIS — I10 Essential (primary) hypertension: Secondary | ICD-10-CM | POA: Insufficient documentation

## 2019-12-13 NOTE — ED Notes (Signed)
Attempted to call patient's daughter for transport and discharge without answer x2.

## 2019-12-13 NOTE — ED Provider Notes (Signed)
[facility] Emergency Department Provider Note   ____________________________________________   First MD Initiated Contact with Patient 12/13/19 0411     (approximate)  I have reviewed the triage vital signs and the nursing notes.   HISTORY  Chief Complaint Abrasion    HPI Christy Waters is a 86 y.o. female with past medical history of hypertension, hyperlipidemia, and chronic atrial fibrillation who presents to the ED for skin tear.  Patient currently resides at [facility] facility and was found with her right leg tangled in her bed railing.  She had significant skin tear to her right shin and staff at [facility] was concerned because patient takes Plavix.  EMS was subsequently called to bring the patient to the ED.  Patient currently denies any complaints and is at her reported baseline mental status.        Past Medical History:  Diagnosis Date  . Anemia    distant past  . Arthritis    fingers, hips  . Cancer ([place])    skin cancer; some spots on left side of face; been 5 years since last treated;   . Headache   . History of echocardiogram    a. TTE 04/06/17: EF > 55%, no RWMA, no LA mass, severe TR, RV systolic dysfunction, elevated PASP; b. TTE 04/2017: EF 65-70%, no RWMA, Gr1DD, calcified mitral annulus w/ mild MR, mild to mod dilated LA w/ irregular, echodense material in the LA incompletely visualized, possibly representing artifact vs thrombus, or intracardiac mass, rec TEE, mod TR, mildly dilated RV, inc PASP  . History of nuclear stress test    a. 12/18: no sig ischemia, low risk  . HLD (hyperlipidemia)   . Left atrial mass    a. see echo from 04/2017 - ? mass vs LA thrombus in the setting of PAF -->Eliquis.  . Osteoporosis   . PAF (paroxysmal atrial fibrillation) ([place])    a. diagnosed 05/10/2017; b. CHADS2VASc --> 3 (age x 2, female)--> Eliquis 2.5 (age/wt).    Patient Active Problem List   Diagnosis Date Noted  .  Stroke ([place]) 10/28/2019  . Hypertensive urgency 09/16/2019  . Syncope, vasovagal 09/15/2019  . Syncope and collapse 09/15/2019  . Acute lower UTI 09/15/2019  . Pelvic pain in female 03/20/2018  . Cor pulmonale ([place]) 09/19/2017  . Low serum vitamin D 06/19/2017  . History of atrial fibrillation 06/19/2017  . Chest pain 05/10/2017  . Moderate episode of recurrent major depressive disorder ([place]) 04/04/2017  . Adult idiopathic generalized osteoporosis 04/13/2016  . 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 Irritable bowel syndrome with diarrhea 05/20/2014  . Abdominal pain, generalized 04/01/2014  . Atherosclerosis of aorta ([place]) 03/05/2014  . 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 Idiopathic peripheral neuropathy 03/05/2014  . Abdominal pain of unknown cause 02/06/2014  . MI (mitral incompetence) 02/06/2014  . Stress incontinence 02/06/2014  . Fibrosis of lung ([place]) 10/03/2013  . Generalized OA 10/03/2013  . HLD (hyperlipidemia) 10/03/2013  . BP (high blood pressure) 10/03/2013  . OP (osteoporosis) 10/03/2013  . Beat, premature ventricular 10/03/2013  . Ventricular premature depolarization 10/03/2013    Past Surgical History:  Procedure Laterality Date  . CATARACT EXTRACTION W/PHACO Left 09/14/2015   Procedure: CATARACT EXTRACTION PHACO AND INTRAOCULAR LENS PLACEMENT (IOC);  Surgeon: Ronnell Freshwater, MD;  Location: [place];  Service: Ophthalmology;  Laterality: Left;  [place] Right    cataract surgery schedule for Easter Monday 2017;  Prior to Admission medications   Medication Sig Start Date End Date Taking? Authorizing Provider  acetaminophen (TYLENOL) 325 MG tablet Take 650 mg by mouth every 4 (four) hours as needed for mild pain or moderate pain.    [provider]  amitriptyline (ELAVIL) 10 MG tablet Take 5 mg by mouth at bedtime.     [provider]  aspirin EC 81 MG tablet Take 81 mg by mouth daily. Swallow whole.     [provider]  atorvastatin (LIPITOR) 80 MG tablet Take 1 tablet (80 mg total) by mouth daily. 11/01/19 01/30/20  Barb Merino, MD  Cholecalciferol (VITAMIN D3) 50 MCG (2000 UT) TABS Take 2,000 Units by mouth at bedtime.     [provider]  clopidogrel (PLAVIX) 75 MG tablet Take 1 tablet (75 mg total) by mouth daily. 11/01/19 01/30/20  Barb Merino, MD  dicyclomine (BENTYL) 10 MG capsule Take 10 mg by mouth every 6 (six) hours as needed (abdominal pain/cramping).  09/16/19   [provider]  LUMIGAN 0.01 % SOLN Place 1 drop into both eyes at bedtime.     [provider]  miconazole (MICOTIN) 2 % cream Apply 1 application topically 2 (two) times daily. (affected area on back)    [provider]  olmesartan (BENICAR) 5 MG tablet Take 5 mg by mouth at bedtime.     [provider]  PARoxetine (PAXIL) 20 MG tablet Take 20 mg by mouth daily.     [provider]  potassium chloride (KLOR-CON) 10 MEQ tablet Take 10 mEq by mouth daily.    [provider]  Probiotic Product (ALIGN) 4 MG CAPS Take 4 mg by mouth daily.     [provider]    Allergies Azithromycin, Clindamycin, Clindamycin/lincomycin, Hyoscyamine, Lincomycin, Maxitrol [neomycin-polymyxin-dexameth], Other, Timolol, Codeine, and Penicillins  Family History  Problem Relation Age of Onset  . CAD Mother   . Arrhythmia Mother   . Liver cancer Father     Social History Social History   Tobacco Use  . Smoking status: Never Smoker  . Smokeless tobacco: Never Used  Vaping Use  . Vaping Use: Never used  Substance Use Topics  . Alcohol use: No  . Drug use: No    Review of Systems  Constitutional: No fever/chills Eyes: No visual changes. ENT: No sore throat. Cardiovascular: Denies chest pain. Respiratory: Denies shortness of breath. Gastrointestinal: No abdominal pain.  No nausea, no vomiting.  No diarrhea.  No constipation. Genitourinary: Negative for  dysuria. Musculoskeletal: Negative for back pain. Skin: Negative for rash.  Positive for skin tear. Neurological: Negative for headaches, focal weakness or numbness.  ____________________________________________   PHYSICAL EXAM:  VITAL SIGNS: ED Triage Vitals  Enc Vitals Group     BP 12/13/19 0410 116/79     Pulse Rate 12/13/19 0410 88     Resp 12/13/19 0410 16     Temp 12/13/19 0410 98.7 F (37.1 C)     Temp Source 12/13/19 0410 Oral     SpO2 12/13/19 0410 96 %     Weight 12/13/19 0411 119 lb 0.8 oz (54 kg)     Height 12/13/19 0411 5\' 6"  (1.676 m)     Head Circumference --      Peak Flow --      Pain Score 12/13/19 0411 10     Pain Loc --      Pain Edu? --      Excl. in [place]? --  Constitutional: Alert and oriented to person and place, but not time. Eyes: Conjunctivae are normal. Head: Atraumatic. Nose: No congestion/rhinnorhea. Mouth/Throat: Mucous membranes are moist. Neck: Normal ROM Cardiovascular: Normal rate, regular rhythm. Grossly normal heart sounds. Respiratory: Normal respiratory effort.  No retractions. Lungs CTAB. Gastrointestinal: Soft and nontender. No distention. Genitourinary: deferred Musculoskeletal: No lower extremity tenderness nor edema.  Dressing in place to left upper extremity from prior fall. Neurologic:  Normal speech and language. No gross focal neurologic deficits are appreciated. Skin: Large skin tear to right anterior shin with surrounding clotted blood but no active bleeding. Psychiatric: Mood and affect are normal. Speech and behavior are normal.  ____________________________________________   LABS (all labs ordered are listed, but only abnormal results are displayed)  Labs Reviewed - No data to display   PROCEDURES  Procedure(s) performed (including Critical Care):  Procedures   ____________________________________________   INITIAL IMPRESSION / ASSESSMENT AND PLAN / ED COURSE       86 year old female with history  of hypertension, hyperlipidemia, and atrial fibrillation presents to the ED after being found tangled in her bed railing with significant skin tear to right anterior shin.  No active bleeding from this large skin tear, we will clean wound and apply nonadherent dressing.  She has full range of motion in her right knee and ankle and I doubt acute bony injury.  Patient did not fall from bed or hit her head.  Once wound is stressed she will be appropriate for discharge back to nursing facility.  Wound is dressed and patient remains calm and comfortable here in the ED.  She is appropriate for discharge back to nursing facility.      ____________________________________________   FINAL CLINICAL IMPRESSION(S) / ED DIAGNOSES  Final diagnoses:  Skin tear of lower leg without complication, right, initial encounter     ED Discharge Orders    None       Note:  This document was prepared using Dragon voice recognition software and may include unintentional dictation errors.   Blake Divine, MD 12/13/19 [phone]

## 2019-12-13 NOTE — ED Triage Notes (Signed)
Pt to ED via EMS from [facility] c/o right leg abrasion and skin tear.  EMS states patient's leg got caught between bed rail.  Large bruise and skin tear below right knee, patient on Plavix, pt presents A&Ox2 at baseline.  Wound cleaned and dressed.

## 2019-12-19 ENCOUNTER — Telehealth: Payer: Self-pay | Admitting: Internal Medicine

## 2019-12-19 NOTE — Telephone Encounter (Signed)
I'm the one that ordered  the referral. Yes, Letvak will be the attending.

## 2019-12-19 NOTE — Telephone Encounter (Signed)
Since Dr.Letvak will be out of the office until Monday, [place] asked for [name] to respond.

## 2019-12-19 NOTE — Telephone Encounter (Signed)
Krystal notified Dr.Letvak will be the attending physician.

## 2019-12-19 NOTE — Telephone Encounter (Signed)
Albina Billet, [facility], called.  Krystal's asking if Dr.Letvak will be the attending physician and if he agrees with the referral that patient is terminally ill.  A detailed message may be left on Krystal's voice mail.

## 2019-12-20 DIAGNOSIS — B351 Tinea unguium: Secondary | ICD-10-CM

## 2019-12-23 DIAGNOSIS — G8194 Hemiplegia, unspecified affecting left nondominant side: Secondary | ICD-10-CM

## 2019-12-23 DIAGNOSIS — F39 Unspecified mood [affective] disorder: Secondary | ICD-10-CM

## 2019-12-23 DIAGNOSIS — I480 Paroxysmal atrial fibrillation: Secondary | ICD-10-CM

## 2019-12-23 DIAGNOSIS — F0150 Vascular dementia without behavioral disturbance: Secondary | ICD-10-CM

## 2019-12-27 DIAGNOSIS — K1370 Unspecified lesions of oral mucosa: Secondary | ICD-10-CM | POA: Diagnosis not present

## 2020-01-08 DIAGNOSIS — I4891 Unspecified atrial fibrillation: Secondary | ICD-10-CM | POA: Diagnosis not present

## 2020-01-08 DIAGNOSIS — J8410 Pulmonary fibrosis, unspecified: Secondary | ICD-10-CM

## 2020-01-08 DIAGNOSIS — I69351 Hemiplegia and hemiparesis following cerebral infarction affecting right dominant side: Secondary | ICD-10-CM | POA: Diagnosis not present

## 2020-01-08 DIAGNOSIS — F39 Unspecified mood [affective] disorder: Secondary | ICD-10-CM | POA: Diagnosis not present

## 2020-01-08 DIAGNOSIS — F0150 Vascular dementia without behavioral disturbance: Secondary | ICD-10-CM

## 2020-01-08 DIAGNOSIS — R109 Unspecified abdominal pain: Secondary | ICD-10-CM

## 2020-02-13 DIAGNOSIS — J9611 Chronic respiratory failure with hypoxia: Secondary | ICD-10-CM

## 2020-02-13 DIAGNOSIS — F0150 Vascular dementia without behavioral disturbance: Secondary | ICD-10-CM

## 2020-02-13 DIAGNOSIS — F39 Unspecified mood [affective] disorder: Secondary | ICD-10-CM | POA: Diagnosis not present

## 2020-02-13 DIAGNOSIS — G8194 Hemiplegia, unspecified affecting left nondominant side: Secondary | ICD-10-CM | POA: Diagnosis not present

## 2020-02-13 DIAGNOSIS — J8410 Pulmonary fibrosis, unspecified: Secondary | ICD-10-CM

## 2020-02-13 DIAGNOSIS — E441 Mild protein-calorie malnutrition: Secondary | ICD-10-CM | POA: Diagnosis not present

## 2020-02-13 DIAGNOSIS — I480 Paroxysmal atrial fibrillation: Secondary | ICD-10-CM

## 2020-03-15 ENCOUNTER — Other Ambulatory Visit: Payer: Self-pay

## 2020-03-15 ENCOUNTER — Inpatient Hospital Stay

## 2020-03-15 ENCOUNTER — Emergency Department

## 2020-03-15 ENCOUNTER — Inpatient Hospital Stay
Admission: EM | Admit: 2020-03-15 | Discharge: 2020-03-18 | DRG: 064 | Disposition: A | Discharge status: Skilled Nursing Facility | Source: Skilled Nursing Facility | Attending: Internal Medicine | Admitting: Internal Medicine

## 2020-03-15 DIAGNOSIS — E785 Hyperlipidemia, unspecified: Secondary | ICD-10-CM | POA: Diagnosis present

## 2020-03-15 DIAGNOSIS — Z823 Family history of stroke: Secondary | ICD-10-CM

## 2020-03-15 DIAGNOSIS — Z87440 Personal history of urinary (tract) infections: Secondary | ICD-10-CM | POA: Diagnosis not present

## 2020-03-15 DIAGNOSIS — M24542 Contracture, left hand: Secondary | ICD-10-CM | POA: Diagnosis present

## 2020-03-15 DIAGNOSIS — R471 Dysarthria and anarthria: Secondary | ICD-10-CM | POA: Diagnosis present

## 2020-03-15 DIAGNOSIS — I69354 Hemiplegia and hemiparesis following cerebral infarction affecting left non-dominant side: Secondary | ICD-10-CM | POA: Diagnosis not present

## 2020-03-15 DIAGNOSIS — N390 Urinary tract infection, site not specified: Secondary | ICD-10-CM | POA: Diagnosis present

## 2020-03-15 DIAGNOSIS — R1310 Dysphagia, unspecified: Secondary | ICD-10-CM | POA: Diagnosis present

## 2020-03-15 DIAGNOSIS — I480 Paroxysmal atrial fibrillation: Secondary | ICD-10-CM | POA: Diagnosis present

## 2020-03-15 DIAGNOSIS — F331 Major depressive disorder, recurrent, moderate: Secondary | ICD-10-CM | POA: Diagnosis present

## 2020-03-15 DIAGNOSIS — G9341 Metabolic encephalopathy: Secondary | ICD-10-CM | POA: Diagnosis present

## 2020-03-15 DIAGNOSIS — E870 Hyperosmolality and hypernatremia: Secondary | ICD-10-CM | POA: Diagnosis present

## 2020-03-15 DIAGNOSIS — Z515 Encounter for palliative care: Secondary | ICD-10-CM | POA: Diagnosis not present

## 2020-03-15 DIAGNOSIS — Z85828 Personal history of other malignant neoplasm of skin: Secondary | ICD-10-CM | POA: Diagnosis not present

## 2020-03-15 DIAGNOSIS — R64 Cachexia: Secondary | ICD-10-CM | POA: Diagnosis present

## 2020-03-15 DIAGNOSIS — G319 Degenerative disease of nervous system, unspecified: Secondary | ICD-10-CM | POA: Diagnosis present

## 2020-03-15 DIAGNOSIS — Z961 Presence of intraocular lens: Secondary | ICD-10-CM | POA: Diagnosis present

## 2020-03-15 DIAGNOSIS — Z681 Body mass index (BMI) 19 or less, adult: Secondary | ICD-10-CM | POA: Diagnosis not present

## 2020-03-15 DIAGNOSIS — Z9842 Cataract extraction status, left eye: Secondary | ICD-10-CM

## 2020-03-15 DIAGNOSIS — N3000 Acute cystitis without hematuria: Secondary | ICD-10-CM | POA: Diagnosis present

## 2020-03-15 DIAGNOSIS — Z880 Allergy status to penicillin: Secondary | ICD-10-CM

## 2020-03-15 DIAGNOSIS — R4182 Altered mental status, unspecified: Secondary | ICD-10-CM

## 2020-03-15 DIAGNOSIS — I482 Chronic atrial fibrillation, unspecified: Secondary | ICD-10-CM | POA: Diagnosis present

## 2020-03-15 DIAGNOSIS — R636 Underweight: Secondary | ICD-10-CM | POA: Diagnosis present

## 2020-03-15 DIAGNOSIS — I639 Cerebral infarction, unspecified: Principal | ICD-10-CM | POA: Diagnosis present

## 2020-03-15 DIAGNOSIS — I700 Atherosclerosis of aorta: Secondary | ICD-10-CM | POA: Diagnosis present

## 2020-03-15 DIAGNOSIS — N179 Acute kidney failure, unspecified: Secondary | ICD-10-CM | POA: Diagnosis present

## 2020-03-15 DIAGNOSIS — E876 Hypokalemia: Secondary | ICD-10-CM | POA: Diagnosis present

## 2020-03-15 DIAGNOSIS — Z8249 Family history of ischemic heart disease and other diseases of the circulatory system: Secondary | ICD-10-CM

## 2020-03-15 DIAGNOSIS — B9620 Unspecified Escherichia coli [E. coli] as the cause of diseases classified elsewhere: Secondary | ICD-10-CM | POA: Diagnosis present

## 2020-03-15 DIAGNOSIS — I672 Cerebral atherosclerosis: Secondary | ICD-10-CM | POA: Diagnosis present

## 2020-03-15 DIAGNOSIS — Z885 Allergy status to narcotic agent status: Secondary | ICD-10-CM

## 2020-03-15 DIAGNOSIS — Z7189 Other specified counseling: Secondary | ICD-10-CM | POA: Diagnosis not present

## 2020-03-15 DIAGNOSIS — Z20822 Contact with and (suspected) exposure to covid-19: Secondary | ICD-10-CM | POA: Diagnosis present

## 2020-03-15 DIAGNOSIS — I6523 Occlusion and stenosis of bilateral carotid arteries: Secondary | ICD-10-CM | POA: Diagnosis present

## 2020-03-15 DIAGNOSIS — Z7401 Bed confinement status: Secondary | ICD-10-CM

## 2020-03-15 DIAGNOSIS — Z79899 Other long term (current) drug therapy: Secondary | ICD-10-CM

## 2020-03-15 DIAGNOSIS — Z66 Do not resuscitate: Secondary | ICD-10-CM | POA: Diagnosis present

## 2020-03-15 DIAGNOSIS — I10 Essential (primary) hypertension: Secondary | ICD-10-CM | POA: Diagnosis present

## 2020-03-15 DIAGNOSIS — E869 Volume depletion, unspecified: Secondary | ICD-10-CM | POA: Diagnosis present

## 2020-03-15 DIAGNOSIS — Z7902 Long term (current) use of antithrombotics/antiplatelets: Secondary | ICD-10-CM

## 2020-03-15 DIAGNOSIS — E782 Mixed hyperlipidemia: Secondary | ICD-10-CM

## 2020-03-15 DIAGNOSIS — R29702 NIHSS score 2: Secondary | ICD-10-CM | POA: Diagnosis present

## 2020-03-15 DIAGNOSIS — Z888 Allergy status to other drugs, medicaments and biological substances status: Secondary | ICD-10-CM

## 2020-03-15 LAB — CBC WITH DIFFERENTIAL/PLATELET
Abs Immature Granulocytes: 0.03 10*3/uL (ref 0.00–0.07)
Basophils Absolute: 0 10*3/uL (ref 0.0–0.1)
Basophils Relative: 0 %
Eosinophils Absolute: 0 10*3/uL (ref 0.0–0.5)
Eosinophils Relative: 0 %
HCT: 29.4 % — ABNORMAL LOW (ref 36.0–46.0)
Hemoglobin: 9.7 g/dL — ABNORMAL LOW (ref 12.0–15.0)
Immature Granulocytes: 0 %
Lymphocytes Relative: 11 %
Lymphs Abs: 0.8 10*3/uL (ref 0.7–4.0)
MCH: 30.8 pg (ref 26.0–34.0)
MCHC: 33 g/dL (ref 30.0–36.0)
MCV: 93.3 fL (ref 80.0–100.0)
Monocytes Absolute: 0.2 10*3/uL (ref 0.1–1.0)
Monocytes Relative: 4 %
Neutro Abs: 5.7 10*3/uL (ref 1.7–7.7)
Neutrophils Relative %: 85 %
Platelets: 268 10*3/uL (ref 150–400)
RBC: 3.15 MIL/uL — ABNORMAL LOW (ref 3.87–5.11)
RDW: 14.5 % (ref 11.5–15.5)
WBC: 6.8 10*3/uL (ref 4.0–10.5)
nRBC: 0 % (ref 0.0–0.2)

## 2020-03-15 LAB — URINALYSIS, COMPLETE (UACMP) WITH MICROSCOPIC
Bilirubin Urine: NEGATIVE
Glucose, UA: 50 mg/dL — AB
Hgb urine dipstick: NEGATIVE
Ketones, ur: NEGATIVE mg/dL
Nitrite: NEGATIVE
Protein, ur: >=300 mg/dL — AB
Specific Gravity, Urine: 1.015 (ref 1.005–1.030)
WBC, UA: >50 WBC/hpf — ABNORMAL HIGH (ref 0–5)
pH: 5 (ref 5.0–8.0)

## 2020-03-15 LAB — COMPREHENSIVE METABOLIC PANEL
ALT: 12 U/L (ref 0–44)
AST: 32 U/L (ref 15–41)
Albumin: 3.6 g/dL (ref 3.5–5.0)
Alkaline Phosphatase: 64 U/L (ref 38–126)
Anion gap: 13 (ref 5–15)
BUN: 12 mg/dL (ref 8–23)
CO2: 25 mmol/L (ref 22–32)
Calcium: 9.5 mg/dL (ref 8.9–10.3)
Chloride: 103 mmol/L (ref 98–111)
Creatinine, Ser: 0.94 mg/dL (ref 0.44–1.00)
GFR, Estimated: 50 mL/min — ABNORMAL LOW (ref >60)
Glucose, Bld: 137 mg/dL — ABNORMAL HIGH (ref 70–99)
Potassium: 3.4 mmol/L — ABNORMAL LOW (ref 3.5–5.1)
Sodium: 141 mmol/L (ref 135–145)
Total Bilirubin: 1 mg/dL (ref 0.3–1.2)
Total Protein: 6.6 g/dL (ref 6.5–8.1)

## 2020-03-15 LAB — LACTIC ACID, PLASMA
Lactic Acid, Venous: 3.6 mmol/L (ref 0.5–1.9)
Lactic Acid, Venous: 1.4 mmol/L (ref 0.5–1.9)

## 2020-03-15 LAB — RESPIRATORY PANEL BY RT PCR (FLU A&B, COVID)
Influenza A by PCR: NEGATIVE
Influenza B by PCR: NEGATIVE
SARS Coronavirus 2 by RT PCR: NEGATIVE

## 2020-03-15 MED ORDER — AMITRIPTYLINE HCL 10 MG PO TABS: 5.0000 mg | ORAL_TABLET | Freq: Every day | ORAL | Status: DC

## 2020-03-15 MED ORDER — SENNOSIDES-DOCUSATE SODIUM 8.6-50 MG PO TABS: 1.0000 | ORAL_TABLET | Freq: Every evening | ORAL | Status: DC | PRN

## 2020-03-15 MED ORDER — LATANOPROST 0.005 % OP SOLN
1.0000 [drp] | Freq: Every day | OPHTHALMIC | Status: DC
  Filled 2020-03-15 (×2): qty 2.5

## 2020-03-15 MED ORDER — SODIUM CHLORIDE 0.9 % IV SOLN: INTRAVENOUS | Status: DC

## 2020-03-15 MED ORDER — SODIUM CHLORIDE 0.9 % IV BOLUS
500.0000 mL | Freq: Once | INTRAVENOUS | Status: AC
  Administered 2020-03-15: 500 mL via INTRAVENOUS

## 2020-03-15 MED ORDER — PAROXETINE HCL 20 MG PO TABS
20.0000 mg | ORAL_TABLET | Freq: Every day | ORAL | Status: DC
  Administered 2020-03-17 – 2020-03-18 (×2): 20 mg via ORAL
  Filled 2020-03-15 (×3): qty 1

## 2020-03-15 MED ORDER — SODIUM CHLORIDE 0.9 % IV SOLN
1.0000 g | INTRAVENOUS | Status: DC
  Administered 2020-03-17: 1 g via INTRAVENOUS
  Filled 2020-03-15: qty 10
  Filled 2020-03-15: qty 1

## 2020-03-15 MED ORDER — ASPIRIN 81 MG PO CHEW: 324.0000 mg | CHEWABLE_TABLET | Freq: Once | ORAL | Status: DC

## 2020-03-15 MED ORDER — LACTATED RINGERS IV BOLUS
1000.0000 mL | Freq: Once | INTRAVENOUS | Status: AC
  Administered 2020-03-15: 1000 mL via INTRAVENOUS

## 2020-03-15 MED ORDER — ACETAMINOPHEN 650 MG RE SUPP: 650.0000 mg | RECTAL | Status: DC | PRN

## 2020-03-15 MED ORDER — STROKE: EARLY STAGES OF RECOVERY BOOK: Freq: Once | Status: DC

## 2020-03-15 MED ORDER — SODIUM CHLORIDE 0.9 % IV SOLN
1.0000 g | Freq: Once | INTRAVENOUS | Status: AC
  Administered 2020-03-15: 1 g via INTRAVENOUS
  Filled 2020-03-15: qty 10

## 2020-03-15 MED ORDER — HEPARIN SODIUM (PORCINE) 5000 UNIT/ML IJ SOLN
5000.0000 [IU] | Freq: Three times a day (TID) | INTRAMUSCULAR | Status: DC
  Administered 2020-03-16 – 2020-03-17 (×4): 5000 [IU] via SUBCUTANEOUS
  Filled 2020-03-15 (×5): qty 1

## 2020-03-15 MED ORDER — ATORVASTATIN CALCIUM 20 MG PO TABS: 80.0000 mg | ORAL_TABLET | Freq: Every day | ORAL | Status: DC

## 2020-03-15 MED ORDER — ACETAMINOPHEN 160 MG/5ML PO SOLN
650.0000 mg | ORAL | Status: DC | PRN
  Filled 2020-03-15: qty 20.3

## 2020-03-15 MED ORDER — ACETAMINOPHEN 325 MG PO TABS
650.0000 mg | ORAL_TABLET | ORAL | Status: DC | PRN
  Administered 2020-03-17 – 2020-03-18 (×3): 650 mg via ORAL
  Filled 2020-03-15 (×3): qty 2

## 2020-03-15 NOTE — ED Triage Notes (Signed)
Per EMS, called to [facility] d/t AMS and per staff, BP "70/palp". Per daughter, pt is normally a&ox4, but right now is confused. Pt has had stroke in past. Pt is a DNR.

## 2020-03-15 NOTE — ED Notes (Signed)
Lab at bedside obtaining 2nd set of cultures

## 2020-03-15 NOTE — ED Notes (Signed)
Date and time results received: 03/15/20    Test: Lactic Critical Value: 3.6  Name of Provider Notified: Charna Archer  Orders Received? Or Actions Taken?: New orders received

## 2020-03-15 NOTE — ED Notes (Signed)
Patient transported to MRI 

## 2020-03-15 NOTE — ED Notes (Signed)
Lab called to assist with second set of BC's

## 2020-03-15 NOTE — ED Provider Notes (Signed)
[facility] Emergency Department Provider Note   ____________________________________________   First MD Initiated Contact with Patient 03/15/20 1254     (approximate)  I have reviewed the triage vital signs and the nursing notes.   HISTORY  Chief Complaint Altered Mental Status    HPI Christy Waters is a 86 y.o. female with past medical history of hypertension, hyperlipidemia, and chronic atrial fibrillation who presents to the ED for altered mental status.  Patient arrives from [place] facility and history is obtained from EMS given patient's altered mental status.  Daughter reportedly found patient to be confused from her baseline today, she is reportedly alert and oriented x4 at baseline but had a syncopal episode today and then seemed to be confused.  EMS reports that patient's gaze was locked when they first arrived, but when they transitioned her to the stretcher she became more alert and conversant.  She is currently oriented to person only, complains of pain but is unable to localize this.  Her blood pressure was reportedly low at nursing facility, but normalized with EMS without intervention.  EMS reports she has a prior stroke with left-sided deficits.        Past Medical History:  Diagnosis Date  . Anemia    distant past  . Arthritis    fingers, hips  . Cancer ([place])    skin cancer; some spots on left side of face; been 5 years since last treated;   . Headache   . History of echocardiogram    a. TTE 04/06/17: EF > 55%, no RWMA, no LA mass, severe TR, RV systolic dysfunction, elevated PASP; b. TTE 04/2017: EF 65-70%, no RWMA, Gr1DD, calcified mitral annulus w/ mild MR, mild to mod dilated LA w/ irregular, echodense material in the LA incompletely visualized, possibly representing artifact vs thrombus, or intracardiac mass, rec TEE, mod TR, mildly dilated RV, inc PASP  . History of nuclear stress test    a. 12/18: no sig  ischemia, low risk  . HLD (hyperlipidemia)   . Left atrial mass    a. see echo from 04/2017 - ? mass vs LA thrombus in the setting of PAF -->Eliquis.  . Osteoporosis   . PAF (paroxysmal atrial fibrillation) ([place])    a. diagnosed 05/10/2017; b. CHADS2VASc --> 3 (age x 2, female)--> Eliquis 2.5 (age/wt).    Patient Active Problem List   Diagnosis Date Noted  . Acute CVA (cerebrovascular accident) ([place]) 03/15/2020  . Stroke ([place]) 10/28/2019  . Hypertensive urgency 09/16/2019  . Syncope, vasovagal 09/15/2019  . Syncope and collapse 09/15/2019  . Acute lower UTI 09/15/2019  . Pelvic pain in female 03/20/2018  . Cor pulmonale ([place]) 09/19/2017  . Low serum vitamin D 06/19/2017  . History of atrial fibrillation 06/19/2017  . Chest pain 05/10/2017  . Moderate episode of recurrent major depressive disorder ([place]) 04/04/2017  . Adult idiopathic generalized osteoporosis 04/13/2016  . 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 Irritable bowel syndrome with diarrhea 05/20/2014  . Abdominal pain, generalized 04/01/2014  . Atherosclerosis of aorta ([place]) 03/05/2014  . 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 Idiopathic peripheral neuropathy 03/05/2014  . Abdominal pain of unknown cause 02/06/2014  . MI (mitral incompetence) 02/06/2014  . Stress incontinence 02/06/2014  . Fibrosis of lung ([place]) 10/03/2013  . Generalized OA 10/03/2013  . HLD (hyperlipidemia) 10/03/2013  . BP (high blood pressure) 10/03/2013  . OP (osteoporosis) 10/03/2013  . Beat, premature ventricular 10/03/2013  . Ventricular premature depolarization 10/03/2013  Past Surgical History:  Procedure Laterality Date  . CATARACT EXTRACTION W/PHACO Left 09/14/2015   Procedure: CATARACT EXTRACTION PHACO AND INTRAOCULAR LENS PLACEMENT (IOC);  Surgeon: Ronnell Freshwater, MD;  Location: [place];  Service: Ophthalmology;  Laterality: Left;  [place] Right    cataract surgery schedule for Easter Monday 2017;      Prior to Admission medications   Medication Sig Start Date End Date Taking? Authorizing Provider  acetaminophen (TYLENOL) 325 MG tablet Take 650 mg by mouth every 4 (four) hours as needed for mild pain or moderate pain.    [provider]  amitriptyline (ELAVIL) 10 MG tablet Take 5 mg by mouth at bedtime.     [provider]  aspirin EC 81 MG tablet Take 81 mg by mouth daily. Swallow whole.    [provider]  atorvastatin (LIPITOR) 80 MG tablet Take 1 tablet (80 mg total) by mouth daily. 11/01/19 01/30/20  Barb Merino, MD  Cholecalciferol (VITAMIN D3) 50 MCG (2000 UT) TABS Take 2,000 Units by mouth at bedtime.     [provider]  dicyclomine (BENTYL) 10 MG capsule Take 10 mg by mouth every 6 (six) hours as needed (abdominal pain/cramping).  09/16/19   [provider]  LUMIGAN 0.01 % SOLN Place 1 drop into both eyes at bedtime.     [provider]  miconazole (MICOTIN) 2 % cream Apply 1 application topically 2 (two) times daily. (affected area on back)    [provider]  olmesartan (BENICAR) 5 MG tablet Take 5 mg by mouth at bedtime.     [provider]  PARoxetine (PAXIL) 20 MG tablet Take 20 mg by mouth daily.     [provider]  potassium chloride (KLOR-CON) 10 MEQ tablet Take 10 mEq by mouth daily.    [provider]  Probiotic Product (ALIGN) 4 MG CAPS Take 4 mg by mouth daily.     [provider]    Allergies Azithromycin, Clindamycin, Clindamycin/lincomycin, Hyoscyamine, Lincomycin, Maxitrol [neomycin-polymyxin-dexameth], Other, Timolol, Codeine, and Penicillins  Family History  Problem Relation Age of Onset  . CAD Mother   . Arrhythmia Mother   . Liver cancer Father     Social History Social History   Tobacco Use  . Smoking status: Never Smoker  . Smokeless tobacco: Never Used  Vaping Use  . Vaping Use: Never used  Substance Use Topics  . Alcohol use: No  . Drug use:  No    Review of Systems Unable to obtain secondary to altered mental status  ____________________________________________   PHYSICAL EXAM:  VITAL SIGNS: ED Triage Vitals  Enc Vitals Group     BP      Pulse      Resp      Temp      Temp src      SpO2      Weight      Height      Head Circumference      Peak Flow      Pain Score      Pain Loc      Pain Edu?      Excl. in [place]?     Constitutional: Alert and oriented to person, but not place or time. Eyes: Conjunctivae are normal. Head: Atraumatic. Nose: No congestion/rhinnorhea. Mouth/Throat: Mucous membranes are dry. Neck: Normal ROM Cardiovascular: Normal rate, irregularly irregular rhythm. Grossly normal heart sounds. Respiratory: Normal respiratory effort.  No retractions. Lungs CTAB.  Gastrointestinal: Soft and nontender. No distention. Genitourinary: deferred Musculoskeletal: No lower extremity tenderness nor edema. Neurologic:  Normal speech and language.  Left-sided deficits at baseline per EMS, otherwise no focal deficits. Skin:  Skin is warm, dry and intact. No rash noted. Psychiatric: Mood and affect are normal. Speech and behavior are normal.  ____________________________________________   LABS (all labs ordered are listed, but only abnormal results are displayed)  Labs Reviewed  COMPREHENSIVE METABOLIC PANEL - Abnormal; Notable for the following components:      Result Value   Potassium 3.4 (*)    Glucose, Bld 137 (*)    GFR, Estimated 50 (*)    All other components within normal limits  CBC WITH DIFFERENTIAL/PLATELET - Abnormal; Notable for the following components:   RBC 3.15 (*)    Hemoglobin 9.7 (*)    HCT 29.4 (*)    All other components within normal limits  URINALYSIS, COMPLETE (UACMP) WITH MICROSCOPIC - Abnormal; Notable for the following components:   Color, Urine YELLOW (*)    APPearance TURBID (*)    Glucose, UA 50 (*)    Protein, ur >=300 (*)    Leukocytes,Ua MODERATE (*)    WBC,  UA >50 (*)    Bacteria, UA FEW (*)    Non Squamous Epithelial PRESENT (*)    All other components within normal limits  LACTIC ACID, PLASMA - Abnormal; Notable for the following components:   Lactic Acid, Venous 3.6 (*)    All other components within normal limits  RESPIRATORY PANEL BY RT PCR (FLU A&B, COVID)  CULTURE, BLOOD (ROUTINE X 2)  CULTURE, BLOOD (ROUTINE X 2)  LACTIC ACID, PLASMA   ____________________________________________   PROCEDURES  Procedure(s) performed (including Critical Care):  Procedures   ____________________________________________   INITIAL IMPRESSION / ASSESSMENT AND PLAN / ED COURSE       86 year old female with past medical history of hypertension, hyperlipidemia, and chronic atrial fibrillation presents to the ED for altered mental status noted by daughter earlier today.  Her time of last known well is unclear but I have a low suspicion for stroke given she does not have any new focal deficits on exam, left-sided deficits at baseline per EMS.  We will further assess with CT, would also consider infectious process or metabolic abnormality.  CT head is consistent with left corona radiata stroke, acute versus subacute.  Per daughter, her last known well time is sometime yesterday evening so she would not be a candidate for TPA and I doubt large vessel occlusion.  UA does appear consistent with UTI, which could be contributing to her altered mental status and we will treat with Rocephin.  Lab work shows elevated lactate, we will hydrate with IV fluids and recheck.  In further discussion with daughter, she wishes for patient to be admitted for treatment of UTI despite her hospice status.  Case discussed with hospitalist for admission.      ____________________________________________   FINAL CLINICAL IMPRESSION(S) / ED DIAGNOSES  Final diagnoses:  Altered mental status, unspecified altered mental status type  Acute cystitis without hematuria   Cerebrovascular accident (CVA), unspecified mechanism [facility])     ED Discharge Orders    None       Note:  This document was prepared using Dragon voice recognition software and may include unintentional dictation errors.   Blake Divine, MD 03/15/20 1530

## 2020-03-15 NOTE — H&P (Signed)
History and Physical   Christy Waters WIO:[id number] DOB: Apr 12, 1919 DOA: 03/15/2020  Referring MD/NP/PA: Dr. Charna Archer PCP: Rusty Aus, MD  Patient coming from: [facility])  Chief Complaint: Lethargy  HPI: Christy Waters is a 86 y.o. female with a history of right ACA, MCA CVA with residual left hemiparesis, HLD, PAF no longer on eliquis who presented from [facility] 10/17 for confusion and question of gaze deviation as well as reported soft blood pressures. The patient was only oriented to self on arrival but has since gradually become more aware, still not at baseline per her daughter at the bedside. She denies new weakness or numbness, though CT demonstrated acute vs. subacute CVA in the corona radiata. Urinalysis suggested UTI, WBC 6.8k though lactic acid was 3.6 for which ceftriaxone and 1L LR was given and cultures drawn. Hospitalists called to admit for UTI and CVA. She receives palliative care services per her daughter and is DNR but does not wish for restrictions in care at this time.   Review of Systems: Denies fever, chills, changes in vision or hearing, headache, neck stiffness, cough, sore throat, chest pain, palpitations, shortness of breath, abdominal pain, nausea, vomiting, changes in bowel habits, blood in stool, change in bladder habits, myalgias, arthralgias, rash and per HPI. All others reviewed and are negative.   Past Medical History:  Diagnosis Date  . Anemia    distant past  . Arthritis    fingers, hips  . Cancer ([place])    skin cancer; some spots on left side of face; been 5 years since last treated;   . Headache   . History of echocardiogram    a. TTE 04/06/17: EF > 55%, no RWMA, no LA mass, severe TR, RV systolic dysfunction, elevated PASP; b. TTE 04/2017: EF 65-70%, no RWMA, Gr1DD, calcified mitral annulus w/ mild MR, mild to mod dilated LA w/ irregular, echodense material in the LA incompletely visualized, possibly representing artifact vs thrombus,  or intracardiac mass, rec TEE, mod TR, mildly dilated RV, inc PASP  . History of nuclear stress test    a. 12/18: no sig ischemia, low risk  . HLD (hyperlipidemia)   . Left atrial mass    a. see echo from 04/2017 - ? mass vs LA thrombus in the setting of PAF -->Eliquis.  . Osteoporosis   . PAF (paroxysmal atrial fibrillation) ([place])    a. diagnosed 05/10/2017; b. CHADS2VASc --> 3 (age x 2, female)--> Eliquis 2.5 (age/wt).   Past Surgical History:  Procedure Laterality Date  . CATARACT EXTRACTION W/PHACO Left 09/14/2015   Procedure: CATARACT EXTRACTION PHACO AND INTRAOCULAR LENS PLACEMENT (IOC);  Surgeon: Ronnell Freshwater, MD;  Location: [place];  Service: Ophthalmology;  Laterality: Left;  MALYUGIN  . EYE SURGERY Right    cataract surgery schedule for Easter Monday 2017;    - Nonsmoker, no EtOH, lives at [facility], daughter lives nearby, has 2 sons.   reports that she has never smoked. She has never used smokeless tobacco. She reports that she does not drink alcohol and does not use drugs. Allergies  Allergen Reactions  . Azithromycin Shortness Of Breath and Other (See Comments)    Syncope Can not stand up, feels very sick, weak Syncope Syncope Can not stand up, feels very sick, weak Syncope Syncope Can not stand up, feels very sick, weak Syncope Can not stand up, feels very sick, weak Syncope  . Clindamycin Other (See Comments)    GI problem  GI problem  GI problem   . Clindamycin/Lincomycin   . Hyoscyamine Diarrhea  . Lincomycin Other (See Comments)  . Maxitrol [Neomycin-Polymyxin-Dexameth]     Eye irritation  . Other Other (See Comments)    Eye irritation Eye irritation Eye irritation  . Timolol Other (See Comments)    Eye irritation Eye irritation Eye irritation Eye irritation  . Codeine Other (See Comments) and Nausea Only    Altered mental status FATIGUE, SICK FEELING Altered mental status Altered mental status FATIGUE, SICK FEELING  .  Penicillins Rash    Has patient had a PCN reaction causing immediate rash, facial/tongue/throat swelling, SOB or lightheadedness with hypotension: Yes Has patient had a PCN reaction causing severe rash involving mucus membranes or skin necrosis: No Has patient had a PCN reaction that required hospitalization: No Has patient had a PCN reaction occurring within the last 10 years: No If all of the above answers are "NO", then may proceed with Cephalosporin use.   Family History  Problem Relation Age of Onset  . CAD Mother   . Arrhythmia Mother   . Liver cancer Father    - Family history otherwise reviewed and not pertinent.  Prior to Admission medications   Medication Sig Start Date End Date Taking? Authorizing Provider  acetaminophen (TYLENOL) 325 MG tablet Take 650 mg by mouth every 4 (four) hours as needed for mild pain or moderate pain.    [provider]  amitriptyline (ELAVIL) 10 MG tablet Take 5 mg by mouth at bedtime.     [provider]  aspirin EC 81 MG tablet Take 81 mg by mouth daily. Swallow whole.    [provider]  atorvastatin (LIPITOR) 80 MG tablet Take 1 tablet (80 mg total) by mouth daily. 11/01/19 01/30/20  Barb Merino, MD  Cholecalciferol (VITAMIN D3) 50 MCG (2000 UT) TABS Take 2,000 Units by mouth at bedtime.     [provider]  dicyclomine (BENTYL) 10 MG capsule Take 10 mg by mouth every 6 (six) hours as needed (abdominal pain/cramping).  09/16/19   [provider]  LUMIGAN 0.01 % SOLN Place 1 drop into both eyes at bedtime.     [provider]  miconazole (MICOTIN) 2 % cream Apply 1 application topically 2 (two) times daily. (affected area on back)    [provider]  olmesartan (BENICAR) 5 MG tablet Take 5 mg by mouth at bedtime.     [provider]  PARoxetine (PAXIL) 20 MG tablet Take 20 mg by mouth daily.     [provider]  potassium chloride (KLOR-CON) 10 MEQ tablet Take 10 mEq by  mouth daily.    [provider]  Probiotic Product (ALIGN) 4 MG CAPS Take 4 mg by mouth daily.     [provider]    Physical Exam: Vitals:   03/15/20 1253 03/15/20 1500  BP: 124/88 (!) 149/97  Pulse: 95   Resp: (!) 25 20  Temp: 97.6 F (36.4 C)   TempSrc: Axillary   SpO2: 98%    Constitutional: Very elderly female in no distress, calm demeanor Eyes: Lids and conjunctivae normal, PERRL ENMT: Mucous membranes are dry. Posterior pharynx clear of any exudate or lesions. Fair dentition.  Neck: normal, supple, no masses, no thyromegaly Respiratory: Non-labored breathing room air without accessory muscle use. Clear breath sounds to auscultation bilaterally Cardiovascular: Regular with occasional premature beats, no murmurs, rubs, or gallops. No carotid bruits. No JVD. No LE edema. Palpable pedal pulses. Abdomen: Normoactive bowel sounds.  No tenderness, non-distended, and no masses palpated. No hepatosplenomegaly. GU: No indwelling catheter Musculoskeletal: No clubbing / cyanosis. Left hemiparesis and beginnings of contractures noted mostly in left hand. Decreased muscle bulk.  Skin: Warm, dry. No rashes, wounds, or ulcers. No significant lesions noted.  Neurologic: CN II-XII grossly intact. Has no strength in distal LUE, but can lift at the shoulder, no LLE strength, some sensation. Right side with intact strength and sensation.  Psychiatric: Alert and oriented x3 at time of my evaluation. Normal judgment and insight.   Labs on Admission: I have personally reviewed following labs and imaging studies  CBC: Recent Labs  Lab 03/15/20 1305  WBC 6.8  NEUTROABS 5.7  HGB 9.7*  HCT 29.4*  MCV 93.3  PLT 299   Basic Metabolic Panel: Recent Labs  Lab 03/15/20 1305  NA 141  K 3.4*  CL 103  CO2 25  GLUCOSE 137*  BUN 12  CREATININE 0.94  CALCIUM 9.5   GFR: CrCl cannot be calculated (Unknown ideal weight.). Liver Function Tests: Recent Labs  Lab 03/15/20 1305   AST 32  ALT 12  ALKPHOS 64  BILITOT 1.0  PROT 6.6  ALBUMIN 3.6   No results for input(s): LIPASE, AMYLASE in the last 168 hours. No results for input(s): AMMONIA in the last 168 hours. Coagulation Profile: No results for input(s): INR, PROTIME in the last 168 hours. Cardiac Enzymes: No results for input(s): CKTOTAL, CKMB, CKMBINDEX, TROPONINI in the last 168 hours. BNP (last 3 results) No results for input(s): PROBNP in the last 8760 hours. HbA1C: No results for input(s): HGBA1C in the last 72 hours. CBG: No results for input(s): GLUCAP in the last 168 hours. Lipid Profile: No results for input(s): CHOL, HDL, LDLCALC, TRIG, CHOLHDL, LDLDIRECT in the last 72 hours. Thyroid Function Tests: No results for input(s): TSH, T4TOTAL, FREET4, T3FREE, THYROIDAB in the last 72 hours. Anemia Panel: No results for input(s): VITAMINB12, FOLATE, FERRITIN, TIBC, IRON, RETICCTPCT in the last 72 hours. Urine analysis:    Component Value Date/Time   COLORURINE YELLOW (A) 03/15/2020 1305   APPEARANCEUR TURBID (A) 03/15/2020 1305   APPEARANCEUR Hazy 07/13/2013 0212   LABSPEC 1.015 03/15/2020 1305   LABSPEC 1.015 07/13/2013 0212   PHURINE 5.0 03/15/2020 1305   GLUCOSEU 50 (A) 03/15/2020 1305   GLUCOSEU 50 mg/dL 07/13/2013 0212   HGBUR NEGATIVE 03/15/2020 1305   BILIRUBINUR NEGATIVE 03/15/2020 1305   BILIRUBINUR Negative 07/13/2013 0212   KETONESUR NEGATIVE 03/15/2020 1305   PROTEINUR >=300 (A) 03/15/2020 1305   NITRITE NEGATIVE 03/15/2020 1305   LEUKOCYTESUR MODERATE (A) 03/15/2020 1305   LEUKOCYTESUR Negative 07/13/2013 0212    Recent Results (from the past 240 hour(s))  Respiratory Panel by RT PCR (Flu A&B, Covid) - Nasopharyngeal Swab     Status: None   Collection Time: 03/15/20  1:05 PM   Specimen: Nasopharyngeal Swab  Result Value Ref Range Status   SARS Coronavirus 2 by RT PCR NEGATIVE NEGATIVE Final    Comment: (NOTE) SARS-CoV-2 target nucleic acids are NOT DETECTED.  The  SARS-CoV-2 RNA is generally detectable in upper respiratoy specimens during the acute phase of infection. The lowest concentration of SARS-CoV-2 viral copies this assay can detect is 131 copies/mL. A negative result does not preclude SARS-Cov-2 infection and should not be used as the sole basis for treatment or other patient management decisions. A negative result may occur with  improper specimen collection/handling, submission of specimen other than nasopharyngeal swab, presence of viral mutation(s) within the  areas targeted by this assay, and inadequate number of viral copies (<131 copies/mL). A negative result must be combined with clinical observations, patient history, and epidemiological information. The expected result is Negative.  Fact Sheet for Patients:  PinkCheek.be  Fact Sheet for Healthcare Providers:  GravelBags.it  This test is no t yet approved or cleared by the [place] FDA and  has been authorized for detection and/or diagnosis of SARS-CoV-2 by FDA under an Emergency Use Authorization (EUA). This EUA will remain  in effect (meaning this test can be used) for the duration of the COVID-19 declaration under Section 564(b)(1) of the Act, 21 U.S.C. section 360bbb-3(b)(1), unless the authorization is terminated or revoked sooner.     Influenza A by PCR NEGATIVE NEGATIVE Final   Influenza B by PCR NEGATIVE NEGATIVE Final    Comment: (NOTE) The Xpert Xpress SARS-CoV-2/FLU/RSV assay is intended as an aid in  the diagnosis of influenza from Nasopharyngeal swab specimens and  should not be used as a sole basis for treatment. Nasal washings and  aspirates are unacceptable for Xpert Xpress SARS-CoV-2/FLU/RSV  testing.  Fact Sheet for Patients: PinkCheek.be  Fact Sheet for Healthcare Providers: GravelBags.it  This test is not yet approved or cleared  by the [place] FDA and  has been authorized for detection and/or diagnosis of SARS-CoV-2 by  FDA under an Emergency Use Authorization (EUA). This EUA will remain  in effect (meaning this test can be used) for the duration of the  Covid-19 declaration under Section 564(b)(1) of the Act, 21  U.S.C. section 360bbb-3(b)(1), unless the authorization is  terminated or revoked. Performed at [facility], [address] [place], [place] [address]      Radiological Exams on Admission: CT Head Wo Contrast  Result Date: 03/15/2020 CLINICAL DATA:  Mental status change EXAM: CT HEAD WITHOUT CONTRAST TECHNIQUE: Contiguous axial images were obtained from the base of the skull through the vertex without intravenous contrast. COMPARISON:  12/07/2019 head CT and prior.  10/29/2019 MRI head. FINDINGS: Brain: No intracranial hemorrhage. Focal left corona radiata hypodensity is new (2:20). Sequela of remote right frontal and right occipitotemporal insults. Mild cerebral atrophy with ex vacuo dilatation. Scattered and confluent hypodense foci involving the periventricular and subcortical white matter are nonspecific however commonly associated with chronic microvascular ischemic changes. No mass lesion. No midline shift, ventriculomegaly or extra-axial fluid collection. Vascular: No hyperdense vessel or unexpected calcification. Skull: No acute finding. Sinuses/Orbits: Normal orbits. Layering right maxillary and sphenoid secretions. No mastoid effusion. Other: None. IMPRESSION: Left corona radiata hypodensity concerning for acute/subacute insult. Remote right frontal and right occipitotemporal insults. Mild cerebral atrophy and advanced chronic microvascular ischemic changes. Electronically Signed   By: Primitivo Gauze M.D.   On: 03/15/2020 14:44   DG Chest Portable 1 View  Result Date: 03/15/2020 CLINICAL DATA:  Altered mental status EXAM: PORTABLE CHEST 1 VIEW COMPARISON:  December 07, 2019  FINDINGS: There is no edema or airspace opacity. Heart is mildly enlarged with pulmonary vascularity normal. No adenopathy. There is aortic atherosclerosis. No evident pneumothorax. No bone lesions. IMPRESSION: Stable cardiac prominence.  No edema or airspace opacity. Aortic Atherosclerosis (ICD10-I70.0). Electronically Signed   By: Lowella Grip III M.D.   On: 03/15/2020 14:02    EKG: Ordered  Assessment/Plan Active Problems:   Atherosclerosis of aorta (HCC)   HLD (hyperlipidemia)   Moderate episode of recurrent major depressive disorder (HCC)   Acute lower UTI   Acute CVA (cerebrovascular accident) ([place])   PAF (  paroxysmal atrial fibrillation) (HCC)   UTI: Urinalysis revealing few bacteria, >50 WBCs/HPF but evidence of nonclean catch and pt has no symptoms at this time. Lactic acid has cleared. - Continue ceftriaxone (has tolerated this despite PCN allergy thus far)  - Follow urine and blood cultures.   Acute/subacute corona radiata CVA, history of cerebrovascular disease and CVA w/left hemiparesis:  - Neurology, Dr. Irish Elders, consulted and requested the consult be repeated tomorrow morning. - MRI, carotid U/S, echo ordered.  - Give aspirin full dose. Got DAPT after stroke in May 2021 and appears to have been on ASA 81mg  at this time (med rec pending).  - Continue statin, check lipid panel and A1c.  AKI: Suspected AKI with Cr 0.94 w/such advanced age and sarcopenia as well as hyaline casts on UA.  - IV fluids to continue overnight, recheck in AM.  - Avoid nephrotoxins as able.   HLD: LDL was calculated to be 280mg /dl in May.  - Continue statin   PAF: Currently appears to be in sinus with frequent PVCs.  - Check ECG - Telemetry - Not on anticoagulation per report due to falls.  Depression:  - Continue SSRI, amitriptyline   HTN:  - Permissive HTN for now.  Immunized against covid-19 with [employer].  - Urged to get booster vaccine.  DVT prophylaxis: Heparin  Code  Status: DNR  Family Communication: Daughter at bedside Disposition Plan: Likely to return to SNF  Consults called: Neurology, Dr. Irish Elders and Palliative Care Admission status: Inpatient    Patrecia Pour, MD [facility] [email or web address] 03/15/2020, 5:53 PM

## 2020-03-15 NOTE — ED Notes (Signed)
Pt returned from MRI; repositioned in bed.

## 2020-03-15 NOTE — ED Notes (Signed)
Pt presents to ED via EMS from [facility] with c/o of AMS per staff. Staff states they got a reading of BP of "70 over palp", on arrival to ER pt's BP was stable. Pt is currently slightly confused. Pt is orientated to person but thinks it is "1948". Pt wears oxygen chronically. Heart rate stable. UA obtained, urine was foul smelling with large clumps of mucous. Daughter at bedside states pt was treated for a UTI 2 weeks ago "with 1 pill". Pt has a HX of stroke with deficits to L side. Pt denies falls or recently hitting her head, daughter agrees. Per daughter pt is normally A&Ox4 and out of bed by the time she gets there.

## 2020-03-16 ENCOUNTER — Inpatient Hospital Stay

## 2020-03-16 ENCOUNTER — Inpatient Hospital Stay
Admit: 2020-03-16 | Discharge: 2020-03-16 | Disposition: A | Discharge status: Home / Self Care | Attending: Family Medicine | Admitting: Family Medicine

## 2020-03-16 DIAGNOSIS — Z7189 Other specified counseling: Secondary | ICD-10-CM

## 2020-03-16 DIAGNOSIS — I639 Cerebral infarction, unspecified: Principal | ICD-10-CM

## 2020-03-16 DIAGNOSIS — Z515 Encounter for palliative care: Secondary | ICD-10-CM

## 2020-03-16 DIAGNOSIS — R4182 Altered mental status, unspecified: Secondary | ICD-10-CM

## 2020-03-16 LAB — ECHOCARDIOGRAM COMPLETE
AR max vel: 2.85 cm2
AV Area VTI: 3.05 cm2
AV Area mean vel: 2.94 cm2
AV Mean grad: 6 mmHg
AV Peak grad: 13.5 mmHg
Ao pk vel: 1.84 m/s
Area-P 1/2: 2.44 cm2
S' Lateral: 2.18 cm

## 2020-03-16 LAB — LIPID PANEL
Cholesterol: 234 mg/dL — ABNORMAL HIGH (ref 0–200)
HDL: 51 mg/dL (ref >40)
LDL Cholesterol: 168 mg/dL — ABNORMAL HIGH (ref 0–99)
Total CHOL/HDL Ratio: 4.6 RATIO
Triglycerides: 73 mg/dL (ref <150)
VLDL: 15 mg/dL (ref 0–40)

## 2020-03-16 LAB — BASIC METABOLIC PANEL
Anion gap: 12 (ref 5–15)
BUN: 13 mg/dL (ref 8–23)
CO2: 25 mmol/L (ref 22–32)
Calcium: 8.3 mg/dL — ABNORMAL LOW (ref 8.9–10.3)
Chloride: 109 mmol/L (ref 98–111)
Creatinine, Ser: 0.71 mg/dL (ref 0.44–1.00)
GFR, Estimated: >60 mL/min (ref >60)
Glucose, Bld: 81 mg/dL (ref 70–99)
Potassium: 2.5 mmol/L — CL (ref 3.5–5.1)
Sodium: 146 mmol/L — ABNORMAL HIGH (ref 135–145)

## 2020-03-16 LAB — MAGNESIUM: Magnesium: 1.7 mg/dL (ref 1.7–2.4)

## 2020-03-16 MED ORDER — ROSUVASTATIN CALCIUM 20 MG PO TABS
20.0000 mg | ORAL_TABLET | Freq: Every day | ORAL | Status: DC
  Administered 2020-03-17: 08:00:00 20 mg via ORAL
  Filled 2020-03-16 (×2): qty 1

## 2020-03-16 MED ORDER — MAGNESIUM SULFATE 2 GM/50ML IV SOLN
2.0000 g | Freq: Once | INTRAVENOUS | Status: AC
  Administered 2020-03-16: 2 g via INTRAVENOUS
  Filled 2020-03-16: qty 50

## 2020-03-16 MED ORDER — CLOPIDOGREL BISULFATE 75 MG PO TABS
75.0000 mg | ORAL_TABLET | Freq: Every day | ORAL | Status: DC
  Administered 2020-03-16 – 2020-03-18 (×3): 75 mg via ORAL
  Filled 2020-03-16 (×3): qty 1

## 2020-03-16 MED ORDER — MIRTAZAPINE 15 MG PO TABS
7.5000 mg | ORAL_TABLET | Freq: Every day | ORAL | Status: DC
  Administered 2020-03-16 – 2020-03-17 (×2): 7.5 mg via ORAL
  Filled 2020-03-16 (×2): qty 1

## 2020-03-16 MED ORDER — ASPIRIN EC 81 MG PO TBEC
81.0000 mg | DELAYED_RELEASE_TABLET | Freq: Every day | ORAL | Status: DC
  Administered 2020-03-16 – 2020-03-18 (×3): 81 mg via ORAL
  Filled 2020-03-16 (×3): qty 1

## 2020-03-16 MED ORDER — SODIUM CHLORIDE 0.45 % IV SOLN
INTRAVENOUS | Status: DC
  Filled 2020-03-16 (×5): qty 1000

## 2020-03-16 MED ORDER — BISACODYL 10 MG RE SUPP
10.0000 mg | RECTAL | Status: DC | PRN
  Filled 2020-03-16: qty 1

## 2020-03-16 MED ORDER — POTASSIUM CHLORIDE 10 MEQ/100ML IV SOLN: 10.0000 meq | INTRAVENOUS | Status: DC

## 2020-03-16 MED ORDER — SODIUM CHLORIDE 0.9 % IV SOLN: Freq: Once | INTRAVENOUS | Status: AC

## 2020-03-16 MED ORDER — POTASSIUM CHLORIDE 10 MEQ/100ML IV SOLN
10.0000 meq | INTRAVENOUS | Status: AC
  Administered 2020-03-16 (×5): 10 meq via INTRAVENOUS
  Filled 2020-03-16 (×4): qty 100

## 2020-03-16 MED ORDER — MORPHINE SULFATE (CONCENTRATE) 10 MG/0.5ML PO SOLN
5.0000 mg | ORAL | Status: DC | PRN
  Filled 2020-03-16: qty 0.5

## 2020-03-16 MED ORDER — ONDANSETRON HCL 4 MG PO TABS: 4.0000 mg | ORAL_TABLET | Freq: Three times a day (TID) | ORAL | Status: DC | PRN

## 2020-03-16 NOTE — Progress Notes (Signed)
SLP Cancellation Note  Patient Details Name: Christy Waters MRN: [id number] DOB: 16-Aug-1918   Cancelled treatment:       Reason Eval/Treat Not Completed: Other (comment)  Pt currently sleeping and NPO d/t Stroke Order Set. Per chart and conversation with pt's nurse, [name] has not be completed to allow pt to continue sleeping. Nursing to re-consult if pt's fails [employer] Screen.   Per chart, pt has been alert and oriented x 3 and able to communicate her wants/needs. She is currently being treated for UTI. If any acute deficits are identified after abx treatment, please re-consult ST.   Aletha Allebach B. Rutherford Nail M.S., CCC-SLP, [place] Office [phone]   Stormy Fabian 03/16/2020, 9:38 AM

## 2020-03-16 NOTE — ED Notes (Signed)
Patient denies pain and is resting comfortably. Denies nausea. Pt has food tray.

## 2020-03-16 NOTE — ED Notes (Signed)
Pt switched from stretcher to [facility] in-patient bed. Remains on purewick to low suction. Repositioned. Daughter at bedside. Bed locked low. Rails up. Call bell within reach.

## 2020-03-16 NOTE — Progress Notes (Signed)
*  PRELIMINARY RESULTS* Echocardiogram 2D Echocardiogram has been performed.  Christy Waters 03/16/2020, 10:43 AM

## 2020-03-16 NOTE — Progress Notes (Signed)
[facility] Liaison note:  Patient is currently followed by [facility] hospice services at [facility]. She has a hospice diagnosis of CVA. She is a DNR code. Patient was sent to the [facility] ED at family request yesterday 10/17 for evaluation of altered mental status. Hospice was not notified prior to transport. This is a related admission per hospice physician Dr. Gilford Rile. MRI in the ED revealed late subacute white matter infarct in the left corona radiata/centrum semiovale. Neurology was consulted. Urinalysis was also positive for a UTI. IV antibiotics have been started.  Family was in agreement with stroke work up and carotid doppler was ordered and performed.   Visited patient in the ED, she appeared to be resting comfortably. Easily awakened by voice. Daughter Jan at bedside. Discussed current plan of care with Jan.  Jan expressed her concern regarding her mother being dehydrated and having a urinary tract infection.  She said her mother doesn't like the thicken liquids that are ordered for her at [facility] and that she is "always very thirsty" when she visits. Patient is bed bound at baseline and needs assistance with feeding.  Jan or her sister will remain at bedside and are in agreement with current plan of admission and treatment. Will continue to follow and update hospice team and provide support to patient  and family.  VS: 131/99, 72, 20, 98% on RA  I/O: + 833.7  Abnormal Labs: Sodium: 146 (H) Potassium: 2.5 (LL) Calcium: 8.3 (L) Cholesterol: 234 (H) LDL (calc): 168 (H)  Urinalysis  03/15/2020 13:05 Appearance: TURBID (A) Bilirubin Urine: NEGATIVE Color, Urine: YELLOW (A) Glucose, UA: 50 (A) Leukocytes,Ua: MODERATE (A) Protein: >=300 (A) Bacteria, UA: FEW (A) Hyaline Casts, UA: PRESENT Mucus: PRESENT Non Squamous Epithelial: PRESENT (A) WBC, UA: >50 (H)  Diagnostics: CLINICAL DATA:  Mental status change EXAM: CT HEAD WITHOUT  CONTRAST IMPRESSION: Left corona radiata hypodensity concerning for acute/subacute insult.  Remote right frontal and right occipitotemporal insults.  Mild cerebral atrophy and advanced chronic microvascular ischemic Changes.  CLINICAL DATA:  86 year old female with altered mental status. History of prior stroke.  EXAM:MRI HEAD WITHOUT CONTRAST  IMPRESSION: 1. Late subacute white matter infarct in the left corona radiata/centrum semiovale. No associated hemorrhage or mass effect. 2. Elsewhere stable ischemic disease since the June MRI. No acute intracranial abnormality.  IV/PRN meds: IVF Na 0.45% w/K @75  ml/hr, IV Rocephin 1 gm Q 24 hrs, Liquid morphine concentrate 5 mg SL q 2 hrs PRN  Hospital Problem list Active Problems:   Atherosclerosis of aorta (HCC)   HLD (hyperlipidemia)   Moderate episode of recurrent major depressive disorder (HCC)   Acute lower UTI   Acute CVA (cerebrovascular accident) ([place])   PAF (paroxysmal atrial fibrillation) ([place])  UTI: Urinalysis revealing few bacteria, >50 WBCs/HPF but evidence of nonclean catch. Lactic acid has cleared. - Continue ceftriaxone (has tolerated this despite PCN allergy thus far)  - Follow urine and blood cultures. Urine culture wasn't sent, so add-on requested.  Late subacute corona radiata infarct, history of cerebrovascular disease and CVA w/left hemiparesis:  - Neurology consult appreciated. Will order MRA brain w/o contrast and follow up results of echocardiogram. Carotid atherosclerosis noted with stenosis <50%.  - LDL remains elevated at 168. No statin intolerance noted, will change atorvastatin to rosuvastatin.  - DAPT ordered per neurology. With AFib, stroke risk reduction would be greatest with anticoagulation though this appears to have been discussed and not continued based on goals of care.  AKI: Suspected  AKI with Cr 0.94 w/such advanced age and sarcopenia as well as hyaline casts on UA. SCr improved with  IVF.  - Developed hypernatremia as well overnight, so IVF changed to 1/2NS w/2mEq KCl @ 75 cc/hr.  - Avoid nephrotoxins as able.   Hypokalemia: Worsened despite supplementation. Has not taken po overnight.  - Push po, ok to give diet per SLP.  - Supplement in IV fluids, give empiric magnesium and recheck metabolic panel w/Mg in AM.   HLD: LDL was calculated to be 280mg /dl in May. Remains very high  - Continue statin > augment to rosuvastatin. PAF: Currently appears to be in sinus with frequent PVCs.  - Telemetry - Not on anticoagulation per report due to falls. Depression:  - Continue SSRI, amitriptyline  HTN:  - Permissive HTN for now. Immunized against covid-19 with [employer].  - Urged to get booster vaccine.  DVT prophylaxis: Heparin  Code Status: DNR   IDG: Updated Discharge Planning: on going, expect return to [facility]. Goals of Care: Ongoing, patient is a DNR. Hospital Palliative was consulted by admitting MD Transfer Summary and current Medication list placed on shadow chart in the ED.  Please call with any hospice related questions.  Flo Shanks BSN, RN, [place] [phone]

## 2020-03-16 NOTE — Progress Notes (Signed)
PROGRESS NOTE  Christy Waters  NAT:[id number] DOB: 09/05/1918 DOA: 03/15/2020 PCP: Rusty Aus, MD   Brief Narrative: Christy Waters is a 86 y.o. female with a history of right ACA, MCA CVA with residual left hemiparesis, HLD, PAF no longer on eliquis who presented from [facility] 10/17 for confusion and question of gaze deviation as well as reported soft blood pressures. CT demonstrated acute vs. subacute CVA in the corona radiata. Urinalysis suggested UTI, WBC 6.8k though lactic acid was 3.6 for which ceftriaxone and 1L LR were given and cultures drawn. Hospitalists called to admit for UTI and CVA. She receives palliative care services per her daughter and is DNR but does not wish for restrictions in care at this time. MRI revealed area in question to be late subacute-to-chronic small vessel infarction in addition to remote right frontal and right occipitotemporal strokes, mild cerebral atrophy, and advanced chronic microvascular ischemic changes.  Neurology was consulted, recommending DAPT, MRA brain in addition to work up underway.   Assessment & Plan: Active Problems:   Atherosclerosis of aorta (HCC)   HLD (hyperlipidemia)   Moderate episode of recurrent major depressive disorder (HCC)   Acute lower UTI   Acute CVA (cerebrovascular accident) ([place])   PAF (paroxysmal atrial fibrillation) ([place])  UTI: Urinalysis revealing few bacteria, >50 WBCs/HPF but evidence of nonclean catch. Lactic acid has cleared. - Continue ceftriaxone (has tolerated this despite PCN allergy thus far)  - Follow urine and blood cultures. Urine culture wasn't sent, so add-on requested.  Late subacute corona radiata infarct, history of cerebrovascular disease and CVA w/left hemiparesis:  - Neurology consult appreciated. Will order MRA brain w/o contrast and follow up results of echocardiogram. Carotid atherosclerosis noted with stenosis <50%.  - LDL remains elevated at 168. No statin intolerance noted,  will change atorvastatin to rosuvastatin.  - DAPT ordered per neurology. With AFib, stroke risk reduction would be greatest with anticoagulation though this appears to have been discussed and not continued based on goals of care.  AKI: Suspected AKI with Cr 0.94 w/such advanced age and sarcopenia as well as hyaline casts on UA. SCr improved with IVF.  - Developed hypernatremia as well overnight, so IVF changed to 1/2NS w/96mEq KCl @ 75 cc/hr.  - Avoid nephrotoxins as able.   Hypokalemia: Worsened despite supplementation. Has not taken po overnight.  - Push po, ok to give diet per SLP.  - Supplement in IV fluids, give empiric magnesium and recheck metabolic panel w/Mg in AM.   HLD: LDL was calculated to be 280mg /dl in May. Remains very high  - Continue statin > augment to rosuvastatin.  PAF: Currently appears to be in sinus with frequent PVCs.  - Telemetry - Not on anticoagulation per report due to falls.  Depression:  - Continue SSRI, amitriptyline   HTN:  - Permissive HTN for now.  Immunized against covid-19 with [employer].  - Urged to get booster vaccine.  DVT prophylaxis: Heparin  Code Status: DNR Family Communication: Daughter by phone. Disposition Plan:  Status is: Inpatient  Remains inpatient appropriate because:Altered mental status and Ongoing diagnostic testing needed not appropriate for outpatient work up  Dispo: The patient is from: SNF              Anticipated d/c is to: SNF              Anticipated d/c date is: 1 day              Patient currently is  not medically stable to d/c.  Consultants:   Neurology  Procedures:   None  Antimicrobials:  Ceftriaxone   Subjective: Pt remembers me from yesterday, slept well, is thirsty. No new weakness or numbness.   Objective: Vitals:   03/16/20 1337 03/16/20 1400 03/16/20 1445 03/16/20 1500  BP: (!) 154/94 (!) 177/95  (!) 156/84  Pulse: 75 79 68 78  Resp: 16 18 20  (!) 21  Temp:      TempSrc:       SpO2: 99% 99% 98% 100%   No intake or output data in the 24 hours ending 03/16/20 1532 There were no vitals filed for this visit.  Gen: Elderly female in no distress Pulm: Non-labored breathing. Clear to auscultation bilaterally.  CV: Regular rate and rhythm. No murmur, rub, or gallop. No JVD, no pitting pedal edema. GI: Abdomen soft, non-tender, non-distended, with normoactive bowel sounds. No organomegaly or masses felt. Ext: Warm, no deformities Skin: No rashes, lesions or ulcers on visualized skin Neuro: Drowsy and rousable, left spastic hemiparesis stable. No new focal neurological deficits. Psych: Judgement and insight appear fair. Mood & affect appropriate.   Data Reviewed: I have personally reviewed following labs and imaging studies  CBC: Recent Labs  Lab 03/15/20 1305  WBC 6.8  NEUTROABS 5.7  HGB 9.7*  HCT 29.4*  MCV 93.3  PLT 295   Basic Metabolic Panel: Recent Labs  Lab 03/15/20 1305 03/16/20 0526 03/16/20 1324  NA 141 146*  --   K 3.4* 2.5*  --   CL 103 109  --   CO2 25 25  --   GLUCOSE 137* 81  --   BUN 12 13  --   CREATININE 0.94 0.71  --   CALCIUM 9.5 8.3*  --   MG  --   --  1.7   GFR: CrCl cannot be calculated (Unknown ideal weight.). Liver Function Tests: Recent Labs  Lab 03/15/20 1305  AST 32  ALT 12  ALKPHOS 64  BILITOT 1.0  PROT 6.6  ALBUMIN 3.6   No results for input(s): LIPASE, AMYLASE in the last 168 hours. No results for input(s): AMMONIA in the last 168 hours. Coagulation Profile: No results for input(s): INR, PROTIME in the last 168 hours. Cardiac Enzymes: No results for input(s): CKTOTAL, CKMB, CKMBINDEX, TROPONINI in the last 168 hours. BNP (last 3 results) No results for input(s): PROBNP in the last 8760 hours. HbA1C: No results for input(s): HGBA1C in the last 72 hours. CBG: No results for input(s): GLUCAP in the last 168 hours. Lipid Profile: Recent Labs    03/16/20 0526  CHOL 234*  HDL 51  LDLCALC 168*  TRIG  73  CHOLHDL 4.6   Thyroid Function Tests: No results for input(s): TSH, T4TOTAL, FREET4, T3FREE, THYROIDAB in the last 72 hours. Anemia Panel: No results for input(s): VITAMINB12, FOLATE, FERRITIN, TIBC, IRON, RETICCTPCT in the last 72 hours. Urine analysis:    Component Value Date/Time   COLORURINE YELLOW (A) 03/15/2020 1305   APPEARANCEUR TURBID (A) 03/15/2020 1305   APPEARANCEUR Hazy 07/13/2013 0212   LABSPEC 1.015 03/15/2020 1305   LABSPEC 1.015 07/13/2013 0212   PHURINE 5.0 03/15/2020 1305   GLUCOSEU 50 (A) 03/15/2020 1305   GLUCOSEU 50 mg/dL 07/13/2013 0212   HGBUR NEGATIVE 03/15/2020 1305   BILIRUBINUR NEGATIVE 03/15/2020 1305   BILIRUBINUR Negative 07/13/2013 0212   KETONESUR NEGATIVE 03/15/2020 1305   PROTEINUR >=300 (A) 03/15/2020 1305   NITRITE NEGATIVE 03/15/2020 1305   LEUKOCYTESUR MODERATE (A)  03/15/2020 1305   LEUKOCYTESUR Negative 07/13/2013 0212   Recent Results (from the past 240 hour(s))  Respiratory Panel by RT PCR (Flu A&B, Covid) - Nasopharyngeal Swab     Status: None   Collection Time: 03/15/20  1:05 PM   Specimen: Nasopharyngeal Swab  Result Value Ref Range Status   SARS Coronavirus 2 by RT PCR NEGATIVE NEGATIVE Final    Comment: (NOTE) SARS-CoV-2 target nucleic acids are NOT DETECTED.  The SARS-CoV-2 RNA is generally detectable in upper respiratoy specimens during the acute phase of infection. The lowest concentration of SARS-CoV-2 viral copies this assay can detect is 131 copies/mL. A negative result does not preclude SARS-Cov-2 infection and should not be used as the sole basis for treatment or other patient management decisions. A negative result may occur with  improper specimen collection/handling, submission of specimen other than nasopharyngeal swab, presence of viral mutation(s) within the areas targeted by this assay, and inadequate number of viral copies (<131 copies/mL). A negative result must be combined with clinical observations,  patient history, and epidemiological information. The expected result is Negative.  Fact Sheet for Patients:  PinkCheek.be  Fact Sheet for Healthcare Providers:  GravelBags.it  This test is no t yet approved or cleared by the [place] FDA and  has been authorized for detection and/or diagnosis of SARS-CoV-2 by FDA under an Emergency Use Authorization (EUA). This EUA will remain  in effect (meaning this test can be used) for the duration of the COVID-19 declaration under Section 564(b)(1) of the Act, 21 U.S.C. section 360bbb-3(b)(1), unless the authorization is terminated or revoked sooner.     Influenza A by PCR NEGATIVE NEGATIVE Final   Influenza B by PCR NEGATIVE NEGATIVE Final    Comment: (NOTE) The Xpert Xpress SARS-CoV-2/FLU/RSV assay is intended as an aid in  the diagnosis of influenza from Nasopharyngeal swab specimens and  should not be used as a sole basis for treatment. Nasal washings and  aspirates are unacceptable for Xpert Xpress SARS-CoV-2/FLU/RSV  testing.  Fact Sheet for Patients: PinkCheek.be  Fact Sheet for Healthcare Providers: GravelBags.it  This test is not yet approved or cleared by the [place] FDA and  has been authorized for detection and/or diagnosis of SARS-CoV-2 by  FDA under an Emergency Use Authorization (EUA). This EUA will remain  in effect (meaning this test can be used) for the duration of the  Covid-19 declaration under Section 564(b)(1) of the Act, 21  U.S.C. section 360bbb-3(b)(1), unless the authorization is  terminated or revoked. Performed at [facility], [address] [place], [place] [address]   Culture, blood (routine x 2)     Status: None (Preliminary result)   Collection Time: 03/15/20  1:07 PM   Specimen: BLOOD  Result Value Ref Range Status   Specimen Description BLOOD RIGHT ARM  Final    Special Requests   Final    BOTTLES DRAWN AEROBIC AND ANAEROBIC Blood Culture adequate volume   Culture   Final    NO GROWTH < 24 HOURS Performed at [facility], [address], [place], [place] [address]    Report Status PENDING  Incomplete  Culture, blood (routine x 2)     Status: None (Preliminary result)   Collection Time: 03/15/20  2:34 PM   Specimen: BLOOD  Result Value Ref Range Status   Specimen Description BLOOD BLOOD RIGHT HAND  Final   Special Requests   Final    BOTTLES DRAWN AEROBIC AND ANAEROBIC Blood Culture results  may not be optimal due to an inadequate volume of blood received in culture bottles   Culture   Final    NO GROWTH < 24 HOURS Performed at [facility], [address] [place], [place] [address]    Report Status PENDING  Incomplete      Radiology Studies: CT Head Wo Contrast  Result Date: 03/15/2020 CLINICAL DATA:  Mental status change EXAM: CT HEAD WITHOUT CONTRAST TECHNIQUE: Contiguous axial images were obtained from the base of the skull through the vertex without intravenous contrast. COMPARISON:  12/07/2019 head CT and prior.  10/29/2019 MRI head. FINDINGS: Brain: No intracranial hemorrhage. Focal left corona radiata hypodensity is new (2:20). Sequela of remote right frontal and right occipitotemporal insults. Mild cerebral atrophy with ex vacuo dilatation. Scattered and confluent hypodense foci involving the periventricular and subcortical white matter are nonspecific however commonly associated with chronic microvascular ischemic changes. No mass lesion. No midline shift, ventriculomegaly or extra-axial fluid collection. Vascular: No hyperdense vessel or unexpected calcification. Skull: No acute finding. Sinuses/Orbits: Normal orbits. Layering right maxillary and sphenoid secretions. No mastoid effusion. Other: None. IMPRESSION: Left corona radiata hypodensity concerning for acute/subacute insult. Remote right frontal and right  occipitotemporal insults. Mild cerebral atrophy and advanced chronic microvascular ischemic changes. Electronically Signed   By: Primitivo Gauze M.D.   On: 03/15/2020 14:44   MR BRAIN WO CONTRAST  Result Date: 03/15/2020 CLINICAL DATA:  86 year old female with altered mental status. History of prior stroke. EXAM: MRI HEAD WITHOUT CONTRAST TECHNIQUE: Multiplanar, multiecho pulse sequences of the brain and surrounding structures were obtained without intravenous contrast. COMPARISON:  Head CT earlier today.  Brain MRI 10/29/2019. FINDINGS: Brain: Subtle increased trace diffusion signal in the left corona radiata and centrum semiovale where new confluent white matter T2 and FLAIR hyperintensity has developed since June (series 12, image 18). However, this is not clearly restricted on ADC. No associated hemorrhage or mass effect at that site. No diffusion restriction elsewhere. Stable multifocal cortical and subcortical encephalomalacia including in the superior right MCA and right MCA/PCA watershed areas. Underlying confluent bilateral cerebral white matter T2 and FLAIR hyperintensity is stable outside of the left superior frontal lobe. Chronic microhemorrhage in the right periatrial white matter is stable. A microhemorrhage is visible in the midline dorsal pons today, that area was obscured previously on SWI. Patchy T2 heterogeneity in the pons is stable. Interestingly, the deep gray nuclei and cerebellum remain within normal limits. No midline shift, mass effect, evidence of mass lesion, ventriculomegaly, extra-axial collection or acute intracranial hemorrhage. Cervicomedullary junction and pituitary are within normal limits. Vascular: Major intracranial vascular flow voids appear stable since June. Skull and upper cervical spine: Stable visible cervical spine, chronic disc disease at C2-C3. Normal bone marrow signal. Sinuses/Orbits: Stable and negative orbits. Chronic right sphenoid and maxillary sinus  mucosal thickening. Other: Mastoids remain clear. Grossly normal visible internal auditory structures. Scalp and face appear negative. IMPRESSION: 1. Late subacute white matter infarct in the left corona radiata/centrum semiovale. No associated hemorrhage or mass effect. 2. Elsewhere stable ischemic disease since the June MRI. No acute intracranial abnormality. Electronically Signed   By: Genevie Ann M.D.   On: 03/15/2020 21:30   US Carotid Bilateral (at [facility] and [facility] only)  Result Date: 03/16/2020 CLINICAL DATA:  Hypertension, stroke, hyperlipidemia EXAM: BILATERAL CAROTID DUPLEX ULTRASOUND TECHNIQUE: Pearline Cables scale imaging, color Doppler and duplex ultrasound were performed of bilateral carotid and vertebral arteries in the neck. COMPARISON:  None. FINDINGS: Criteria: Quantification of carotid  stenosis is based on velocity parameters that correlate the residual internal carotid diameter with NASCET-based stenosis levels, using the diameter of the distal internal carotid lumen as the denominator for stenosis measurement. The following velocity measurements were obtained: RIGHT ICA: 64/23 cm/sec CCA: 43/32 cm/sec SYSTOLIC ICA/CCA RATIO:  1.6 ECA: 88 cm/sec LEFT ICA: 59/26 cm/sec CCA: 95/18 cm/sec SYSTOLIC ICA/CCA RATIO:  1.4 ECA: 60 cm/sec RIGHT CAROTID ARTERY: Mild to moderate echogenic shadowing plaque formation. No hemodynamically significant right ICA stenosis, velocity elevation, or turbulent flow. Degree of narrowing less than 50%. RIGHT VERTEBRAL ARTERY:  Normal antegrade flow LEFT CAROTID ARTERY: Similar scattered mild echogenic plaque formation. No hemodynamically significant left ICA stenosis, velocity elevation, or turbulent flow. LEFT VERTEBRAL ARTERY:  Normal antegrade flow IMPRESSION: Bilateral carotid atherosclerosis. No hemodynamically significant ICA stenosis. Degree of narrowing less than 50% bilaterally by ultrasound criteria. Patent antegrade vertebral flow bilaterally Electronically Signed   By: Jerilynn Mages.   Shick M.D.   On: 03/16/2020 08:52   DG Chest Portable 1 View  Result Date: 03/15/2020 CLINICAL DATA:  Altered mental status EXAM: PORTABLE CHEST 1 VIEW COMPARISON:  December 07, 2019 FINDINGS: There is no edema or airspace opacity. Heart is mildly enlarged with pulmonary vascularity normal. No adenopathy. There is aortic atherosclerosis. No evident pneumothorax. No bone lesions. IMPRESSION: Stable cardiac prominence.  No edema or airspace opacity. Aortic Atherosclerosis (ICD10-I70.0). Electronically Signed   By: Lowella Grip III M.D.   On: 03/15/2020 14:02   ECHOCARDIOGRAM COMPLETE  Result Date: 03/16/2020    ECHOCARDIOGRAM REPORT   Patient Name:   [facility] [place] Date of Exam: 03/16/2020 Medical Rec #:  [id number]               Height:       66.0 in Accession #:    [id number]              Weight:       119.0 lb Date of Birth:  1918/06/13               BSA:          1.604 m Patient Age:    51 years                BP:           153/110 mmHg Patient Gender: F                       HR:           70 bpm. Exam Location:  [facility] Procedure: 2D Echo, Color Doppler and Cardiac Doppler Indications:     I163.9 Stroke  History:         Patient has prior history of Echocardiogram examinations, most                  recent 10/29/2019. Arrythmias:Atrial Fibrillation.  Sonographer:     Charmayne Sheer RDCS (AE) Referring Phys:  [place] Diagnosing Phys: Isaias Cowman MD  Sonographer Comments: Suboptimal parasternal window. IMPRESSIONS  1. Left ventricular ejection fraction, by estimation, is 60 to 65%. The left ventricle has normal function. The left ventricle has no regional wall motion abnormalities. Left ventricular diastolic parameters are consistent with Grade I diastolic dysfunction (impaired relaxation).  2. Right ventricular systolic function is normal. The right ventricular size is normal.  3. The mitral valve is normal in structure. Mild to moderate mitral valve regurgitation. No evidence of mitral  stenosis.  4. Tricuspid valve regurgitation is moderate.  5. The aortic valve is normal in structure. Aortic valve regurgitation is mild. No aortic stenosis is present.  6. The inferior vena cava is normal in size with greater than 50% respiratory variability, suggesting right atrial pressure of 3 mmHg. FINDINGS  Left Ventricle: Left ventricular ejection fraction, by estimation, is 60 to 65%. The left ventricle has normal function. The left ventricle has no regional wall motion abnormalities. The left ventricular internal cavity size was normal in size. There is  no left ventricular hypertrophy. Left ventricular diastolic parameters are consistent with Grade I diastolic dysfunction (impaired relaxation). Right Ventricle: The right ventricular size is normal. No increase in right ventricular wall thickness. Right ventricular systolic function is normal. Left Atrium: Left atrial size was normal in size. Right Atrium: Right atrial size was normal in size. Pericardium: There is no evidence of pericardial effusion. Mitral Valve: The mitral valve is normal in structure. Mild to moderate mitral valve regurgitation. No evidence of mitral valve stenosis. MV peak gradient, 10.5 mmHg. The mean mitral valve gradient is 4.0 mmHg. Tricuspid Valve: The tricuspid valve is normal in structure. Tricuspid valve regurgitation is moderate . No evidence of tricuspid stenosis. Aortic Valve: The aortic valve is normal in structure. Aortic valve regurgitation is mild. No aortic stenosis is present. Aortic valve mean gradient measures 6.0 mmHg. Aortic valve peak gradient measures 13.5 mmHg. Aortic valve area, by VTI measures 3.05  cm. Pulmonic Valve: The pulmonic valve was normal in structure. Pulmonic valve regurgitation is not visualized. No evidence of pulmonic stenosis. Aorta: The aortic root is normal in size and structure. Venous: The inferior vena cava is normal in size with greater than 50% respiratory variability, suggesting right  atrial pressure of 3 mmHg. IAS/Shunts: No atrial level shunt detected by color flow Doppler.  LEFT VENTRICLE PLAX 2D LVIDd:         3.52 cm  Diastology LVIDs:         2.18 cm  LV e' medial:    4.46 cm/s LV PW:         1.06 cm  LV E/e' medial:  29.4 LV IVS:        0.79 cm  LV e' lateral:   4.35 cm/s LVOT diam:     2.30 cm  LV E/e' lateral: 30.1 LV SV:         104 LV SV Index:   65 LVOT Area:     4.15 cm  RIGHT VENTRICLE RV Basal diam:  3.13 cm LEFT ATRIUM             Index       RIGHT ATRIUM           Index LA diam:        3.00 cm 1.87 cm/m  RA Area:     19.60 cm LA Vol (A2C):   61.8 ml 38.53 ml/m RA Volume:   65.20 ml  40.65 ml/m LA Vol (A4C):   42.8 ml 26.68 ml/m LA Biplane Vol: 51.8 ml 32.29 ml/m  AORTIC VALVE                    PULMONIC VALVE AV Area (Vmax):    2.85 cm     PV Vmax:       1.49 m/s AV Area (Vmean):   2.94 cm     PV Vmean:      91.200 cm/s AV Area (VTI):     3.05 cm  PV VTI:        0.284 m AV Vmax:           184.00 cm/s  PV Peak grad:  8.9 mmHg AV Vmean:          116.000 cm/s PV Mean grad:  4.0 mmHg AV VTI:            0.342 m AV Peak Grad:      13.5 mmHg AV Mean Grad:      6.0 mmHg LVOT Vmax:         126.00 cm/s LVOT Vmean:        82.100 cm/s LVOT VTI:          0.251 m LVOT/AV VTI ratio: 0.73  AORTA Ao Root diam: 3.30 cm MITRAL VALVE                TRICUSPID VALVE MV Area (PHT): 2.44 cm     TR Peak grad:   38.2 mmHg MV Peak grad:  10.5 mmHg    TR Vmax:        309.00 cm/s MV Mean grad:  4.0 mmHg MV Vmax:       1.62 m/s     SHUNTS MV Vmean:      99.8 cm/s    Systemic VTI:  0.25 m MV Decel Time: 311 msec     Systemic Diam: 2.30 cm MV E velocity: 131.00 cm/s MV A velocity: 158.00 cm/s MV E/A ratio:  0.83 Isaias Cowman MD Electronically signed by Isaias Cowman MD Signature Date/Time: 03/16/2020/1:40:19 PM    Final     Scheduled Meds: .  stroke: mapping our early stages of recovery book   Does not apply Once  . aspirin  324 mg Oral Once  . clopidogrel  75 mg Oral Daily  .  heparin  5,000 Units Subcutaneous Q8H  . latanoprost  1 drop Both Eyes QHS  . mirtazapine  7.5 mg Oral QHS  . PARoxetine  20 mg Oral Daily  . rosuvastatin  20 mg Oral Daily   Continuous Infusions: . cefTRIAXone (ROCEPHIN)  IV    . magnesium sulfate bolus IVPB 2 g (03/16/20 1450)  . sodium chloride 0.45 % with kcl Stopped (03/16/20 1438)     LOS: 1 day   Time spent: 35 minutes.  Patrecia Pour, MD [facility] [email or web address] 03/16/2020, 3:32 PM

## 2020-03-16 NOTE — Progress Notes (Signed)
PT Cancellation Note  Patient Details Name: Christy Waters MRN: [id number] DOB: Jul 03, 1918   Cancelled Treatment:    Reason Eval/Treat Not Completed: Other (comment) Pt order received and chart reviewed. Pt's potassium level at 2.5 this morning which is contraindicated for physical exertion. Will continue to follow and attempt evaluation when appropriate.   [place] 03/16/2020, 10:51 AM

## 2020-03-16 NOTE — Consult Note (Signed)
Referring Physician: Dr. Bonner Puna    Chief Complaint: Acute stroke  HPI: Christy Waters is an 86 y.o. female with a PMHx of HTN, HLD, stroke with residual left sided deficits and chronic atrial fibrillation who presented to the [facility] ED on Sunday from her SNF with AMS. She was found confused by her daughter, relative to her baseline of being alert and oriented x 4. Initially her eyes were open and she was staring straight ahead. The then lost consciousness, per daughter. Chart documents that she had a syncopal episode and then seemed to be confused. Her gaze was "locked" per EMS when they first arrived; she then became more alert and conversant when moved to her stretcher. On arrival to the ED she was oriented to person only and was complaining of pain. Per report, her BP was low at the SNF. It had normalized with EMS without intervention.  CT head revealed a left corona radiata hypodensity initially concerning for an acute/subacute insult, which was revealed to be a late-subacute to chronic small vessel ischemic white matter infarction on subsequent MRI performed Sunday evening. Also noted on imaging were remote right frontal and right occipitotemporal strokes, mild cerebral atrophy and advanced chronic microvascular ischemic changes.   Home medications include Plavix.   Her prior stroke occurred in May and she was admitted at that time at [facility]. Her daughter states that since then she is mostly bedbound, but is occasionally transferred to a chair at her SNF. She has had about 10 lbs weight loss since her stroke.  Past Medical History:  Diagnosis Date  . Anemia    distant past  . Arthritis    fingers, hips  . Cancer ([place])    skin cancer; some spots on left side of face; been 5 years since last treated;   . Headache   . History of echocardiogram    a. TTE 04/06/17: EF > 55%, no RWMA, no LA mass, severe TR, RV systolic dysfunction, elevated PASP; b. TTE 04/2017: EF 65-70%, no RWMA, Gr1DD,  calcified mitral annulus w/ mild MR, mild to mod dilated LA w/ irregular, echodense material in the LA incompletely visualized, possibly representing artifact vs thrombus, or intracardiac mass, rec TEE, mod TR, mildly dilated RV, inc PASP  . History of nuclear stress test    a. 12/18: no sig ischemia, low risk  . HLD (hyperlipidemia)   . Left atrial mass    a. see echo from 04/2017 - ? mass vs LA thrombus in the setting of PAF -->Eliquis.  . Osteoporosis   . PAF (paroxysmal atrial fibrillation) ([place])    a. diagnosed 05/10/2017; b. CHADS2VASc --> 3 (age x 2, female)--> Eliquis 2.5 (age/wt).    Past Surgical History:  Procedure Laterality Date  . CATARACT EXTRACTION W/PHACO Left 09/14/2015   Procedure: CATARACT EXTRACTION PHACO AND INTRAOCULAR LENS PLACEMENT (IOC);  Surgeon: Ronnell Freshwater, MD;  Location: [place];  Service: Ophthalmology;  Laterality: Left;  MALYUGIN  . EYE SURGERY Right    cataract surgery schedule for Easter Monday 2017;     Family History  Problem Relation Age of Onset  . CAD Mother   . Arrhythmia Mother   . Liver cancer Father    Social History:  reports that she has never smoked. She has never used smokeless tobacco. She reports that she does not drink alcohol and does not use drugs.  Allergies:  Allergies  Allergen Reactions  . Azithromycin Shortness Of Breath and Other (See Comments)  Syncope Can not stand up, feels very sick, weak Syncope Syncope Can not stand up, feels very sick, weak Syncope Syncope Can not stand up, feels very sick, weak Syncope Can not stand up, feels very sick, weak Syncope  . Clindamycin Other (See Comments)    GI problem  GI problem  GI problem   . Clindamycin/Lincomycin Other (See Comments)    Reaction: unknown  . Hyoscyamine Diarrhea  . Lincomycin Other (See Comments)  . Maxitrol [Neomycin-Polymyxin-Dexameth] Other (See Comments)    Eye irritation  . Other Other (See Comments)    Eye  irritation Eye irritation Eye irritation  . Timolol Other (See Comments)    Eye irritation Eye irritation Eye irritation Eye irritation  . Codeine Other (See Comments) and Nausea Only    Altered mental status FATIGUE, SICK FEELING Altered mental status Altered mental status FATIGUE, SICK FEELING  . Penicillins Rash    Has patient had a PCN reaction causing immediate rash, facial/tongue/throat swelling, SOB or lightheadedness with hypotension: Yes Has patient had a PCN reaction causing severe rash involving mucus membranes or skin necrosis: No Has patient had a PCN reaction that required hospitalization: No Has patient had a PCN reaction occurring within the last 10 years: No If all of the above answers are "NO", then may proceed with Cephalosporin use.    Medications:  No current facility-administered medications on file prior to encounter.   Current Outpatient Medications on File Prior to Encounter  Medication Sig Dispense Refill  . acetaminophen (TYLENOL) 325 MG tablet Take 650 mg by mouth every 4 (four) hours as needed for mild pain or moderate pain.    . bisacodyl (DULCOLAX) 10 MG suppository Place 10 mg rectally as needed for moderate constipation.    . Cholecalciferol (VITAMIN D3) 50 MCG (2000 UT) TABS Take 2,000 Units by mouth at bedtime.     . clopidogrel (PLAVIX) 75 MG tablet Take 75 mg by mouth daily.    . diclofenac Sodium (VOLTAREN) 1 % GEL Apply 1 g topically every 6 (six) hours as needed (pain). Apply to left hand    . fexofenadine (ALLEGRA) 180 MG tablet Take 180 mg by mouth daily.    Marland Kitchen LUMIGAN 0.01 % SOLN Place 1 drop into both eyes at bedtime.     . miconazole (MICOTIN) 2 % cream Apply 1 application topically 2 (two) times daily. (affected area on back)    . mirtazapine (REMERON) 7.5 MG tablet Take 7.5 mg by mouth at bedtime.    Marland Kitchen morphine (ROXANOL) 20 MG/ML concentrated solution Take 5 mg by mouth every 2 (two) hours as needed for severe pain (dyspnea).    .  olmesartan (BENICAR) 5 MG tablet Take 5 mg by mouth at bedtime.     . ondansetron (ZOFRAN) 4 MG tablet Take 4 mg by mouth every 8 (eight) hours as needed for nausea or vomiting.    Marland Kitchen PARoxetine (PAXIL) 20 MG tablet Take 20 mg by mouth daily.       Scheduled: .  stroke: mapping our early stages of recovery book   Does not apply Once  . aspirin  324 mg Oral Once  . heparin  5,000 Units Subcutaneous Q8H  . latanoprost  1 drop Both Eyes QHS  . PARoxetine  20 mg Oral Daily  . rosuvastatin  20 mg Oral Daily   Continuous: . cefTRIAXone (ROCEPHIN)  IV    . potassium chloride 10 mEq (03/16/20 1002)  . sodium chloride 0.45 % with kcl  ROS: Has right thigh pain with movement and left thigh pain with/without movement. Her mouth feels dry. Other ROS as per HPI.   Physical Examination: Blood pressure 138/85, pulse 65, temperature 97.9 F (36.6 C), temperature source Oral, resp. rate (!) 21, SpO2 98 %.  General: Cachectic elderly female in NAD.  HEENT: [place]/AT. Oral mucosa appears poorly hydrated.  Lungs: Respirations unlabored Ext: No edema  Neurologic Examination: Mental Status: Awake and alert, but with decreased attention and increased latencies of verbal and motor responses. Speech fluent with comprehension intact to basic questions and commands. Mild dysarthria in the context of dry oral mucosa. She is aware of being in the hospital and recognizes her daughter. Daughter provides essentially all of the PMHx as well as history of recent symptoms. The patient appears slightly drowsy as well as fatigued.  Cranial Nerves: II:  Visual fields intact with no extinction to DSS. PERRL.  III,IV, VI: No ptosis. EOMI with slight saccadic quality to her pursuits.   V,VII: Smile symmetric, facial temp sensation decreased on the left.  VIII: hearing intact to voice IX,X: No hypophonia XI: Symmetric shoulder shrug XII: Midline tongue extension  Motor: Decreased muscle bulk proximally and distally  x 4.  LUE: Flexion contractures at elbow, wrist and digits. 4-/5 deltoid, 4/5 biceps and triceps. 3/5 WF/WE with decreased ROM. No movement to digits.  LLE 3/5 HF, 4-/5 KE, 1/5 ADF/APF. Increased extensor tone.  RUE 4+/5 proximally and distally RLE: Pain compromises testing of hip and knee flexion. Knee extension, ADF and APF 4+/5.  Sensory: Decreased temp sensation LUE and RLE.  Deep Tendon Reflexes:  Brisk, low amplitude reflexes on the left.  Plantars: Tonically upgoing toes, but toes flex with Babinski bilaterally, less briskly on the left.  Cerebellar: No ataxia with FNF on the right and left, but bradykinetic on the left. Fine action tremor noted bilaterally Gait: Unable to assess  Results for orders placed or performed during the hospital encounter of 03/15/20 (from the past 48 hour(s))  Comprehensive metabolic panel     Status: Abnormal   Collection Time: 03/15/20  1:05 PM  Result Value Ref Range   Sodium 141 135 - 145 mmol/L   Potassium 3.4 (L) 3.5 - 5.1 mmol/L    Comment: HEMOLYSIS AT THIS LEVEL MAY AFFECT RESULT   Chloride 103 98 - 111 mmol/L   CO2 25 22 - 32 mmol/L   Glucose, Bld 137 (H) 70 - 99 mg/dL    Comment: Glucose reference range applies only to samples taken after fasting for at least 8 hours.   BUN 12 8 - 23 mg/dL   Creatinine, Ser 0.94 0.44 - 1.00 mg/dL   Calcium 9.5 8.9 - 10.3 mg/dL   Total Protein 6.6 6.5 - 8.1 g/dL   Albumin 3.6 3.5 - 5.0 g/dL   AST 32 15 - 41 U/L   ALT 12 0 - 44 U/L   Alkaline Phosphatase 64 38 - 126 U/L   Total Bilirubin 1.0 0.3 - 1.2 mg/dL   GFR, Estimated 50 (L) >60 mL/min   Anion gap 13 5 - 15    Comment: Performed at [facility], [address] [place], [place] [address]  CBC with Differential     Status: Abnormal   Collection Time: 03/15/20  1:05 PM  Result Value Ref Range   WBC 6.8 4.0 - 10.5 K/uL   RBC 3.15 (L) 3.87 - 5.11 MIL/uL   Hemoglobin 9.7 (L) 12.0 - 15.0 g/dL   HCT  29.4 (L) 36 - 46 %   MCV 93.3 80.0 -  100.0 fL   MCH 30.8 26.0 - 34.0 pg   MCHC 33.0 30.0 - 36.0 g/dL   RDW 14.5 11.5 - 15.5 %   Platelets 268 150 - 400 K/uL   nRBC 0.0 0.0 - 0.2 %   Neutrophils Relative % 85 %   Neutro Abs 5.7 1.7 - 7.7 K/uL   Lymphocytes Relative 11 %   Lymphs Abs 0.8 0.7 - 4.0 K/uL   Monocytes Relative 4 %   Monocytes Absolute 0.2 0.1 - 1.0 K/uL   Eosinophils Relative 0 %   Eosinophils Absolute 0.0 0.0 - 0.5 K/uL   Basophils Relative 0 %   Basophils Absolute 0.0 0.0 - 0.1 K/uL   Immature Granulocytes 0 %   Abs Immature Granulocytes 0.03 0.00 - 0.07 K/uL    Comment: Performed at [facility], [address] [place], [place] [address]  Urinalysis, Complete w Microscopic     Status: Abnormal   Collection Time: 03/15/20  1:05 PM  Result Value Ref Range   Color, Urine YELLOW (A) YELLOW   APPearance TURBID (A) CLEAR   Specific Gravity, Urine 1.015 1.005 - 1.030   pH 5.0 5.0 - 8.0   Glucose, UA 50 (A) NEGATIVE mg/dL   Hgb urine dipstick NEGATIVE NEGATIVE   Bilirubin Urine NEGATIVE NEGATIVE   Ketones, ur NEGATIVE NEGATIVE mg/dL   Protein, ur >=300 (A) NEGATIVE mg/dL   Nitrite NEGATIVE NEGATIVE   Leukocytes,Ua MODERATE (A) NEGATIVE   RBC / HPF 21-50 0 - 5 RBC/hpf   WBC, UA >50 (H) 0 - 5 WBC/hpf   Bacteria, UA FEW (A) NONE SEEN   Squamous Epithelial / LPF 6-10 0 - 5   WBC Clumps PRESENT    Mucus PRESENT    Hyaline Casts, UA PRESENT    Non Squamous Epithelial PRESENT (A) NONE SEEN    Comment: Performed at [facility], [address] [place], [place] [address]  Lactic acid, plasma     Status: Abnormal   Collection Time: 03/15/20  1:05 PM  Result Value Ref Range   Lactic Acid, Venous 3.6 (HH) 0.5 - 1.9 mmol/L    Comment: CRITICAL RESULT CALLED TO, READ BACK BY AND VERIFIED WITH JACQUELINE FERGUSON RN AT 1339 ON 03/15/20 SNG Performed at [facility], [address], [place], [place] [address]   Respiratory Panel by RT PCR (Flu A&B, Covid) - Nasopharyngeal Swab      Status: None   Collection Time: 03/15/20  1:05 PM   Specimen: Nasopharyngeal Swab  Result Value Ref Range   SARS Coronavirus 2 by RT PCR NEGATIVE NEGATIVE    Comment: (NOTE) SARS-CoV-2 target nucleic acids are NOT DETECTED.  The SARS-CoV-2 RNA is generally detectable in upper respiratoy specimens during the acute phase of infection. The lowest concentration of SARS-CoV-2 viral copies this assay can detect is 131 copies/mL. A negative result does not preclude SARS-Cov-2 infection and should not be used as the sole basis for treatment or other patient management decisions. A negative result may occur with  improper specimen collection/handling, submission of specimen other than nasopharyngeal swab, presence of viral mutation(s) within the areas targeted by this assay, and inadequate number of viral copies (<131 copies/mL). A negative result must be combined with clinical observations, patient history, and epidemiological information. The expected result is Negative.  Fact Sheet for Patients:  PinkCheek.be  Fact Sheet for Healthcare Providers:  GravelBags.it  This test  is no t yet approved or cleared by the [place] and  has been authorized for detection and/or diagnosis of SARS-CoV-2 by FDA under an Emergency Use Authorization (EUA). This EUA will remain  in effect (meaning this test can be used) for the duration of the COVID-19 declaration under Section 564(b)(1) of the Act, 21 U.S.C. section 360bbb-3(b)(1), unless the authorization is terminated or revoked sooner.     Influenza A by PCR NEGATIVE NEGATIVE   Influenza B by PCR NEGATIVE NEGATIVE    Comment: (NOTE) The Xpert Xpress SARS-CoV-2/FLU/RSV assay is intended as an aid in  the diagnosis of influenza from Nasopharyngeal swab specimens and  should not be used as a sole basis for treatment. Nasal washings and  aspirates are unacceptable for Xpert Xpress  SARS-CoV-2/FLU/RSV  testing.  Fact Sheet for Patients: PinkCheek.be  Fact Sheet for Healthcare Providers: GravelBags.it  This test is not yet approved or cleared by the [place] FDA and  has been authorized for detection and/or diagnosis of SARS-CoV-2 by  FDA under an Emergency Use Authorization (EUA). This EUA will remain  in effect (meaning this test can be used) for the duration of the  Covid-19 declaration under Section 564(b)(1) of the Act, 21  U.S.C. section 360bbb-3(b)(1), unless the authorization is  terminated or revoked. Performed at [facility], [address] [place], [place] [address]   Culture, blood (routine x 2)     Status: None (Preliminary result)   Collection Time: 03/15/20  1:07 PM   Specimen: BLOOD  Result Value Ref Range   Specimen Description BLOOD RIGHT ARM    Special Requests      BOTTLES DRAWN AEROBIC AND ANAEROBIC Blood Culture adequate volume   Culture      NO GROWTH < 24 HOURS Performed at [facility], [address] [place], [place] [address]    Report Status PENDING   Culture, blood (routine x 2)     Status: None (Preliminary result)   Collection Time: 03/15/20  2:34 PM   Specimen: BLOOD  Result Value Ref Range   Specimen Description BLOOD BLOOD RIGHT HAND    Special Requests      BOTTLES DRAWN AEROBIC AND ANAEROBIC Blood Culture results may not be optimal due to an inadequate volume of blood received in culture bottles   Culture      NO GROWTH < 24 HOURS Performed at [facility], [address], [place], [place] [address]    Report Status PENDING   Lactic acid, plasma     Status: None   Collection Time: 03/15/20  5:04 PM  Result Value Ref Range   Lactic Acid, Venous 1.4 0.5 - 1.9 mmol/L    Comment: Performed at [facility], [address], [place], [place] [address]  Basic metabolic panel     Status: Abnormal   Collection Time:  03/16/20  5:26 AM  Result Value Ref Range   Sodium 146 (H) 135 - 145 mmol/L   Potassium 2.5 (LL) 3.5 - 5.1 mmol/L    Comment: CRITICAL RESULT CALLED TO, READ BACK BY AND VERIFIED WITH  JENNA ELLINGTON AT 0631 03/16/20 SDR    Chloride 109 98 - 111 mmol/L   CO2 25 22 - 32 mmol/L   Glucose, Bld 81 70 - 99 mg/dL    Comment: Glucose reference range applies only to samples taken after fasting for at least 8 hours.   BUN 13 8 - 23 mg/dL   Creatinine, Ser  0.71 0.44 - 1.00 mg/dL   Calcium 8.3 (L) 8.9 - 10.3 mg/dL   GFR, Estimated >60 >60 mL/min   Anion gap 12 5 - 15    Comment: Performed at [facility], [address] [place], [place] [address]  Lipid panel     Status: Abnormal   Collection Time: 03/16/20  5:26 AM  Result Value Ref Range   Cholesterol 234 (H) 0 - 200 mg/dL   Triglycerides 73 <150 mg/dL   HDL 51 >40 mg/dL   Total CHOL/HDL Ratio 4.6 RATIO   VLDL 15 0 - 40 mg/dL   LDL Cholesterol 168 (H) 0 - 99 mg/dL    Comment:        Total Cholesterol/HDL:CHD Risk Coronary Heart Disease Risk Table                     Men   Women  1/2 Average Risk   3.4   3.3  Average Risk       5.0   4.4  2 X Average Risk   9.6   7.1  3 X Average Risk  23.4   11.0        Use the calculated Patient Ratio above and the CHD Risk Table to determine the patient's CHD Risk.        ATP III CLASSIFICATION (LDL):  <100     mg/dL   Optimal  100-129  mg/dL   Near or Above                    Optimal  130-159  mg/dL   Borderline  160-189  mg/dL   High  >190     mg/dL   Very High Performed at [place], [place] [address]    CT Head Wo Contrast  Result Date: 03/15/2020 CLINICAL DATA:  Mental status change EXAM: CT HEAD WITHOUT CONTRAST TECHNIQUE: Contiguous axial images were obtained from the base of the skull through the vertex without intravenous contrast. COMPARISON:  12/07/2019 head CT and prior.  10/29/2019 MRI head. FINDINGS: Brain: No intracranial  hemorrhage. Focal left corona radiata hypodensity is new (2:20). Sequela of remote right frontal and right occipitotemporal insults. Mild cerebral atrophy with ex vacuo dilatation. Scattered and confluent hypodense foci involving the periventricular and subcortical white matter are nonspecific however commonly associated with chronic microvascular ischemic changes. No mass lesion. No midline shift, ventriculomegaly or extra-axial fluid collection. Vascular: No hyperdense vessel or unexpected calcification. Skull: No acute finding. Sinuses/Orbits: Normal orbits. Layering right maxillary and sphenoid secretions. No mastoid effusion. Other: None. IMPRESSION: Left corona radiata hypodensity concerning for acute/subacute insult. Remote right frontal and right occipitotemporal insults. Mild cerebral atrophy and advanced chronic microvascular ischemic changes. Electronically Signed   By: Primitivo Gauze M.D.   On: 03/15/2020 14:44   MR BRAIN WO CONTRAST  Result Date: 03/15/2020 CLINICAL DATA:  86 year old female with altered mental status. History of prior stroke. EXAM: MRI HEAD WITHOUT CONTRAST TECHNIQUE: Multiplanar, multiecho pulse sequences of the brain and surrounding structures were obtained without intravenous contrast. COMPARISON:  Head CT earlier today.  Brain MRI 10/29/2019. FINDINGS: Brain: Subtle increased trace diffusion signal in the left corona radiata and centrum semiovale where new confluent white matter T2 and FLAIR hyperintensity has developed since June (series 12, image 18). However, this is not clearly restricted on ADC. No associated hemorrhage or mass effect at that site. No diffusion restriction elsewhere. Stable multifocal cortical and  subcortical encephalomalacia including in the superior right MCA and right MCA/PCA watershed areas. Underlying confluent bilateral cerebral white matter T2 and FLAIR hyperintensity is stable outside of the left superior frontal lobe. Chronic microhemorrhage  in the right periatrial white matter is stable. A microhemorrhage is visible in the midline dorsal pons today, that area was obscured previously on SWI. Patchy T2 heterogeneity in the pons is stable. Interestingly, the deep gray nuclei and cerebellum remain within normal limits. No midline shift, mass effect, evidence of mass lesion, ventriculomegaly, extra-axial collection or acute intracranial hemorrhage. Cervicomedullary junction and pituitary are within normal limits. Vascular: Major intracranial vascular flow voids appear stable since June. Skull and upper cervical spine: Stable visible cervical spine, chronic disc disease at C2-C3. Normal bone marrow signal. Sinuses/Orbits: Stable and negative orbits. Chronic right sphenoid and maxillary sinus mucosal thickening. Other: Mastoids remain clear. Grossly normal visible internal auditory structures. Scalp and face appear negative. IMPRESSION: 1. Late subacute white matter infarct in the left corona radiata/centrum semiovale. No associated hemorrhage or mass effect. 2. Elsewhere stable ischemic disease since the June MRI. No acute intracranial abnormality. Electronically Signed   By: Genevie Ann M.D.   On: 03/15/2020 21:30   US Carotid Bilateral (at [facility] and [facility] only)  Result Date: 03/16/2020 CLINICAL DATA:  Hypertension, stroke, hyperlipidemia EXAM: BILATERAL CAROTID DUPLEX ULTRASOUND TECHNIQUE: Pearline Cables scale imaging, color Doppler and duplex ultrasound were performed of bilateral carotid and vertebral arteries in the neck. COMPARISON:  None. FINDINGS: Criteria: Quantification of carotid stenosis is based on velocity parameters that correlate the residual internal carotid diameter with NASCET-based stenosis levels, using the diameter of the distal internal carotid lumen as the denominator for stenosis measurement. The following velocity measurements were obtained: RIGHT ICA: 64/23 cm/sec CCA: 97/67 cm/sec SYSTOLIC ICA/CCA RATIO:  1.6 ECA: 88 cm/sec LEFT ICA: 59/26  cm/sec CCA: 34/19 cm/sec SYSTOLIC ICA/CCA RATIO:  1.4 ECA: 60 cm/sec RIGHT CAROTID ARTERY: Mild to moderate echogenic shadowing plaque formation. No hemodynamically significant right ICA stenosis, velocity elevation, or turbulent flow. Degree of narrowing less than 50%. RIGHT VERTEBRAL ARTERY:  Normal antegrade flow LEFT CAROTID ARTERY: Similar scattered mild echogenic plaque formation. No hemodynamically significant left ICA stenosis, velocity elevation, or turbulent flow. LEFT VERTEBRAL ARTERY:  Normal antegrade flow IMPRESSION: Bilateral carotid atherosclerosis. No hemodynamically significant ICA stenosis. Degree of narrowing less than 50% bilaterally by ultrasound criteria. Patent antegrade vertebral flow bilaterally Electronically Signed   By: Jerilynn Mages.  Shick M.D.   On: 03/16/2020 08:52   DG Chest Portable 1 View  Result Date: 03/15/2020 CLINICAL DATA:  Altered mental status EXAM: PORTABLE CHEST 1 VIEW COMPARISON:  December 07, 2019 FINDINGS: There is no edema or airspace opacity. Heart is mildly enlarged with pulmonary vascularity normal. No adenopathy. There is aortic atherosclerosis. No evident pneumothorax. No bone lesions. IMPRESSION: Stable cardiac prominence.  No edema or airspace opacity. Aortic Atherosclerosis (ICD10-I70.0). Electronically Signed   By: Lowella Grip III M.D.   On: 03/15/2020 14:02    Assessment: 86 y.o. female presenting with confusion after an episode of unresponsiveness 1. Exam reveals spastic left hemiparesis from prior stroke. No definitively localizable findings corresponding to the late subacute left corona radiata stroke seen on MRI.  2. CT head and MRI brain: CT head revealed a left corona radiata hypodensity initially concerning for an acute/subacute insult, which was revealed to be a late-subacute versus early chronic small vessel ischemic white matter infarction on subsequent MRI performed Sunday evening. Also noted on imaging were remote right frontal  and right  occipitotemporal strokes, mild cerebral atrophy and advanced chronic microvascular ischemic changes.  3. Carotid ultrasound:  Bilateral carotid atherosclerosis. No hemodynamically significant ICA stenosis. Degree of narrowing less than 50% bilaterally by ultrasound criteria.Stroke Risk Factors - HTN, HLD, stroke with residual left sided deficits and chronic atrial fibrillation 4. Overall impression is that her presentation was most likely due to a transient hypotensive episode rather than an acute stroke. The MRI findings are most c/w a late subacute or early chronic ischemic infarction in the left corona radiata. Also on DDx would be a toxic, infectious or metabolic etiology. Appears volume depleted on exam.   Recommendations: 1. CTA of head or MRA of the brain without contrast 2. TTE completed with official report pending. 3. Telemetry monitoring 4. Frequent neuro checks 5. PT consult, OT consult. Goals for SNF should include OOB to chair daily.  6. IV hydration.  7. Speech consult. If able to take PO, encourage PO hydration both inpatient and at [facility].  8. She has been started on rosuvastatin by primary team. Unclear if the benefits of this medication outweigh risks given the patient's advanced age.  9. Has been started on ASA by primary team. Home Plavix is not on her current hospital medications list. Would restart Plavix and continue ASA for DAPT, as she is classifiable as having failed Plavix monotherapy.  10. Given her chronic atrial fibrillation, anticoagulation would provide the most risk reduction for stroke. She is bed/chair bound and hence at low risk for falls. Would need to discuss risks/benefits further with family regarding stroke/MI reduction with anticoagulation versus antiplatelet therapy, in the context of increased bleeding risk with the former. Given her advanced age and decreased life expectancy, the best option may be antiplatelet therapy.     @Electronically  signed: Dr. Kerney Elbe  03/16/2020, 10:01 AM

## 2020-03-16 NOTE — ED Notes (Signed)
Verbal from Attending and SLP to switch pt back to reg diet as had already been evaluated by them.

## 2020-03-16 NOTE — Progress Notes (Signed)
OT Cancellation Note  Patient Details Name: Christy Waters MRN: [id number] DOB: November 21, 1918   Cancelled Treatment:    Reason Eval/Treat Not Completed: Medical issues which prohibited therapy. Pt with critical lab value for K+ of 2.5 which is contraindicated for OT intervention at this time. OT will follow up when pt is able to safely participate in therapeutic interventions.   Darleen Crocker, MS, OTR/L , CBIS ascom [phone]  03/16/20, 8:27 AM  03/16/2020, 8:27 AM

## 2020-03-16 NOTE — ED Notes (Signed)
Pt had loose BM. Peri care provided. Bed pad changed and briefs changed. Pt repositioned. Pure wick end changed.

## 2020-03-16 NOTE — ED Notes (Signed)
Pt repositioned. Visitor remains at bedside.

## 2020-03-16 NOTE — ED Notes (Signed)
Pt/family at bedside updated about pt's room assignment.

## 2020-03-16 NOTE — Consult Note (Signed)
Consultation Note Date: 03/16/2020   Patient Name: Christy Waters  DOB: May 13, 1919  MRN: [id number]  Age / Sex: 86 y.o., female  PCP: Rusty Aus, MD Referring Physician: Patrecia Pour, MD  Reason for Consultation: Establishing goals of care  HPI/Patient Profile: Christy Waters is an 86 y.o. female with a PMHx of HTN, HLD, stroke with residual left sided deficits and chronic atrial fibrillation who presented to the [facility] ED on Sunday from her SNF with AMS. She was found confused by her daughter, relative to her baseline of being alert and oriented x 4.  Clinical Assessment and Goals of Care: Patient is resting in bed, no family at bedside. She is able to tell me her name, that we are at [facility] and it is a hospital, and that she is here because she had a stroke. She states she has 3 children, and names them.   Discussed multiple scenerios and plans of care. Patient states she feels QOL is important to her, and she wants to do things on her terms. She states she would like to not return to the hospital, and just focus on comfort for what time she has left until she leaves this earth by death to go to heaven. Discussed having a family meeting, and patient would like this. Called emergency contact Katharine Look and VM left.       SUMMARY OF RECOMMENDATIONS    VM left for daughter Katharine Look to arrange COG meeting. Will call back in the morning.        Primary Diagnoses: Present on Admission: . Acute CVA (cerebrovascular accident) ([place]) . Acute lower UTI . Atherosclerosis of aorta ([place]) . HLD (hyperlipidemia) . Moderate episode of recurrent major depressive disorder ([place]) . PAF (paroxysmal atrial fibrillation) ([place])   I have reviewed the medical record, interviewed the patient and family, and examined the patient. The following aspects are pertinent.  Past Medical History:  Diagnosis Date  .  Anemia    distant past  . Arthritis    fingers, hips  . Cancer ([place])    skin cancer; some spots on left side of face; been 5 years since last treated;   . Headache   . History of echocardiogram    a. TTE 04/06/17: EF > 55%, no RWMA, no LA mass, severe TR, RV systolic dysfunction, elevated PASP; b. TTE 04/2017: EF 65-70%, no RWMA, Gr1DD, calcified mitral annulus w/ mild MR, mild to mod dilated LA w/ irregular, echodense material in the LA incompletely visualized, possibly representing artifact vs thrombus, or intracardiac mass, rec TEE, mod TR, mildly dilated RV, inc PASP  . History of nuclear stress test    a. 12/18: no sig ischemia, low risk  . HLD (hyperlipidemia)   . Left atrial mass    a. see echo from 04/2017 - ? mass vs LA thrombus in the setting of PAF -->Eliquis.  . Osteoporosis   . PAF (paroxysmal atrial fibrillation) ([place])    a. diagnosed 05/10/2017; b. CHADS2VASc --> 3 (age x 2,  female)--> Eliquis 2.5 (age/wt).   Social History   Socioeconomic History  . Marital status: Widowed    Spouse name: Not on file  . Number of children: Not on file  . Years of education: Not on file  . Highest education level: Not on file  Occupational History  . Not on file  Tobacco Use  . Smoking status: Never Smoker  . Smokeless tobacco: Never Used  Vaping Use  . Vaping Use: Never used  Substance and Sexual Activity  . Alcohol use: No  . Drug use: No  . Sexual activity: Never    Birth control/protection: None  Other Topics Concern  . Not on file  Social History Narrative  . Not on file   Social Determinants of Health   Financial Resource Strain:   . Difficulty of Paying Living Expenses: Not on file  Food Insecurity:   . Worried About Charity fundraiser in the Last Year: Not on file  . Ran Out of Food in the Last Year: Not on file  Transportation Needs:   . Lack of Transportation (Medical): Not on file  . Lack of Transportation (Non-Medical): Not on file  Physical Activity:    . Days of Exercise per Week: Not on file  . Minutes of Exercise per Session: Not on file  Stress:   . Feeling of Stress : Not on file  Social Connections:   . Frequency of Communication with Friends and Family: Not on file  . Frequency of Social Gatherings with Friends and Family: Not on file  . Attends Religious Services: Not on file  . Active Member of Clubs or Organizations: Not on file  . Attends Archivist Meetings: Not on file  . Marital Status: Not on file   Family History  Problem Relation Age of Onset  . CAD Mother   . Arrhythmia Mother   . Liver cancer Father    Scheduled Meds: .  stroke: mapping our early stages of recovery book   Does not apply Once  . aspirin  324 mg Oral Once  . heparin  5,000 Units Subcutaneous Q8H  . latanoprost  1 drop Both Eyes QHS  . PARoxetine  20 mg Oral Daily  . rosuvastatin  20 mg Oral Daily   Continuous Infusions: . cefTRIAXone (ROCEPHIN)  IV    . magnesium sulfate bolus IVPB 2 g (03/16/20 1450)  . sodium chloride 0.45 % with kcl Stopped (03/16/20 1438)   PRN Meds:.acetaminophen **OR** acetaminophen (TYLENOL) oral liquid 160 mg/5 mL **OR** acetaminophen, senna-docusate Medications Prior to Admission:  Prior to Admission medications   Medication Sig Start Date End Date Taking? Authorizing Provider  acetaminophen (TYLENOL) 325 MG tablet Take 650 mg by mouth every 4 (four) hours as needed for mild pain or moderate pain.   Yes [provider]  bisacodyl (DULCOLAX) 10 MG suppository Place 10 mg rectally as needed for moderate constipation.   Yes [provider]  Cholecalciferol (VITAMIN D3) 50 MCG (2000 UT) TABS Take 2,000 Units by mouth at bedtime.    Yes [provider]  clopidogrel (PLAVIX) 75 MG tablet Take 75 mg by mouth daily.   Yes [provider]  diclofenac Sodium (VOLTAREN) 1 % GEL Apply 1 g topically every 6 (six) hours as needed (pain). Apply to left hand   Yes [provider]  fexofenadine (ALLEGRA) 180 MG tablet Take 180 mg by mouth daily.   Yes [provider]  LUMIGAN 0.01 % SOLN  Place 1 drop into both eyes at bedtime.    Yes [provider]  miconazole (MICOTIN) 2 % cream Apply 1 application topically 2 (two) times daily. (affected area on back)   Yes [provider]  mirtazapine (REMERON) 7.5 MG tablet Take 7.5 mg by mouth at bedtime.   Yes [provider]  morphine (ROXANOL) 20 MG/ML concentrated solution Take 5 mg by mouth every 2 (two) hours as needed for severe pain (dyspnea).   Yes [provider]  olmesartan (BENICAR) 5 MG tablet Take 5 mg by mouth at bedtime.    Yes [provider]  ondansetron (ZOFRAN) 4 MG tablet Take 4 mg by mouth every 8 (eight) hours as needed for nausea or vomiting.   Yes [provider]  PARoxetine (PAXIL) 20 MG tablet Take 20 mg by mouth daily.    Yes [provider]   Allergies  Allergen Reactions  . Azithromycin Shortness Of Breath and Other (See Comments)    Syncope Can not stand up, feels very sick, weak Syncope Syncope Can not stand up, feels very sick, weak Syncope Syncope Can not stand up, feels very sick, weak Syncope Can not stand up, feels very sick, weak Syncope  . Clindamycin Other (See Comments)    GI problem  GI problem  GI problem   . Clindamycin/Lincomycin Other (See Comments)    Reaction: unknown  . Hyoscyamine Diarrhea  . Lincomycin Other (See Comments)  . Maxitrol [Neomycin-Polymyxin-Dexameth] Other (See Comments)    Eye irritation  . Other Other (See Comments)    Eye irritation Eye irritation Eye irritation  . Timolol Other (See Comments)    Eye irritation Eye irritation Eye irritation Eye irritation  . Codeine Other (See Comments) and Nausea Only    Altered mental status FATIGUE, SICK FEELING Altered mental status Altered mental status FATIGUE, SICK FEELING  . Penicillins Rash    Has patient had a PCN  reaction causing immediate rash, facial/tongue/throat swelling, SOB or lightheadedness with hypotension: Yes Has patient had a PCN reaction causing severe rash involving mucus membranes or skin necrosis: No Has patient had a PCN reaction that required hospitalization: No Has patient had a PCN reaction occurring within the last 10 years: No If all of the above answers are "NO", then may proceed with Cephalosporin use.   Review of Systems  All other systems reviewed and are negative.   Physical Exam Pulmonary:     Effort: Pulmonary effort is normal.  Skin:    General: Skin is warm and dry.  Neurological:     Mental Status: She is alert.     Vital Signs: BP (!) 156/84   Pulse 78   Temp 97.9 F (36.6 C) (Oral)   Resp (!) 21   SpO2 100%  Pain Scale: Faces       SpO2: SpO2: 100 % O2 Device:SpO2: 100 % O2 Flow Rate: .   IO: Intake/output summary: No intake or output data in the 24 hours ending 03/16/20 1527  LBM:   Baseline Weight:   Most recent weight:       Palliative Assessment/Data:     Time In: 11:30 Time Out: 12:00 Time Total: 30 min Greater than 50%  of this time was spent counseling and coordinating care related to the above assessment and plan.  Signed by: Asencion Gowda, NP   Please contact Palliative Medicine Team phone at [phone] for questions and concerns.  For individual provider: See Shea Evans

## 2020-03-16 NOTE — ED Notes (Signed)
US at bedside

## 2020-03-17 LAB — BASIC METABOLIC PANEL
Anion gap: 9 (ref 5–15)
BUN: 11 mg/dL (ref 8–23)
CO2: 25 mmol/L (ref 22–32)
Calcium: 8.8 mg/dL — ABNORMAL LOW (ref 8.9–10.3)
Chloride: 107 mmol/L (ref 98–111)
Creatinine, Ser: 0.62 mg/dL (ref 0.44–1.00)
GFR, Estimated: >60 mL/min (ref >60)
Glucose, Bld: 92 mg/dL (ref 70–99)
Potassium: 3.2 mmol/L — ABNORMAL LOW (ref 3.5–5.1)
Sodium: 141 mmol/L (ref 135–145)

## 2020-03-17 LAB — MAGNESIUM: Magnesium: 2 mg/dL (ref 1.7–2.4)

## 2020-03-17 LAB — HEMOGLOBIN A1C
Hgb A1c MFr Bld: 4.9 % (ref 4.8–5.6)
Mean Plasma Glucose: 94 mg/dL

## 2020-03-17 MED ORDER — ENSURE ENLIVE PO LIQD
237.0000 mL | Freq: Two times a day (BID) | ORAL | Status: DC
  Administered 2020-03-17 – 2020-03-18 (×2): 237 mL via ORAL

## 2020-03-17 MED ORDER — NYSTATIN 100000 UNIT/GM EX POWD
Freq: Three times a day (TID) | CUTANEOUS | Status: DC
  Filled 2020-03-17: qty 15

## 2020-03-17 MED ORDER — HYDRALAZINE HCL 50 MG PO TABS
25.0000 mg | ORAL_TABLET | Freq: Four times a day (QID) | ORAL | Status: DC | PRN
  Administered 2020-03-17 – 2020-03-18 (×2): 25 mg via ORAL
  Filled 2020-03-17 (×2): qty 1

## 2020-03-17 MED ORDER — POTASSIUM CHLORIDE CRYS ER 20 MEQ PO TBCR
40.0000 meq | EXTENDED_RELEASE_TABLET | Freq: Once | ORAL | Status: AC
  Administered 2020-03-17: 40 meq via ORAL
  Filled 2020-03-17: qty 2

## 2020-03-17 NOTE — Progress Notes (Signed)
Subjective: The patient feels better than yesterday, but she states that her mouth is still dry.   Objective: Current vital signs: BP (!) 190/90 (BP Location: Right Arm)   Pulse 65   Temp 98.2 F (36.8 C) (Oral)   Resp 20   SpO2 96%  Vital signs in last 24 hours: Temp:  [98 F (36.7 C)-98.2 F (36.8 C)] 98.2 F (36.8 C) (10/19 0742) Pulse Rate:  [63-89] 65 (10/19 0742) Resp:  [14-21] 20 (10/19 0742) BP: (123-190)/(68-99) 190/90 (10/19 0742) SpO2:  [96 %-100 %] 96 % (10/19 0742)  Intake/Output from previous day: 10/18 0701 - 10/19 0700 In: 833.7 [I.V.:306; IV Piggyback:527.8] Out: -  Intake/Output this shift: No intake/output data recorded. Nutritional status:  Diet Order            Diet regular Room service appropriate? Yes; Fluid consistency: Thin  Diet effective now                 General: Cachectic elderly female in NAD.  HEENT: [place]/AT. Oral mucosa appears significantly better hydrated than yesterday.   Lungs: Respirations unlabored Ext: No edema  Neurologic Examination: Mental Status: Awake and alert. Disoriented to day, month and year. Knows that she is in the hospital. Speech is fluent with intact comprehension for basic commands. No dysarthria.  Cranial Nerves: II:  Pupils equal. Tracks normally.   III,IV, VI: No ptosis. EOMI.   VII: Smile symmetric VIII: hearing intact to voice IX,X: No hypophonia XI: Symmetric  XII: No lingual dysarthria Motor: Decreased muscle bulk proximally and distally x 4.  LUE: Flexion contractures at elbow, wrist and digits. 4-/5 deltoid, 4/5 biceps and triceps. 3/5 WF/WE with decreased ROM. No movement to digits.  LLE 3/5 HF, 4-/5 KE, 1/5 ADF/APF. Increased extensor tone.  RUE 4+/5 proximally and distally RLE: 3/5 hip flexion. Knee extension, ADF and APF 4+/5.  Cerebellar: Fine action tremor again noted bilaterally Gait: Unable to assess  Lab Results: Results for orders placed or performed during the hospital encounter  of 03/15/20 (from the past 48 hour(s))  Comprehensive metabolic panel     Status: Abnormal   Collection Time: 03/15/20  1:05 PM  Result Value Ref Range   Sodium 141 135 - 145 mmol/L   Potassium 3.4 (L) 3.5 - 5.1 mmol/L    Comment: HEMOLYSIS AT THIS LEVEL MAY AFFECT RESULT   Chloride 103 98 - 111 mmol/L   CO2 25 22 - 32 mmol/L   Glucose, Bld 137 (H) 70 - 99 mg/dL    Comment: Glucose reference range applies only to samples taken after fasting for at least 8 hours.   BUN 12 8 - 23 mg/dL   Creatinine, Ser 0.94 0.44 - 1.00 mg/dL   Calcium 9.5 8.9 - 10.3 mg/dL   Total Protein 6.6 6.5 - 8.1 g/dL   Albumin 3.6 3.5 - 5.0 g/dL   AST 32 15 - 41 U/L   ALT 12 0 - 44 U/L   Alkaline Phosphatase 64 38 - 126 U/L   Total Bilirubin 1.0 0.3 - 1.2 mg/dL   GFR, Estimated 50 (L) >60 mL/min   Anion gap 13 5 - 15    Comment: Performed at [facility], [address] [place], [place] [address]  CBC with Differential     Status: Abnormal   Collection Time: 03/15/20  1:05 PM  Result Value Ref Range   WBC 6.8 4.0 - 10.5 K/uL   RBC 3.15 (L) 3.87 - 5.11 MIL/uL  Hemoglobin 9.7 (L) 12.0 - 15.0 g/dL   HCT 29.4 (L) 36 - 46 %   MCV 93.3 80.0 - 100.0 fL   MCH 30.8 26.0 - 34.0 pg   MCHC 33.0 30.0 - 36.0 g/dL   RDW 14.5 11.5 - 15.5 %   Platelets 268 150 - 400 K/uL   nRBC 0.0 0.0 - 0.2 %   Neutrophils Relative % 85 %   Neutro Abs 5.7 1.7 - 7.7 K/uL   Lymphocytes Relative 11 %   Lymphs Abs 0.8 0.7 - 4.0 K/uL   Monocytes Relative 4 %   Monocytes Absolute 0.2 0.1 - 1.0 K/uL   Eosinophils Relative 0 %   Eosinophils Absolute 0.0 0.0 - 0.5 K/uL   Basophils Relative 0 %   Basophils Absolute 0.0 0.0 - 0.1 K/uL   Immature Granulocytes 0 %   Abs Immature Granulocytes 0.03 0.00 - 0.07 K/uL    Comment: Performed at [facility], [address] [place], [place] [address]  Urinalysis, Complete w Microscopic     Status: Abnormal   Collection Time: 03/15/20  1:05 PM  Result Value Ref Range    Color, Urine YELLOW (A) YELLOW   APPearance TURBID (A) CLEAR   Specific Gravity, Urine 1.015 1.005 - 1.030   pH 5.0 5.0 - 8.0   Glucose, UA 50 (A) NEGATIVE mg/dL   Hgb urine dipstick NEGATIVE NEGATIVE   Bilirubin Urine NEGATIVE NEGATIVE   Ketones, ur NEGATIVE NEGATIVE mg/dL   Protein, ur >=300 (A) NEGATIVE mg/dL   Nitrite NEGATIVE NEGATIVE   Leukocytes,Ua MODERATE (A) NEGATIVE   RBC / HPF 21-50 0 - 5 RBC/hpf   WBC, UA >50 (H) 0 - 5 WBC/hpf   Bacteria, UA FEW (A) NONE SEEN   Squamous Epithelial / LPF 6-10 0 - 5   WBC Clumps PRESENT    Mucus PRESENT    Hyaline Casts, UA PRESENT    Non Squamous Epithelial PRESENT (A) NONE SEEN    Comment: Performed at [facility], [address] [place], [place] [address]  Lactic acid, plasma     Status: Abnormal   Collection Time: 03/15/20  1:05 PM  Result Value Ref Range   Lactic Acid, Venous 3.6 (HH) 0.5 - 1.9 mmol/L    Comment: CRITICAL RESULT CALLED TO, READ BACK BY AND VERIFIED WITH JACQUELINE FERGUSON RN AT 1339 ON 03/15/20 SNG Performed at [facility], [address], [place], [place] [address]   Respiratory Panel by RT PCR (Flu A&B, Covid) - Nasopharyngeal Swab     Status: None   Collection Time: 03/15/20  1:05 PM   Specimen: Nasopharyngeal Swab  Result Value Ref Range   SARS Coronavirus 2 by RT PCR NEGATIVE NEGATIVE    Comment: (NOTE) SARS-CoV-2 target nucleic acids are NOT DETECTED.  The SARS-CoV-2 RNA is generally detectable in upper respiratoy specimens during the acute phase of infection. The lowest concentration of SARS-CoV-2 viral copies this assay can detect is 131 copies/mL. A negative result does not preclude SARS-Cov-2 infection and should not be used as the sole basis for treatment or other patient management decisions. A negative result may occur with  improper specimen collection/handling, submission of specimen other than nasopharyngeal swab, presence of viral mutation(s) within the areas  targeted by this assay, and inadequate number of viral copies (<131 copies/mL). A negative result must be combined with clinical observations, patient history, and epidemiological information. The expected result is Negative.  Fact Sheet for Patients:  PinkCheek.be  Fact Sheet for Healthcare Providers:  GravelBags.it  This test is no t yet approved or cleared by the [place] FDA and  has been authorized for detection and/or diagnosis of SARS-CoV-2 by FDA under an Emergency Use Authorization (EUA). This EUA will remain  in effect (meaning this test can be used) for the duration of the COVID-19 declaration under Section 564(b)(1) of the Act, 21 U.S.C. section 360bbb-3(b)(1), unless the authorization is terminated or revoked sooner.     Influenza A by PCR NEGATIVE NEGATIVE   Influenza B by PCR NEGATIVE NEGATIVE    Comment: (NOTE) The Xpert Xpress SARS-CoV-2/FLU/RSV assay is intended as an aid in  the diagnosis of influenza from Nasopharyngeal swab specimens and  should not be used as a sole basis for treatment. Nasal washings and  aspirates are unacceptable for Xpert Xpress SARS-CoV-2/FLU/RSV  testing.  Fact Sheet for Patients: PinkCheek.be  Fact Sheet for Healthcare Providers: GravelBags.it  This test is not yet approved or cleared by the [place] FDA and  has been authorized for detection and/or diagnosis of SARS-CoV-2 by  FDA under an Emergency Use Authorization (EUA). This EUA will remain  in effect (meaning this test can be used) for the duration of the  Covid-19 declaration under Section 564(b)(1) of the Act, 21  U.S.C. section 360bbb-3(b)(1), unless the authorization is  terminated or revoked. Performed at [facility], [address] [place], [place] [address]   Culture, blood (routine x 2)     Status: None (Preliminary result)    Collection Time: 03/15/20  1:07 PM   Specimen: BLOOD  Result Value Ref Range   Specimen Description BLOOD RIGHT ARM    Special Requests      BOTTLES DRAWN AEROBIC AND ANAEROBIC Blood Culture adequate volume   Culture      NO GROWTH 2 DAYS Performed at [facility], [address], [place], [place] [address]    Report Status PENDING   Culture, blood (routine x 2)     Status: None (Preliminary result)   Collection Time: 03/15/20  2:34 PM   Specimen: BLOOD  Result Value Ref Range   Specimen Description BLOOD BLOOD RIGHT HAND    Special Requests      BOTTLES DRAWN AEROBIC AND ANAEROBIC Blood Culture results may not be optimal due to an inadequate volume of blood received in culture bottles   Culture      NO GROWTH 2 DAYS Performed at [facility], [address], [place], [place] [address]    Report Status PENDING   Lactic acid, plasma     Status: None   Collection Time: 03/15/20  5:04 PM  Result Value Ref Range   Lactic Acid, Venous 1.4 0.5 - 1.9 mmol/L    Comment: Performed at [facility], [address], [place], [place] [address]  Basic metabolic panel     Status: Abnormal   Collection Time: 03/16/20  5:26 AM  Result Value Ref Range   Sodium 146 (H) 135 - 145 mmol/L   Potassium 2.5 (LL) 3.5 - 5.1 mmol/L    Comment: CRITICAL RESULT CALLED TO, READ BACK BY AND VERIFIED WITH  JENNA ELLINGTON AT 0631 03/16/20 SDR    Chloride 109 98 - 111 mmol/L   CO2 25 22 - 32 mmol/L   Glucose, Bld 81 70 - 99 mg/dL    Comment: Glucose reference range applies only to samples taken after fasting for at least 8 hours.   BUN 13 8 -  23 mg/dL   Creatinine, Ser 0.71 0.44 - 1.00 mg/dL   Calcium 8.3 (L) 8.9 - 10.3 mg/dL   GFR, Estimated >60 >60 mL/min   Anion gap 12 5 - 15    Comment: Performed at [facility], [address] [place], [place] [address]  Lipid panel     Status: Abnormal   Collection Time: 03/16/20  5:26 AM  Result Value Ref Range    Cholesterol 234 (H) 0 - 200 mg/dL   Triglycerides 73 <150 mg/dL   HDL 51 >40 mg/dL   Total CHOL/HDL Ratio 4.6 RATIO   VLDL 15 0 - 40 mg/dL   LDL Cholesterol 168 (H) 0 - 99 mg/dL    Comment:        Total Cholesterol/HDL:CHD Risk Coronary Heart Disease Risk Table                     Men   Women  1/2 Average Risk   3.4   3.3  Average Risk       5.0   4.4  2 X Average Risk   9.6   7.1  3 X Average Risk  23.4   11.0        Use the calculated Patient Ratio above and the CHD Risk Table to determine the patient's CHD Risk.        ATP III CLASSIFICATION (LDL):  <100     mg/dL   Optimal  100-129  mg/dL   Near or Above                    Optimal  130-159  mg/dL   Borderline  160-189  mg/dL   High  >190     mg/dL   Very High Performed at [facility], [address] [place], [place] [address]   Magnesium     Status: None   Collection Time: 03/16/20  1:24 PM  Result Value Ref Range   Magnesium 1.7 1.7 - 2.4 mg/dL    Comment: Performed at [facility], [address] [place], [place] [address]    Recent Results (from the past 240 hour(s))  Respiratory Panel by RT PCR (Flu A&B, Covid) - Nasopharyngeal Swab     Status: None   Collection Time: 03/15/20  1:05 PM   Specimen: Nasopharyngeal Swab  Result Value Ref Range Status   SARS Coronavirus 2 by RT PCR NEGATIVE NEGATIVE Final    Comment: (NOTE) SARS-CoV-2 target nucleic acids are NOT DETECTED.  The SARS-CoV-2 RNA is generally detectable in upper respiratoy specimens during the acute phase of infection. The lowest concentration of SARS-CoV-2 viral copies this assay can detect is 131 copies/mL. A negative result does not preclude SARS-Cov-2 infection and should not be used as the sole basis for treatment or other patient management decisions. A negative result may occur with  improper specimen collection/handling, submission of specimen other than nasopharyngeal swab, presence of viral mutation(s) within  the areas targeted by this assay, and inadequate number of viral copies (<131 copies/mL). A negative result must be combined with clinical observations, patient history, and epidemiological information. The expected result is Negative.  Fact Sheet for Patients:  PinkCheek.be  Fact Sheet for Healthcare Providers:  GravelBags.it  This test is no t yet approved or cleared by the [place] FDA and  has been authorized for detection and/or diagnosis of SARS-CoV-2 by FDA under an Emergency Use Authorization (EUA). This EUA will remain  in  effect (meaning this test can be used) for the duration of the COVID-19 declaration under Section 564(b)(1) of the Act, 21 U.S.C. section 360bbb-3(b)(1), unless the authorization is terminated or revoked sooner.     Influenza A by PCR NEGATIVE NEGATIVE Final   Influenza B by PCR NEGATIVE NEGATIVE Final    Comment: (NOTE) The Xpert Xpress SARS-CoV-2/FLU/RSV assay is intended as an aid in  the diagnosis of influenza from Nasopharyngeal swab specimens and  should not be used as a sole basis for treatment. Nasal washings and  aspirates are unacceptable for Xpert Xpress SARS-CoV-2/FLU/RSV  testing.  Fact Sheet for Patients: PinkCheek.be  Fact Sheet for Healthcare Providers: GravelBags.it  This test is not yet approved or cleared by the [place] FDA and  has been authorized for detection and/or diagnosis of SARS-CoV-2 by  FDA under an Emergency Use Authorization (EUA). This EUA will remain  in effect (meaning this test can be used) for the duration of the  Covid-19 declaration under Section 564(b)(1) of the Act, 21  U.S.C. section 360bbb-3(b)(1), unless the authorization is  terminated or revoked. Performed at [facility], [address] [place], [place] [address]   Culture, blood (routine x 2)     Status: None  (Preliminary result)   Collection Time: 03/15/20  1:07 PM   Specimen: BLOOD  Result Value Ref Range Status   Specimen Description BLOOD RIGHT ARM  Final   Special Requests   Final    BOTTLES DRAWN AEROBIC AND ANAEROBIC Blood Culture adequate volume   Culture   Final    NO GROWTH 2 DAYS Performed at [facility], [address], [place], [place] [address]    Report Status PENDING  Incomplete  Culture, blood (routine x 2)     Status: None (Preliminary result)   Collection Time: 03/15/20  2:34 PM   Specimen: BLOOD  Result Value Ref Range Status   Specimen Description BLOOD BLOOD RIGHT HAND  Final   Special Requests   Final    BOTTLES DRAWN AEROBIC AND ANAEROBIC Blood Culture results may not be optimal due to an inadequate volume of blood received in culture bottles   Culture   Final    NO GROWTH 2 DAYS Performed at [facility], [address], [place], [place] [address]    Report Status PENDING  Incomplete    Lipid Panel Recent Labs    03/16/20 0526  CHOL 234*  TRIG 73  HDL 51  CHOLHDL 4.6  VLDL 15  LDLCALC 168*    Studies/Results: CT Head Wo Contrast  Result Date: 03/15/2020 CLINICAL DATA:  Mental status change EXAM: CT HEAD WITHOUT CONTRAST TECHNIQUE: Contiguous axial images were obtained from the base of the skull through the vertex without intravenous contrast. COMPARISON:  12/07/2019 head CT and prior.  10/29/2019 MRI head. FINDINGS: Brain: No intracranial hemorrhage. Focal left corona radiata hypodensity is new (2:20). Sequela of remote right frontal and right occipitotemporal insults. Mild cerebral atrophy with ex vacuo dilatation. Scattered and confluent hypodense foci involving the periventricular and subcortical white matter are nonspecific however commonly associated with chronic microvascular ischemic changes. No mass lesion. No midline shift, ventriculomegaly or extra-axial fluid collection. Vascular: No hyperdense vessel or unexpected  calcification. Skull: No acute finding. Sinuses/Orbits: Normal orbits. Layering right maxillary and sphenoid secretions. No mastoid effusion. Other: None. IMPRESSION: Left corona radiata hypodensity concerning for acute/subacute insult. Remote right frontal and right occipitotemporal insults. Mild cerebral atrophy and advanced chronic microvascular ischemic changes. Electronically  Signed   By: Primitivo Gauze M.D.   On: 03/15/2020 14:44   MR ANGIO HEAD WO CONTRAST  Result Date: 03/16/2020 CLINICAL DATA:  86 year old female with evidence of late subacute infarct of the left corona radiata on MRI yesterday. EXAM: MRA HEAD WITHOUT CONTRAST TECHNIQUE: Angiographic images of the Circle of Willis were obtained using MRA technique without intravenous contrast. COMPARISON:  Brain MRI 03/15/2020.  CTA head and neck 10/28/2019. FINDINGS: Mildly degraded by motion artifact at the level of the vertebrobasilar junction, petrous ICAs. Antegrade flow in the posterior circulation. Stable distal vertebral arteries, the left mildly dominant. Patent vertebrobasilar junction and basilar artery. No vertebrobasilar stenosis. Patent SCA and PCA origins. Multifocal moderate bilateral P1 and P2 segment stenoses. Bilateral PCA branches appear stable since the May CTA. Antegrade flow in both ICA siphons. Both siphons appear stable since May with mild cavernous segment ectasia and no significant stenosis. Patent carotid termini. Severe bilateral MCA origin stenoses, and additional tandem severe right M1 stenoses (series 1024, image 9). The most distal right M1 stenosis is new since May. The MCA bifurcations remain patent. Grossly stable visible MCA branches. Left ACA A1 segment remains within normal limits. Moderate stenosis right A1 appears stable since May. Severe multifocal bilateral ACA A2 stenoses appear stable since May. IMPRESSION: 1. Negative for large vessel occlusion. 2. Positive for severe intracranial atherosclerosis,  notable for: - tandem Severe Right MCA M1 stenoses, including a new Severe distal right M1 stenosis since the CTA 10/28/2019. - Severe Left MCA origin stenosis, stable. - Moderate right A1 and multifocal Severe bilateral A2 segment stenoses, stable. - Moderate multifocal bilateral PCA stenoses, stable. Electronically Signed   By: Genevie Ann M.D.   On: 03/16/2020 23:40   MR BRAIN WO CONTRAST  Result Date: 03/15/2020 CLINICAL DATA:  86 year old female with altered mental status. History of prior stroke. EXAM: MRI HEAD WITHOUT CONTRAST TECHNIQUE: Multiplanar, multiecho pulse sequences of the brain and surrounding structures were obtained without intravenous contrast. COMPARISON:  Head CT earlier today.  Brain MRI 10/29/2019. FINDINGS: Brain: Subtle increased trace diffusion signal in the left corona radiata and centrum semiovale where new confluent white matter T2 and FLAIR hyperintensity has developed since June (series 12, image 18). However, this is not clearly restricted on ADC. No associated hemorrhage or mass effect at that site. No diffusion restriction elsewhere. Stable multifocal cortical and subcortical encephalomalacia including in the superior right MCA and right MCA/PCA watershed areas. Underlying confluent bilateral cerebral white matter T2 and FLAIR hyperintensity is stable outside of the left superior frontal lobe. Chronic microhemorrhage in the right periatrial white matter is stable. A microhemorrhage is visible in the midline dorsal pons today, that area was obscured previously on SWI. Patchy T2 heterogeneity in the pons is stable. Interestingly, the deep gray nuclei and cerebellum remain within normal limits. No midline shift, mass effect, evidence of mass lesion, ventriculomegaly, extra-axial collection or acute intracranial hemorrhage. Cervicomedullary junction and pituitary are within normal limits. Vascular: Major intracranial vascular flow voids appear stable since June. Skull and upper  cervical spine: Stable visible cervical spine, chronic disc disease at C2-C3. Normal bone marrow signal. Sinuses/Orbits: Stable and negative orbits. Chronic right sphenoid and maxillary sinus mucosal thickening. Other: Mastoids remain clear. Grossly normal visible internal auditory structures. Scalp and face appear negative. IMPRESSION: 1. Late subacute white matter infarct in the left corona radiata/centrum semiovale. No associated hemorrhage or mass effect. 2. Elsewhere stable ischemic disease since the June MRI. No acute intracranial abnormality. Electronically Signed  By: Genevie Ann M.D.   On: 03/15/2020 21:30   US Carotid Bilateral (at [facility] and [facility] only)  Result Date: 03/16/2020 CLINICAL DATA:  Hypertension, stroke, hyperlipidemia EXAM: BILATERAL CAROTID DUPLEX ULTRASOUND TECHNIQUE: Pearline Cables scale imaging, color Doppler and duplex ultrasound were performed of bilateral carotid and vertebral arteries in the neck. COMPARISON:  None. FINDINGS: Criteria: Quantification of carotid stenosis is based on velocity parameters that correlate the residual internal carotid diameter with NASCET-based stenosis levels, using the diameter of the distal internal carotid lumen as the denominator for stenosis measurement. The following velocity measurements were obtained: RIGHT ICA: 64/23 cm/sec CCA: 30/16 cm/sec SYSTOLIC ICA/CCA RATIO:  1.6 ECA: 88 cm/sec LEFT ICA: 59/26 cm/sec CCA: 01/09 cm/sec SYSTOLIC ICA/CCA RATIO:  1.4 ECA: 60 cm/sec RIGHT CAROTID ARTERY: Mild to moderate echogenic shadowing plaque formation. No hemodynamically significant right ICA stenosis, velocity elevation, or turbulent flow. Degree of narrowing less than 50%. RIGHT VERTEBRAL ARTERY:  Normal antegrade flow LEFT CAROTID ARTERY: Similar scattered mild echogenic plaque formation. No hemodynamically significant left ICA stenosis, velocity elevation, or turbulent flow. LEFT VERTEBRAL ARTERY:  Normal antegrade flow IMPRESSION: Bilateral carotid atherosclerosis.  No hemodynamically significant ICA stenosis. Degree of narrowing less than 50% bilaterally by ultrasound criteria. Patent antegrade vertebral flow bilaterally Electronically Signed   By: Jerilynn Mages.  Shick M.D.   On: 03/16/2020 08:52   DG Chest Portable 1 View  Result Date: 03/15/2020 CLINICAL DATA:  Altered mental status EXAM: PORTABLE CHEST 1 VIEW COMPARISON:  December 07, 2019 FINDINGS: There is no edema or airspace opacity. Heart is mildly enlarged with pulmonary vascularity normal. No adenopathy. There is aortic atherosclerosis. No evident pneumothorax. No bone lesions. IMPRESSION: Stable cardiac prominence.  No edema or airspace opacity. Aortic Atherosclerosis (ICD10-I70.0). Electronically Signed   By: Lowella Grip III M.D.   On: 03/15/2020 14:02   ECHOCARDIOGRAM COMPLETE  Result Date: 03/16/2020    ECHOCARDIOGRAM REPORT   Patient Name:   [facility] Date of Exam: 03/16/2020 Medical Rec #:  [id number]               Height:       66.0 in Accession #:    [id number]              Weight:       119.0 lb Date of Birth:  02/23/1919               BSA:          1.604 m Patient Age:    71 years                BP:           153/110 mmHg Patient Gender: F                       HR:           70 bpm. Exam Location:  [facility] Procedure: 2D Echo, Color Doppler and Cardiac Doppler Indications:     I163.9 Stroke  History:         Patient has prior history of Echocardiogram examinations, most                  recent 10/29/2019. Arrythmias:Atrial Fibrillation.  Sonographer:     Charmayne Sheer RDCS (AE) Referring Phys:  [place] Diagnosing Phys: Isaias Cowman MD  Sonographer Comments: Suboptimal parasternal window. IMPRESSIONS  1. Left ventricular ejection fraction, by estimation, is 60 to 65%. The  left ventricle has normal function. The left ventricle has no regional wall motion abnormalities. Left ventricular diastolic parameters are consistent with Grade I diastolic dysfunction (impaired relaxation).  2. Right  ventricular systolic function is normal. The right ventricular size is normal.  3. The mitral valve is normal in structure. Mild to moderate mitral valve regurgitation. No evidence of mitral stenosis.  4. Tricuspid valve regurgitation is moderate.  5. The aortic valve is normal in structure. Aortic valve regurgitation is mild. No aortic stenosis is present.  6. The inferior vena cava is normal in size with greater than 50% respiratory variability, suggesting right atrial pressure of 3 mmHg. FINDINGS  Left Ventricle: Left ventricular ejection fraction, by estimation, is 60 to 65%. The left ventricle has normal function. The left ventricle has no regional wall motion abnormalities. The left ventricular internal cavity size was normal in size. There is  no left ventricular hypertrophy. Left ventricular diastolic parameters are consistent with Grade I diastolic dysfunction (impaired relaxation). Right Ventricle: The right ventricular size is normal. No increase in right ventricular wall thickness. Right ventricular systolic function is normal. Left Atrium: Left atrial size was normal in size. Right Atrium: Right atrial size was normal in size. Pericardium: There is no evidence of pericardial effusion. Mitral Valve: The mitral valve is normal in structure. Mild to moderate mitral valve regurgitation. No evidence of mitral valve stenosis. MV peak gradient, 10.5 mmHg. The mean mitral valve gradient is 4.0 mmHg. Tricuspid Valve: The tricuspid valve is normal in structure. Tricuspid valve regurgitation is moderate . No evidence of tricuspid stenosis. Aortic Valve: The aortic valve is normal in structure. Aortic valve regurgitation is mild. No aortic stenosis is present. Aortic valve mean gradient measures 6.0 mmHg. Aortic valve peak gradient measures 13.5 mmHg. Aortic valve area, by VTI measures 3.05  cm. Pulmonic Valve: The pulmonic valve was normal in structure. Pulmonic valve regurgitation is not visualized. No evidence  of pulmonic stenosis. Aorta: The aortic root is normal in size and structure. Venous: The inferior vena cava is normal in size with greater than 50% respiratory variability, suggesting right atrial pressure of 3 mmHg. IAS/Shunts: No atrial level shunt detected by color flow Doppler.  LEFT VENTRICLE PLAX 2D LVIDd:         3.52 cm  Diastology LVIDs:         2.18 cm  LV e' medial:    4.46 cm/s LV PW:         1.06 cm  LV E/e' medial:  29.4 LV IVS:        0.79 cm  LV e' lateral:   4.35 cm/s LVOT diam:     2.30 cm  LV E/e' lateral: 30.1 LV SV:         104 LV SV Index:   65 LVOT Area:     4.15 cm  RIGHT VENTRICLE RV Basal diam:  3.13 cm LEFT ATRIUM             Index       RIGHT ATRIUM           Index LA diam:        3.00 cm 1.87 cm/m  RA Area:     19.60 cm LA Vol (A2C):   61.8 ml 38.53 ml/m RA Volume:   65.20 ml  40.65 ml/m LA Vol (A4C):   42.8 ml 26.68 ml/m LA Biplane Vol: 51.8 ml 32.29 ml/m  AORTIC VALVE  PULMONIC VALVE AV Area (Vmax):    2.85 cm     PV Vmax:       1.49 m/s AV Area (Vmean):   2.94 cm     PV Vmean:      91.200 cm/s AV Area (VTI):     3.05 cm     PV VTI:        0.284 m AV Vmax:           184.00 cm/s  PV Peak grad:  8.9 mmHg AV Vmean:          116.000 cm/s PV Mean grad:  4.0 mmHg AV VTI:            0.342 m AV Peak Grad:      13.5 mmHg AV Mean Grad:      6.0 mmHg LVOT Vmax:         126.00 cm/s LVOT Vmean:        82.100 cm/s LVOT VTI:          0.251 m LVOT/AV VTI ratio: 0.73  AORTA Ao Root diam: 3.30 cm MITRAL VALVE                TRICUSPID VALVE MV Area (PHT): 2.44 cm     TR Peak grad:   38.2 mmHg MV Peak grad:  10.5 mmHg    TR Vmax:        309.00 cm/s MV Mean grad:  4.0 mmHg MV Vmax:       1.62 m/s     SHUNTS MV Vmean:      99.8 cm/s    Systemic VTI:  0.25 m MV Decel Time: 311 msec     Systemic Diam: 2.30 cm MV E velocity: 131.00 cm/s MV A velocity: 158.00 cm/s MV E/A ratio:  0.83 Isaias Cowman MD Electronically signed by Isaias Cowman MD Signature Date/Time:  03/16/2020/1:40:19 PM    Final     Medications:  Scheduled: .  stroke: mapping our early stages of recovery book   Does not apply Once  . aspirin EC  81 mg Oral Daily  . clopidogrel  75 mg Oral Daily  . heparin  5,000 Units Subcutaneous Q8H  . latanoprost  1 drop Both Eyes QHS  . mirtazapine  7.5 mg Oral QHS  . PARoxetine  20 mg Oral Daily  . rosuvastatin  20 mg Oral Daily   Continuous: . cefTRIAXone (ROCEPHIN)  IV    . sodium chloride 0.45 % with kcl Stopped (03/16/20 1438)   MRA head: Positive for severe intracranial atherosclerosis, notable for: - tandem Severe Right MCA M1 stenoses, including a new Severe distal right M1 stenosis since the CTA 10/28/2019. - Severe Left MCA origin stenosis, stable. - Moderate right A1 and multifocal severe bilateral A2 segment stenoses, stable. - Moderate multifocal bilateral PCA stenoses, stable.  TTE: 1. Left ventricular ejection fraction, by estimation, is 60 to 65%. The  left ventricle has normal function. The left ventricle has no regional  wall motion abnormalities. Left ventricular diastolic parameters are  consistent with Grade I diastolic dysfunction (impaired relaxation).  2. Right ventricular systolic function is normal. The right ventricular  size is normal.  3. The mitral valve is normal in structure. Mild to moderate mitral valve  regurgitation. No evidence of mitral stenosis.  4. Tricuspid valve regurgitation is moderate.  5. The aortic valve is normal in structure. Aortic valve regurgitation is  mild. No aortic stenosis is present.  6. The inferior vena cava  is normal in size with greater than 50%  respiratory variability, suggesting right atrial pressure of 3 mmHg.   Assessment: 86 y.o. female presenting with confusion after an episode of unresponsiveness 1. 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 Today's exam reveals the patient to be more awake and alert. Her dysarthria, which was most  likely due to dry oral mucosa, is now resolved. Spastic left hemiparesis is unchanged.  2. CT head and MRI brain: CT head revealed a left corona radiata hypodensity initially concerning for an acute/subacute insult, which was revealed to be a late-subacute versus early chronic small vessel ischemic white matter infarction on subsequent MRI performed Sunday evening. Also noted on imaging were remote right frontal and right occipitotemporal strokes, mild cerebral atrophy and advanced chronic microvascular ischemic changes.  3. Carotid ultrasound:  Bilateral carotid atherosclerosis. No hemodynamically significant ICA stenosis. Degree of narrowing less than 50% bilaterally by ultrasound criteria.Stroke Risk Factors - HTN, HLD, stroke with residual left sided deficits and chronic atrial fibrillation 4. MRA reveals severe intracranial atherosclerotic disease.   5. TTE report does not document any mural thrombus or valvular vegetation.  6. Overall impression is that her presentation was most likely due to a transient hypotensive episode rather than an acute stroke. The MRI findings are most c/w a late subacute or early chronic ischemic infarction in the left corona radiata. Also on DDx would be a toxic, infectious or metabolic etiology.   Recommendations: 1. Telemetry monitoring 2. PT/OT. Goals for SNF should include OOB to chair daily.  3. Encourage PO hydration.  4. BP management with SBP goal of 120-140 5. Continue DAPT. She is classifiable as having failed Plavix monotherapy. Also, her severe intracranial atherosclerosis is an indication for DAPT.  6. Given her chronic atrial fibrillation, anticoagulation would provide the most risk reduction for stroke. She is bed/chair bound and hence at low risk for falls. Would need to discuss risks/benefits further with family regarding stroke/MI reduction with anticoagulation versus antiplatelet therapy, in the context of increased bleeding risk with the former.  Given her advanced age and decreased life expectancy, the best option may be antiplatelet therapy.   7. Neurohospitalist service will sign off. Please call if there are additional questions.    LOS: 2 days   @Electronically  signed: Dr. Kerney Elbe 03/17/2020  8:12 AM

## 2020-03-17 NOTE — Evaluation (Signed)
Occupational Therapy Evaluation Patient Details Name: Christy Waters MRN: [id number] DOB: 12-03-1918 Today's Date: 03/17/2020    History of Present Illness 86 y.o. female with a history of right ACA, MCA CVA with residual left hemiparesis, HLD, PAF no longer on eliquis who presented from [facility] 10/17 for confusion and question of gaze deviation as well as reported soft blood pressures. CT demonstrated acute vs. subacute CVA in the corona radiata. Urinalysis suggested UTI   Clinical Impression   Pt was seen for OT evaluation this date. No family present. Pt alert and pleasant, oriented to self, place, month, and states she's here for her feet. Confusion about situation and home/PLOF appears to waiver during session. Pt reports living with her son and independent at baseline. Question whether this is true. Pt lives at [facility]; unclear what level of care. Pt presents with impaired cognition, generalized weakness, significant bilateral toes pain, L hand contracture (fingernails putting pressure on skin with redness noted and increased moisture in fisted hand). Pt able to follow simple commands with cues. Pt at risk for additional skin breakdown. Folded pillowcase gently applied to L hand with discomfort noted with attempts to very slightly increase finger extension for the pillowcase. Once placed, pt denied discomfort. RN notified to support carryover and continued maintenance of hand contracture. Will trial skilled OT services while pt is hospitalized with emphasis on maintenance and minimize increased caregiver burden, and readmission.    Follow Up Recommendations  Supervision/Assistance - 24 hour;Other (comment) (maintenance care for contracture)    Equipment Recommendations  None recommended by OT    Recommendations for Other Services       Precautions / Restrictions Precautions Precautions: Fall Precaution Comments: L hand contracture      Mobility Bed Mobility                   Transfers                      Balance                                           ADL either performed or assessed with clinical judgement   ADL                                         General ADL Comments: Pt cognition limiting full ADL assessment 2/2 decreased safety; Pt able to hold cup with set up to drink ensure from straw on tray, set up for light grooming tasks using RUE. Anticipate at least Min A for UB ADL, Mod A for LB ADL.     Vision   Vision Assessment?: No apparent visual deficits Additional Comments: Pt visually tracks therapist around room, no apparent visual deficits, limited assessment 2/2 cognition     Perception     Praxis      Pertinent Vitals/Pain Pain Assessment: 0-10 Pain Location: Pt reports "a lot" of pain in bilat feet - mostly toes Pain Descriptors / Indicators: Aching Pain Intervention(s): Limited activity within patient's tolerance;Monitored during session;Repositioned     Hand Dominance Right   Extremity/Trunk Assessment Upper Extremity Assessment Upper Extremity Assessment: Generalized weakness;LUE deficits/detail LUE Deficits / Details: L hand contracture with very limited wrist and finger AROM   Lower Extremity Assessment  Lower Extremity Assessment: Generalized weakness;Defer to PT evaluation       Communication Communication Communication: No difficulties   Cognition Arousal/Alertness: Awake/alert Behavior During Therapy: WFL for tasks assessed/performed Overall Cognitive Status: No family/caregiver present to determine baseline cognitive functioning                                 General Comments: Pt alert and oriented to name, birth date, month, and states she's here 2/2 "my feet". Unsure of year. Knows she is at [facility]. Follows simple commands well. Confusion appears to be intermittent   General Comments  Skin noted to be red and moist in contracted L hand. Effort  made to place folded pillow case in hand to minimize moisture and prevent fingernails from piercing skin. RN notified.    Exercises     Shoulder Instructions      Home Living Family/patient expects to be discharged to:: Skilled nursing facility                                 Additional Comments: pt unable to verbalize assist; per chart review, pt has 24/7 support from caregivers      Prior Functioning/Environment          Comments: No family present, pt reports living at home with her son and independent with ADL/IADL tasks. Per chart review, pt appears to have been independent, using rollator for community distances approx 6mo ago. Unable to verify.        OT Problem List: Decreased strength;Decreased coordination;Decreased range of motion;Pain;Decreased cognition;Impaired UE functional use;Decreased activity tolerance;Decreased safety awareness;Decreased knowledge of use of DME or AE;Impaired balance (sitting and/or standing)      OT Treatment/Interventions: Self-care/ADL training;Therapeutic activities;DME and/or AE instruction;Patient/family education;Splinting    OT Goals(Current goals can be found in the care plan section) Acute Rehab OT Goals Patient Stated Goal: pt wants to see her son OT Goal Formulation: With patient Time For Goal Achievement: 03/31/20 Potential to Achieve Goals: Fair ADL Goals Additional ADL Goal #1: Pt will perform bed mobility with Mod A and verbal cues for sequencing to support bed level ADL tasks and skin protection. Additional ADL Goal #2: Pt will tolerate skin/joint integrity interventions with Min VC to maintain for L hand contracture to minimize skin breakdown.  OT Frequency: Min 1X/week (3 session trial)   Barriers to D/C:            Co-evaluation              AM-PAC OT "6 Clicks" Daily Activity     Outcome Measure Help from another person eating meals?: A Little Help from another person taking care of personal  grooming?: A Little Help from another person toileting, which includes using toliet, bedpan, or urinal?: A Lot Help from another person bathing (including washing, rinsing, drying)?: A Lot Help from another person to put on and taking off regular upper body clothing?: A Little Help from another person to put on and taking off regular lower body clothing?: A Lot 6 Click Score: 15   End of Session Nurse Communication: Other (comment) (pillowcase in contracted L hand)  Activity Tolerance: Patient tolerated treatment well Patient left: in bed;with call bell/phone within reach;with bed alarm set  OT Visit Diagnosis: Other abnormalities of gait and mobility (R26.89);Other symptoms and signs involving cognitive function;Muscle weakness (generalized) (M62.81);Hemiplegia and hemiparesis;Pain  Hemiplegia - Right/Left: Left Hemiplegia - dominant/non-dominant: Non-Dominant Hemiplegia - caused by: Cerebral infarction Pain - Right/Left: Right (both) Pain - part of body: Ankle and joints of foot                Time: 1434-1449 OT Time Calculation (min): 15 min Charges:  OT General Charges $OT Visit: 1 Visit OT Evaluation $OT Eval Moderate Complexity: 1 Mod  Jeni Salles, MPH, MS, OTR/L ascom [phone] 03/17/20, 3:31 PM

## 2020-03-17 NOTE — Evaluation (Signed)
Physical Therapy Evaluation Patient Details Name: Christy Waters MRN: [id number] DOB: [birth year] Today's Date: 03/17/2020   History of Present Illness  86 y.o. female with a history of right ACA, MCA CVA with residual left hemiparesis, HLD, PAF no longer on eliquis who presented from [facility] 10/17 for confusion and question of gaze deviation as well as reported soft blood pressures. CT demonstrated acute vs. subacute CVA in the corona radiata. Urinalysis suggested UTI  Clinical Impression  Pt received lying in bed with daughter Jan present. Jan reports that she needs to leave at this time, said good wishings to the patient, told her that her sister would be in tomorrow, then left. Pt agreeable to PT evaluation. Pt remained pleasantly confused throughout session correctly stating her DOB, name, and location, however incorrectly stating the year as 1924 or 1910. Pt also a poor historian regarding PLOF reporting that she was independent with transfers and ambulation prior to admission with chart review stating that she is primarily bed bound. Pt with notable grasped L hand with tone from previous CVA with increased tone in L foot as it is positioned in plantarflexion with slight inversion. Pt with decreased motor control on L side and is able to lift against gravity however is jerky and with decreased range from R side. Pt following most single commands and presents with difficulty processing and following multistep commands. Pt performed rolling to L and R requiring max A for positioning and transitioning to sidelying position for soiled pad removal. +2 assist to slide to [facility] via drawsheet. Pt presents with deficits in functional activity tolerance,strength, motor control, cognition, pain, and functional mobility. Anticipate pt will require max to max+2 A for sitting and standing activities. Will trial pt on 2-3 sessions of skilled PT to assess for carryover and participation. Recommend HHPT for  pt/family education for positioning and prevention of skin breakdown, and exercises to prevent muscle wasting.    Follow Up Recommendations Supervision - Intermittent;Other (comment) (return to long term care with HHPT)    Equipment Recommendations  None recommended by PT    Recommendations for Other Services       Precautions / Restrictions Precautions Precautions: Fall Precaution Comments: L hand contracture; fixed L foot drop in plantarflexion Restrictions Weight Bearing Restrictions: No      Mobility  Bed Mobility Overal bed mobility: Needs Assistance Bed Mobility: Rolling Rolling: Max assist         General bed mobility comments: Max A for rolling to L and R sides: verbal cues for reaching with RUE across body to bedrail and required manual positioning of RLE to assist with rolling  Transfers                 General transfer comment: not attempted  Ambulation/Gait             General Gait Details: not attempted  Stairs            Wheelchair Mobility    Modified Rankin (Stroke Patients Only)       Balance Overall balance assessment: Needs assistance     Sitting balance - Comments: not performed       Standing balance comment: not performed                             Pertinent Vitals/Pain Pain Assessment: Faces Faces Pain Scale: Hurts even more Pain Location: Pt reporting pain in L foot, specifically in L great  toe and requesting clinician to remove the shoe on that foot because it feels like she is getting an ulcer. Of note, pt is barefoot. Pain Descriptors / Indicators: Aching;Discomfort;Sore Pain Intervention(s): Monitored during session    Home Living Family/patient expects to be discharged to:: Skilled nursing facility                 Additional Comments: pt unable to verbalize assist; per chart review, pt has 24/7 support from caregivers    Prior Function           Comments: No family present,  pt reports living at home with her son and independent with ADL/IADL tasks. Per chart review, pt appears to have been independent, using rollator for community distances approx 56mo ago. Unable to verify. Pt reports that she is currently independent with transfers and ambulation without AD.     Hand Dominance   Dominant Hand: Right    Extremity/Trunk Assessment   Upper Extremity Assessment Upper Extremity Assessment: Generalized weakness;Defer to OT evaluation LUE Deficits / Details: L hand contracture with very limited wrist and finger AROM    Lower Extremity Assessment Lower Extremity Assessment: Generalized weakness;LLE deficits/detail (grossly 3- to 3+/5 RLE) LLE Deficits / Details: L foot noted to be dropped into plantarflexion with a bit of inversion; noted able to wiggle great toe slightly with no ankle movement; pt able to actively flex hip and knee with jerky movements LLE Coordination: decreased gross motor;decreased fine motor       Communication   Communication: No difficulties  Cognition Arousal/Alertness: Awake/alert Behavior During Therapy: WFL for tasks assessed/performed Overall Cognitive Status: No family/caregiver present to determine baseline cognitive functioning                                 General Comments: Pt able to state name and DOB, beleives she had a stroke to get admitted and states that this is October 1954 then said "no, 1910".      General Comments General comments (skin integrity, edema, etc.): pt with red/pink markings on bilateral legs     Exercises     Assessment/Plan    PT Assessment Patient needs continued PT services  PT Problem List Decreased strength;Decreased range of motion;Decreased activity tolerance;Decreased balance;Decreased mobility;Decreased coordination;Decreased cognition;Decreased safety awareness;Impaired tone;Pain       PT Treatment Interventions Functional mobility training;Therapeutic  activities;Therapeutic exercise;Balance training;Neuromuscular re-education;Cognitive remediation;Patient/family education    PT Goals (Current goals can be found in the Care Plan section)  Acute Rehab PT Goals Patient Stated Goal: none stated PT Goal Formulation: Patient unable to participate in goal setting Time For Goal Achievement: 03/31/20 Potential to Achieve Goals: Poor Additional Goals Additional Goal #1: Pt will perform rolling side to side with no more than min A. (for pressure relief, repositioning, and maximize functional mobility)    Frequency Other (Comment) (trial of 2-3 sessions)   Barriers to discharge        Co-evaluation               AM-PAC PT "6 Clicks" Mobility  Outcome Measure Help needed turning from your back to your side while in a flat bed without using bedrails?: A Lot Help needed moving from lying on your back to sitting on the side of a flat bed without using bedrails?: Total Help needed moving to and from a bed to a chair (including a wheelchair)?: Total Help needed standing up from  a chair using your arms (e.g., wheelchair or bedside chair)?: Total Help needed to walk in hospital room?: Total Help needed climbing 3-5 steps with a railing? : Total 6 Click Score: 7    End of Session   Activity Tolerance: Patient limited by fatigue;Patient tolerated treatment well Patient left: in bed;with call bell/phone within reach;with bed alarm set Nurse Communication: Mobility status PT Visit Diagnosis: Unsteadiness on feet (R26.81);Other abnormalities of gait and mobility (R26.89);Muscle weakness (generalized) (M62.81);Difficulty in walking, not elsewhere classified (R26.2);Hemiplegia and hemiparesis;Pain Hemiplegia - Right/Left: Left Hemiplegia - dominant/non-dominant: Non-dominant Hemiplegia - caused by: Cerebral infarction Pain - Right/Left: Left Pain - part of body: Ankle and joints of foot    Time: 3419-6222 PT Time Calculation (min) (ACUTE  ONLY): 25 min   Charges:   PT Evaluation $PT Eval Low Complexity: 1 Low          Vale Haven, SPT  Jaylend Reiland 03/17/2020, 5:13 PM

## 2020-03-17 NOTE — Progress Notes (Signed)
PROGRESS NOTE  Christy Waters  WYO:[id number] DOB: Aug 20, 1918 DOA: 03/15/2020 PCP: Rusty Aus, MD   Brief Narrative: Christy Waters is a 86 y.o. female with a history of right ACA, MCA CVA with residual left hemiparesis, HLD, PAF no longer on eliquis who presented from [facility] 10/17 for confusion and question of gaze deviation as well as reported soft blood pressures. CT demonstrated acute vs. subacute CVA in the corona radiata. Urinalysis suggested UTI, WBC 6.8k though lactic acid was 3.6 for which ceftriaxone and 1L LR were given and cultures drawn. Hospitalists called to admit for UTI and CVA. She receives palliative care services per her daughter and is DNR but does not wish for restrictions in care at this time. MRI revealed area in question to be late subacute-to-chronic small vessel infarction in addition to remote right frontal and right occipitotemporal strokes, mild cerebral atrophy, and advanced chronic microvascular ischemic changes.  Neurology was consulted, recommending DAPT, MRA brain in addition to work up underway.   Assessment & Plan: Active Problems:   Atherosclerosis of aorta (HCC)   HLD (hyperlipidemia)   Moderate episode of recurrent major depressive disorder (HCC)   Acute lower UTI   Acute CVA (cerebrovascular accident) ([place])   PAF (paroxysmal atrial fibrillation) ([place])  Acute metabolic encephalopathy: Suspected primarily due to UTI.  - Improving, will continue treatment as below.   UTI: Urinalysis revealing few bacteria, >50 WBCs/HPF but evidence of nonclean catch. Lactic acid has cleared. - Continue ceftriaxone (has tolerated this despite PCN allergy thus far)  - Follow urine and blood cultures. Urine culture wasn't sent, so add-on requested and is still pending.  - Discussed the patient's innate risk factors for UTIs including age, immobility, incontinence with her daughter. Urged continue hydration.   Late subacute corona radiata infarct, history  of cerebrovascular disease and CVA w/left hemiparesis:  - Neurology consult appreciated. MRA revealed widespread severe intracranial stenoses. Will await further neurology recommendations. Carotid atherosclerosis noted with stenosis <50%.  - LDL remains elevated at 168. I discussed risks and benefits of statin therapy/augmentation. We have elected to discontinue statin therapy at this time.  - DAPT ordered per neurology. With AFib, stroke risk reduction would be greatest with anticoagulation though this appears to have been discussed and not continued based on goals of care.  AKI: Suspected AKI with Cr 0.94 w/such advanced age and sarcopenia as well as hyaline casts on UA. SCr improved with IVF again today. Hypernatremia durably resolved.  - DC IVF, monitor on po hydration alone. - Avoid nephrotoxins as able.   Left hemiparesis with left hand contracture:  - Needs fingernails cut - Keep hand dry, will add topical nystatin powder to reduce risk of fungal infection.  Hypokalemia:  - Supplement by mouth if able per RN. Mg wnl. - Push po, ok to give diet per SLP.   HLD: LDL remains elevated. Based on goals of care discussions, the patient is under hospice care and will not continue statin at this time.  PAF: Currently appears to be in sinus with frequent PVCs. Can DC telemetry. - Not on anticoagulation per report due to falls.  Depression:  - Continue SSRI, amitriptyline   HTN:  - Permissive HTN for now.  Immunized against covid-19 with [employer].  - Urged to get booster vaccine.  DVT prophylaxis: Heparin  Code Status: DNR Family Communication: Daughter at bedside this morning. Discussed goals of care at length with hospice liaison. There is clear reluctance to convert to comfort measures  only, and to accept that PT/OT will be unlikely to improve her current condition. Disposition Plan:  Status is: Inpatient  Remains inpatient appropriate because:Ongoing diagnostic testing  needed not appropriate for outpatient work up and Inpatient level of care appropriate due to severity of illness.   Dispo: The patient is from: SNF              Anticipated d/c is to: SNF with hospice.              Anticipated d/c date is: 03/18/2020              Patient currently is not medically stable to d/c.  Consultants:   Neurology  Procedures:   None  Antimicrobials:  Ceftriaxone   Subjective: RN reports difficulty with pills. Daughter at bedside feeding breakfast. No new complaints by patient. Her oral intake is improving. Labs not drawn this morning for unclear reasons. Daughter at bedside desires ongoing PT and OT at the facility.    Objective: Vitals:   03/16/20 1942 03/17/20 0055 03/17/20 0742 03/17/20 1035  BP: 123/81 (!) 158/84 (!) 190/90 (!) 181/90  Pulse: 84 71 65 72  Resp: 17 17 20 18   Temp: 98 F (36.7 C) 98.2 F (36.8 C) 98.2 F (36.8 C)   TempSrc:   Oral   SpO2: 97% 96% 96% 98%  Weight:    50.8 kg    Intake/Output Summary (Last 24 hours) at 03/17/2020 1254 Last data filed at 03/16/2020 1702 Gross per 24 hour  Intake 833.7 ml  Output --  Net 833.7 ml   Filed Weights   03/17/20 1035  Weight: 50.8 kg   Gen: Very frail elderly female in no distress Pulm: Nonlabored breathing room air. Clear. CV: Regular. No JVD, no dependent edema. GI: Abdomen soft, non-tender, non-distended, with normoactive bowel sounds.  Ext: Warm, left contractures noted. Left hand moist, clenched. Skin: No rashes, lesions or ulcers on visualized skin. Neuro: Alert and oriented. No new focal neurological deficits. Psych: Judgement and insight appear fair. Mood euthymic & affect congruent. Behavior is appropriate.    Data Reviewed: I have personally reviewed following labs and imaging studies  CBC: Recent Labs  Lab 03/15/20 1305  WBC 6.8  NEUTROABS 5.7  HGB 9.7*  HCT 29.4*  MCV 93.3  PLT 867   Basic Metabolic Panel: Recent Labs  Lab 03/15/20 1305  03/16/20 0526 03/16/20 1324 03/17/20 1120  NA 141 146*  --  141  K 3.4* 2.5*  --  3.2*  CL 103 109  --  107  CO2 25 25  --  25  GLUCOSE 137* 81  --  92  BUN 12 13  --  11  CREATININE 0.94 0.71  --  0.62  CALCIUM 9.5 8.3*  --  8.8*  MG  --   --  1.7 2.0   GFR: Estimated Creatinine Clearance: 30.7 mL/min (by C-G formula based on SCr of 0.62 mg/dL). Liver Function Tests: Recent Labs  Lab 03/15/20 1305  AST 32  ALT 12  ALKPHOS 64  BILITOT 1.0  PROT 6.6  ALBUMIN 3.6   No results for input(s): LIPASE, AMYLASE in the last 168 hours. No results for input(s): AMMONIA in the last 168 hours. Coagulation Profile: No results for input(s): INR, PROTIME in the last 168 hours. Cardiac Enzymes: No results for input(s): CKTOTAL, CKMB, CKMBINDEX, TROPONINI in the last 168 hours. BNP (last 3 results) No results for input(s): PROBNP in the last 8760 hours. HbA1C: Recent  Labs    03/16/20 0526  HGBA1C 4.9   CBG: No results for input(s): GLUCAP in the last 168 hours. Lipid Profile: Recent Labs    03/16/20 0526  CHOL 234*  HDL 51  LDLCALC 168*  TRIG 73  CHOLHDL 4.6   Thyroid Function Tests: No results for input(s): TSH, T4TOTAL, FREET4, T3FREE, THYROIDAB in the last 72 hours. Anemia Panel: No results for input(s): VITAMINB12, FOLATE, FERRITIN, TIBC, IRON, RETICCTPCT in the last 72 hours. Urine analysis:    Component Value Date/Time   COLORURINE YELLOW (A) 03/15/2020 1305   APPEARANCEUR TURBID (A) 03/15/2020 1305   APPEARANCEUR Hazy 07/13/2013 0212   LABSPEC 1.015 03/15/2020 1305   LABSPEC 1.015 07/13/2013 0212   PHURINE 5.0 03/15/2020 1305   GLUCOSEU 50 (A) 03/15/2020 1305   GLUCOSEU 50 mg/dL 07/13/2013 0212   HGBUR NEGATIVE 03/15/2020 1305   BILIRUBINUR NEGATIVE 03/15/2020 1305   BILIRUBINUR Negative 07/13/2013 0212   KETONESUR NEGATIVE 03/15/2020 1305   PROTEINUR >=300 (A) 03/15/2020 1305   NITRITE NEGATIVE 03/15/2020 1305   LEUKOCYTESUR MODERATE (A) 03/15/2020 1305    LEUKOCYTESUR Negative 07/13/2013 0212   Recent Results (from the past 240 hour(s))  Respiratory Panel by RT PCR (Flu A&B, Covid) - Nasopharyngeal Swab     Status: None   Collection Time: 03/15/20  1:05 PM   Specimen: Nasopharyngeal Swab  Result Value Ref Range Status   SARS Coronavirus 2 by RT PCR NEGATIVE NEGATIVE Final    Comment: (NOTE) SARS-CoV-2 target nucleic acids are NOT DETECTED.  The SARS-CoV-2 RNA is generally detectable in upper respiratoy specimens during the acute phase of infection. The lowest concentration of SARS-CoV-2 viral copies this assay can detect is 131 copies/mL. A negative result does not preclude SARS-Cov-2 infection and should not be used as the sole basis for treatment or other patient management decisions. A negative result may occur with  improper specimen collection/handling, submission of specimen other than nasopharyngeal swab, presence of viral mutation(s) within the areas targeted by this assay, and inadequate number of viral copies (<131 copies/mL). A negative result must be combined with clinical observations, patient history, and epidemiological information. The expected result is Negative.  Fact Sheet for Patients:  PinkCheek.be  Fact Sheet for Healthcare Providers:  GravelBags.it  This test is no t yet approved or cleared by the [place] FDA and  has been authorized for detection and/or diagnosis of SARS-CoV-2 by FDA under an Emergency Use Authorization (EUA). This EUA will remain  in effect (meaning this test can be used) for the duration of the COVID-19 declaration under Section 564(b)(1) of the Act, 21 U.S.C. section 360bbb-3(b)(1), unless the authorization is terminated or revoked sooner.     Influenza A by PCR NEGATIVE NEGATIVE Final   Influenza B by PCR NEGATIVE NEGATIVE Final    Comment: (NOTE) The Xpert Xpress SARS-CoV-2/FLU/RSV assay is intended as an aid in   the diagnosis of influenza from Nasopharyngeal swab specimens and  should not be used as a sole basis for treatment. Nasal washings and  aspirates are unacceptable for Xpert Xpress SARS-CoV-2/FLU/RSV  testing.  Fact Sheet for Patients: PinkCheek.be  Fact Sheet for Healthcare Providers: GravelBags.it  This test is not yet approved or cleared by the [place] FDA and  has been authorized for detection and/or diagnosis of SARS-CoV-2 by  FDA under an Emergency Use Authorization (EUA). This EUA will remain  in effect (meaning this test can be used) for the duration of the  Covid-19 declaration under Section  564(b)(1) of the Act, 21  U.S.C. section 360bbb-3(b)(1), unless the authorization is  terminated or revoked. Performed at [facility], [address] [place], [place] [address]   Culture, blood (routine x 2)     Status: None (Preliminary result)   Collection Time: 03/15/20  1:07 PM   Specimen: BLOOD  Result Value Ref Range Status   Specimen Description BLOOD RIGHT ARM  Final   Special Requests   Final    BOTTLES DRAWN AEROBIC AND ANAEROBIC Blood Culture adequate volume   Culture   Final    NO GROWTH 2 DAYS Performed at [facility], [address] [place], [place] [address]    Report Status PENDING  Incomplete  Culture, blood (routine x 2)     Status: None (Preliminary result)   Collection Time: 03/15/20  2:34 PM   Specimen: BLOOD  Result Value Ref Range Status   Specimen Description BLOOD BLOOD RIGHT HAND  Final   Special Requests   Final    BOTTLES DRAWN AEROBIC AND ANAEROBIC Blood Culture results may not be optimal due to an inadequate volume of blood received in culture bottles   Culture   Final    NO GROWTH 2 DAYS Performed at [facility], [address], [place], [place] [address]    Report Status PENDING  Incomplete      Radiology Studies: CT Head Wo Contrast  Result  Date: 03/15/2020 CLINICAL DATA:  Mental status change EXAM: CT HEAD WITHOUT CONTRAST TECHNIQUE: Contiguous axial images were obtained from the base of the skull through the vertex without intravenous contrast. COMPARISON:  12/07/2019 head CT and prior.  10/29/2019 MRI head. FINDINGS: Brain: No intracranial hemorrhage. Focal left corona radiata hypodensity is new (2:20). Sequela of remote right frontal and right occipitotemporal insults. Mild cerebral atrophy with ex vacuo dilatation. Scattered and confluent hypodense foci involving the periventricular and subcortical white matter are nonspecific however commonly associated with chronic microvascular ischemic changes. No mass lesion. No midline shift, ventriculomegaly or extra-axial fluid collection. Vascular: No hyperdense vessel or unexpected calcification. Skull: No acute finding. Sinuses/Orbits: Normal orbits. Layering right maxillary and sphenoid secretions. No mastoid effusion. Other: None. IMPRESSION: Left corona radiata hypodensity concerning for acute/subacute insult. Remote right frontal and right occipitotemporal insults. Mild cerebral atrophy and advanced chronic microvascular ischemic changes. Electronically Signed   By: Primitivo Gauze M.D.   On: 03/15/2020 14:44   MR ANGIO HEAD WO CONTRAST  Result Date: 03/16/2020 CLINICAL DATA:  86 year old female with evidence of late subacute infarct of the left corona radiata on MRI yesterday. EXAM: MRA HEAD WITHOUT CONTRAST TECHNIQUE: Angiographic images of the Circle of Willis were obtained using MRA technique without intravenous contrast. COMPARISON:  Brain MRI 03/15/2020.  CTA head and neck 10/28/2019. FINDINGS: Mildly degraded by motion artifact at the level of the vertebrobasilar junction, petrous ICAs. Antegrade flow in the posterior circulation. Stable distal vertebral arteries, the left mildly dominant. Patent vertebrobasilar junction and basilar artery. No vertebrobasilar stenosis. Patent SCA  and PCA origins. Multifocal moderate bilateral P1 and P2 segment stenoses. Bilateral PCA branches appear stable since the May CTA. Antegrade flow in both ICA siphons. Both siphons appear stable since May with mild cavernous segment ectasia and no significant stenosis. Patent carotid termini. Severe bilateral MCA origin stenoses, and additional tandem severe right M1 stenoses (series 1024, image 9). The most distal right M1 stenosis is new since May. The MCA bifurcations remain patent. Grossly stable visible MCA branches. Left ACA A1 segment  remains within normal limits. Moderate stenosis right A1 appears stable since May. Severe multifocal bilateral ACA A2 stenoses appear stable since May. IMPRESSION: 1. Negative for large vessel occlusion. 2. Positive for severe intracranial atherosclerosis, notable for: - tandem Severe Right MCA M1 stenoses, including a new Severe distal right M1 stenosis since the CTA 10/28/2019. - Severe Left MCA origin stenosis, stable. - Moderate right A1 and multifocal Severe bilateral A2 segment stenoses, stable. - Moderate multifocal bilateral PCA stenoses, stable. Electronically Signed   By: Genevie Ann M.D.   On: 03/16/2020 23:40   MR BRAIN WO CONTRAST  Result Date: 03/15/2020 CLINICAL DATA:  86 year old female with altered mental status. History of prior stroke. EXAM: MRI HEAD WITHOUT CONTRAST TECHNIQUE: Multiplanar, multiecho pulse sequences of the brain and surrounding structures were obtained without intravenous contrast. COMPARISON:  Head CT earlier today.  Brain MRI 10/29/2019. FINDINGS: Brain: Subtle increased trace diffusion signal in the left corona radiata and centrum semiovale where new confluent white matter T2 and FLAIR hyperintensity has developed since June (series 12, image 18). However, this is not clearly restricted on ADC. No associated hemorrhage or mass effect at that site. No diffusion restriction elsewhere. Stable multifocal cortical and subcortical  encephalomalacia including in the superior right MCA and right MCA/PCA watershed areas. Underlying confluent bilateral cerebral white matter T2 and FLAIR hyperintensity is stable outside of the left superior frontal lobe. Chronic microhemorrhage in the right periatrial white matter is stable. A microhemorrhage is visible in the midline dorsal pons today, that area was obscured previously on SWI. Patchy T2 heterogeneity in the pons is stable. Interestingly, the deep gray nuclei and cerebellum remain within normal limits. No midline shift, mass effect, evidence of mass lesion, ventriculomegaly, extra-axial collection or acute intracranial hemorrhage. Cervicomedullary junction and pituitary are within normal limits. Vascular: Major intracranial vascular flow voids appear stable since June. Skull and upper cervical spine: Stable visible cervical spine, chronic disc disease at C2-C3. Normal bone marrow signal. Sinuses/Orbits: Stable and negative orbits. Chronic right sphenoid and maxillary sinus mucosal thickening. Other: Mastoids remain clear. Grossly normal visible internal auditory structures. Scalp and face appear negative. IMPRESSION: 1. Late subacute white matter infarct in the left corona radiata/centrum semiovale. No associated hemorrhage or mass effect. 2. Elsewhere stable ischemic disease since the June MRI. No acute intracranial abnormality. Electronically Signed   By: Genevie Ann M.D.   On: 03/15/2020 21:30   US Carotid Bilateral (at [facility] and [facility] only)  Result Date: 03/16/2020 CLINICAL DATA:  Hypertension, stroke, hyperlipidemia EXAM: BILATERAL CAROTID DUPLEX ULTRASOUND TECHNIQUE: Pearline Cables scale imaging, color Doppler and duplex ultrasound were performed of bilateral carotid and vertebral arteries in the neck. COMPARISON:  None. FINDINGS: Criteria: Quantification of carotid stenosis is based on velocity parameters that correlate the residual internal carotid diameter with NASCET-based stenosis levels, using the  diameter of the distal internal carotid lumen as the denominator for stenosis measurement. The following velocity measurements were obtained: RIGHT ICA: 64/23 cm/sec CCA: 87/56 cm/sec SYSTOLIC ICA/CCA RATIO:  1.6 ECA: 88 cm/sec LEFT ICA: 59/26 cm/sec CCA: 43/32 cm/sec SYSTOLIC ICA/CCA RATIO:  1.4 ECA: 60 cm/sec RIGHT CAROTID ARTERY: Mild to moderate echogenic shadowing plaque formation. No hemodynamically significant right ICA stenosis, velocity elevation, or turbulent flow. Degree of narrowing less than 50%. RIGHT VERTEBRAL ARTERY:  Normal antegrade flow LEFT CAROTID ARTERY: Similar scattered mild echogenic plaque formation. No hemodynamically significant left ICA stenosis, velocity elevation, or turbulent flow. LEFT VERTEBRAL ARTERY:  Normal antegrade flow IMPRESSION: Bilateral carotid atherosclerosis. No  hemodynamically significant ICA stenosis. Degree of narrowing less than 50% bilaterally by ultrasound criteria. Patent antegrade vertebral flow bilaterally Electronically Signed   By: Jerilynn Mages.  Shick M.D.   On: 03/16/2020 08:52   DG Chest Portable 1 View  Result Date: 03/15/2020 CLINICAL DATA:  Altered mental status EXAM: PORTABLE CHEST 1 VIEW COMPARISON:  December 07, 2019 FINDINGS: There is no edema or airspace opacity. Heart is mildly enlarged with pulmonary vascularity normal. No adenopathy. There is aortic atherosclerosis. No evident pneumothorax. No bone lesions. IMPRESSION: Stable cardiac prominence.  No edema or airspace opacity. Aortic Atherosclerosis (ICD10-I70.0). Electronically Signed   By: Lowella Grip III M.D.   On: 03/15/2020 14:02   ECHOCARDIOGRAM COMPLETE  Result Date: 03/16/2020    ECHOCARDIOGRAM REPORT   Patient Name:   [facility] Date of Exam: 03/16/2020 Medical Rec #:  [id number]               Height:       66.0 in Accession #:    [id number]              Weight:       119.0 lb Date of Birth:  Mar 04, 1919               BSA:          1.604 m Patient Age:    76 years                 BP:           153/110 mmHg Patient Gender: F                       HR:           70 bpm. Exam Location:  [facility] Procedure: 2D Echo, Color Doppler and Cardiac Doppler Indications:     I163.9 Stroke  History:         Patient has prior history of Echocardiogram examinations, most                  recent 10/29/2019. Arrythmias:Atrial Fibrillation.  Sonographer:     Charmayne Sheer RDCS (AE) Referring Phys:  [place] Diagnosing Phys: Isaias Cowman MD  Sonographer Comments: Suboptimal parasternal window. IMPRESSIONS  1. Left ventricular ejection fraction, by estimation, is 60 to 65%. The left ventricle has normal function. The left ventricle has no regional wall motion abnormalities. Left ventricular diastolic parameters are consistent with Grade I diastolic dysfunction (impaired relaxation).  2. Right ventricular systolic function is normal. The right ventricular size is normal.  3. The mitral valve is normal in structure. Mild to moderate mitral valve regurgitation. No evidence of mitral stenosis.  4. Tricuspid valve regurgitation is moderate.  5. The aortic valve is normal in structure. Aortic valve regurgitation is mild. No aortic stenosis is present.  6. The inferior vena cava is normal in size with greater than 50% respiratory variability, suggesting right atrial pressure of 3 mmHg. FINDINGS  Left Ventricle: Left ventricular ejection fraction, by estimation, is 60 to 65%. The left ventricle has normal function. The left ventricle has no regional wall motion abnormalities. The left ventricular internal cavity size was normal in size. There is  no left ventricular hypertrophy. Left ventricular diastolic parameters are consistent with Grade I diastolic dysfunction (impaired relaxation). Right Ventricle: The right ventricular size is normal. No increase in right ventricular wall thickness. Right ventricular systolic function is normal. Left Atrium: Left atrial size was normal  in size. Right Atrium: Right atrial  size was normal in size. Pericardium: There is no evidence of pericardial effusion. Mitral Valve: The mitral valve is normal in structure. Mild to moderate mitral valve regurgitation. No evidence of mitral valve stenosis. MV peak gradient, 10.5 mmHg. The mean mitral valve gradient is 4.0 mmHg. Tricuspid Valve: The tricuspid valve is normal in structure. Tricuspid valve regurgitation is moderate . No evidence of tricuspid stenosis. Aortic Valve: The aortic valve is normal in structure. Aortic valve regurgitation is mild. No aortic stenosis is present. Aortic valve mean gradient measures 6.0 mmHg. Aortic valve peak gradient measures 13.5 mmHg. Aortic valve area, by VTI measures 3.05  cm. Pulmonic Valve: The pulmonic valve was normal in structure. Pulmonic valve regurgitation is not visualized. No evidence of pulmonic stenosis. Aorta: The aortic root is normal in size and structure. Venous: The inferior vena cava is normal in size with greater than 50% respiratory variability, suggesting right atrial pressure of 3 mmHg. IAS/Shunts: No atrial level shunt detected by color flow Doppler.  LEFT VENTRICLE PLAX 2D LVIDd:         3.52 cm  Diastology LVIDs:         2.18 cm  LV e' medial:    4.46 cm/s LV PW:         1.06 cm  LV E/e' medial:  29.4 LV IVS:        0.79 cm  LV e' lateral:   4.35 cm/s LVOT diam:     2.30 cm  LV E/e' lateral: 30.1 LV SV:         104 LV SV Index:   65 LVOT Area:     4.15 cm  RIGHT VENTRICLE RV Basal diam:  3.13 cm LEFT ATRIUM             Index       RIGHT ATRIUM           Index LA diam:        3.00 cm 1.87 cm/m  RA Area:     19.60 cm LA Vol (A2C):   61.8 ml 38.53 ml/m RA Volume:   65.20 ml  40.65 ml/m LA Vol (A4C):   42.8 ml 26.68 ml/m LA Biplane Vol: 51.8 ml 32.29 ml/m  AORTIC VALVE                    PULMONIC VALVE AV Area (Vmax):    2.85 cm     PV Vmax:       1.49 m/s AV Area (Vmean):   2.94 cm     PV Vmean:      91.200 cm/s AV Area (VTI):     3.05 cm     PV VTI:        0.284 m AV Vmax:            184.00 cm/s  PV Peak grad:  8.9 mmHg AV Vmean:          116.000 cm/s PV Mean grad:  4.0 mmHg AV VTI:            0.342 m AV Peak Grad:      13.5 mmHg AV Mean Grad:      6.0 mmHg LVOT Vmax:         126.00 cm/s LVOT Vmean:        82.100 cm/s LVOT VTI:          0.251 m LVOT/AV VTI ratio: 0.73  AORTA Ao Root  diam: 3.30 cm MITRAL VALVE                TRICUSPID VALVE MV Area (PHT): 2.44 cm     TR Peak grad:   38.2 mmHg MV Peak grad:  10.5 mmHg    TR Vmax:        309.00 cm/s MV Mean grad:  4.0 mmHg MV Vmax:       1.62 m/s     SHUNTS MV Vmean:      99.8 cm/s    Systemic VTI:  0.25 m MV Decel Time: 311 msec     Systemic Diam: 2.30 cm MV E velocity: 131.00 cm/s MV A velocity: 158.00 cm/s MV E/A ratio:  0.83 Isaias Cowman MD Electronically signed by Isaias Cowman MD Signature Date/Time: 03/16/2020/1:40:19 PM    Final     Scheduled Meds: .  stroke: mapping our early stages of recovery book   Does not apply Once  . aspirin EC  81 mg Oral Daily  . clopidogrel  75 mg Oral Daily  . feeding supplement  237 mL Oral BID BM  . heparin  5,000 Units Subcutaneous Q8H  . latanoprost  1 drop Both Eyes QHS  . mirtazapine  7.5 mg Oral QHS  . nystatin   Topical TID  . PARoxetine  20 mg Oral Daily  . potassium chloride  40 mEq Oral Once  . rosuvastatin  20 mg Oral Daily   Continuous Infusions: . cefTRIAXone (ROCEPHIN)  IV       LOS: 2 days   Total time spent 35 minutes in seeing, examining patient, coordinating patient care, greater than 50% of which was spent at the bedside.   Patrecia Pour, MD [facility] [email or web address] 03/17/2020, 12:54 PM

## 2020-03-17 NOTE — Evaluation (Signed)
Clinical/Bedside Swallow Evaluation Patient Details  Name: Christy Waters MRN: [id number] Date of Birth: [birth year]  Today's Date: 03/17/2020 Time: SLP Start Time (ACUTE ONLY): 59 SLP Stop Time (ACUTE ONLY): 1310 SLP Time Calculation (min) (ACUTE ONLY): 30 min  Past Medical History:  Past Medical History:  Diagnosis Date  . Anemia    distant past  . Arthritis    fingers, hips  . Cancer ([place])    skin cancer; some spots on left side of face; been 5 years since last treated;   . Headache   . History of echocardiogram    a. TTE 04/06/17: EF > 55%, no RWMA, no LA mass, severe TR, RV systolic dysfunction, elevated PASP; b. TTE 04/2017: EF 65-70%, no RWMA, Gr1DD, calcified mitral annulus w/ mild MR, mild to mod dilated LA w/ irregular, echodense material in the LA incompletely visualized, possibly representing artifact vs thrombus, or intracardiac mass, rec TEE, mod TR, mildly dilated RV, inc PASP  . History of nuclear stress test    a. 12/18: no sig ischemia, low risk  . HLD (hyperlipidemia)   . Left atrial mass    a. see echo from 04/2017 - ? mass vs LA thrombus in the setting of PAF -->Eliquis.  . Osteoporosis   . PAF (paroxysmal atrial fibrillation) ([place])    a. diagnosed 05/10/2017; b. CHADS2VASc --> 3 (age x 2, female)--> Eliquis 2.5 (age/wt).   Past Surgical History:  Past Surgical History:  Procedure Laterality Date  . CATARACT EXTRACTION W/PHACO Left 09/14/2015   Procedure: CATARACT EXTRACTION PHACO AND INTRAOCULAR LENS PLACEMENT (IOC);  Surgeon: Ronnell Freshwater, MD;  Location: [place];  Service: Ophthalmology;  Laterality: Left;  MALYUGIN  . EYE SURGERY Right    cataract surgery schedule for Easter Monday 2017;    HPI:  Christy Waters is a 86 y.o. female with a history of right ACA, MCA CVA with residual left hemiparesis, HLD, PAF no longer on eliquis who presented from [facility] 10/17 for confusion and question of gaze deviation as well as  reported soft blood pressures. CT demonstrated acute vs. subacute CVA in the corona radiata. Urinalysis suggested UTI   Assessment / Plan / Recommendation Clinical Impression  ST consulted as pt had difficulty taking her medicine whole and she was observed pocketing regular diet items. Per chart, pt doesn't have any history of dysphagia. Pt's daughter was present and had fed her regular breakfast items. She described coughing and pocketing with current items as an acute condition d/t acute CVA. Pt intermittently demonstrates prolonged oral phase with liquid wash helpful in clearing bolus. When consuming thin liquids via cup and straw pt had immediate subtle throat clears. Skilled observation was provided of pt taking her medicine whole with thin liquids. Pt with significant coughing. I reviewed these findings with pt's daughter and Palliative Care who were meeting outside of pt's room. At that time, pt's daughter stated that pt is currently on a pureed diet with thickened liquids, medicine crushed in puree. Given pt's current presentation, will return pt to baseline diet. All questions were answered to daughter's satisfaction. ST intervention is not indicated. SLP Visit Diagnosis: Dysphagia, oropharyngeal phase (R13.12)    Aspiration Risk  Moderate aspiration risk;Risk for inadequate nutrition/hydration    Diet Recommendation Dysphagia 1 (Puree);Nectar-thick liquid   Liquid Administration via: Cup Medication Administration: Crushed with puree Supervision: Full supervision/cueing for compensatory strategies;Staff to assist with self feeding Compensations: Minimize environmental distractions;Slow rate;Small sips/bites Postural Changes: Seated upright at 90 degrees  Other  Recommendations Oral Care Recommendations: Oral care BID Other Recommendations: Order thickener from pharmacy;Remove water pitcher;Prohibited food (jello, ice cream, thin soups)   Follow up Recommendations  (Palliative  Care/Hospice)      Frequency and Duration            Prognosis        Swallow Study   General Date of Onset: 03/17/20 HPI: Christy Waters is a 86 y.o. female with a history of right ACA, MCA CVA with residual left hemiparesis, HLD, PAF no longer on eliquis who presented from [facility] 10/17 for confusion and question of gaze deviation as well as reported soft blood pressures. CT demonstrated acute vs. subacute CVA in the corona radiata. Urinalysis suggested UTI Type of Study: Bedside Swallow Evaluation Previous Swallow Assessment: none in chart Diet Prior to this Study: Regular;Thin liquids Temperature Spikes Noted: No Respiratory Status: Room air History of Recent Intubation: No Behavior/Cognition: Alert;Cooperative;Pleasant mood Oral Cavity Assessment: Within Functional Limits Oral Care Completed by SLP: No Oral Cavity - Dentition: Adequate natural dentition Vision: Impaired for self-feeding Self-Feeding Abilities: Needs assist;Needs set up Patient Positioning: Upright in bed Baseline Vocal Quality: Normal Volitional Cough: Strong Volitional Swallow: Able to elicit    Oral/Motor/Sensory Function Overall Oral Motor/Sensory Function: Within functional limits   Ice Chips Ice chips: Not tested   Thin Liquid Thin Liquid: Impaired Presentation: Self Fed;Cup;Straw Pharyngeal  Phase Impairments: Cough - Immediate;Throat Clearing - Delayed    Nectar Thick Nectar Thick Liquid: Not tested   Honey Thick Honey Thick Liquid: Not tested   Puree Puree: Within functional limits Presentation: Spoon   Solid     Solid: Impaired Oral Phase Functional Implications: Impaired mastication;Oral residue Pharyngeal Phase Impairments: Throat Clearing - Immediate     Emilliano Dilworth B. Rutherford Nail, M.S., CCC-SLP, [facility] Rehabilitation Services Office [phone]  Christy Waters 03/17/2020,3:55 PM

## 2020-03-17 NOTE — Progress Notes (Signed)
Spoke with lab regarding AM blood work that had not been drawn. Lab stated pt had refused. Requested phlebotomy team to come back to attempt to draw.

## 2020-03-17 NOTE — Progress Notes (Signed)
Daily Progress Note   Patient Name: Christy Waters       Date: 03/17/2020 DOB: Feb 08, 1919  Age: 86 y.o. MRN#: [id number] Attending Physician: Patrecia Pour, MD Primary Care Physician: Rusty Aus, MD Admit Date: 03/15/2020  Reason for Consultation/Follow-up: Establishing goals of care  Subjective: Patient is resting in bed with daughter Christy Waters at bedside. Patient is able to tell me her name, where we are, the president, and that she is here because of a stroke. She does state she still sews and does things she no longer is able to do.   We discussed her diagnoses, prognosis, GOC, EOL wishes disposition and options.  A detailed discussion was had today regarding advanced directives.  Concepts specific to code status, artifical feeding and hydration, IV antibiotics and rehospitalization were discussed.  The difference between an aggressive medical intervention path and a comfort care path was discussed.  Values and goals of care important to patient and family were attempted to be elicited. Discussed limitations of medical interventions to prolong quality of life.  Patient states she would be okay with continuing to come to the hospital and to have work up. She states he QOL is acceptable at [facility] time. Christy Waters states she is more of the mindset to focus on comfort than her sister. She states they not want heroics, and she herself wants to focus on comfort. We discussed this further. We discussed  patient's dysphagia diet and per our conversation she does not eat or drink well enough, and Christy Waters "supplements" with regular liquids or foods Ms. Moeckel enjoys.  Upon discussing allowing her to change her diet completely to what she would enjoy for quality of life, Christy Waters wants to stay with the dysphagia diet  stating her mother is okay with the diet. They would like to continue to treat the treatable. She states she does not feel they would not want a feeding tube, and we discussed that depending on her oral intake, a question of a feeding tube or comfort focused care could be imminent. Christy Waters further states she was not the person who agreed with hospice and states she was told hospice is not just for those who are dying.  Patient needs further conversations with her family, and upon D/C continued conversations with [facility] who she is currently followed by.  Length of Stay: 2  Current Medications: Scheduled Meds:  .  stroke: mapping our early stages of recovery book   Does not apply Once  . aspirin EC  81 mg Oral Daily  . clopidogrel  75 mg Oral Daily  . feeding supplement  237 mL Oral BID BM  . heparin  5,000 Units Subcutaneous Q8H  . latanoprost  1 drop Both Eyes QHS  . mirtazapine  7.5 mg Oral QHS  . nystatin   Topical TID  . PARoxetine  20 mg Oral Daily    Continuous Infusions: . cefTRIAXone (ROCEPHIN)  IV      PRN Meds: acetaminophen **OR** acetaminophen (TYLENOL) oral liquid 160 mg/5 mL **OR** acetaminophen, bisacodyl, hydrALAZINE, morphine CONCENTRATE, ondansetron, senna-docusate  Physical Exam Pulmonary:     Effort: Pulmonary effort is normal.  Neurological:     Mental Status: She is alert.             Vital Signs: BP (!) 181/90 (BP Location: Right Arm)   Pulse 72   Temp 98.2 F (36.8 C) (Oral)   Resp 18   Wt 50.8 kg   SpO2 98%   BMI 18.08 kg/m  SpO2: SpO2: 98 % O2 Device: O2 Device: Room Air O2 Flow Rate:    Intake/output summary:   Intake/Output Summary (Last 24 hours) at 03/17/2020 1352 Last data filed at 03/16/2020 1702 Gross per 24 hour  Intake 833.7 ml  Output --  Net 833.7 ml   LBM: Last BM Date: 03/16/20 Baseline Weight: Weight: 50.8 kg Most recent weight: Weight: 50.8 kg      Patient Active Problem List   Diagnosis Date Noted  .  Acute CVA (cerebrovascular accident) ([place]) 03/15/2020  . PAF (paroxysmal atrial fibrillation) ([place])   . Stroke ([place]) 10/28/2019  . Hypertensive urgency 09/16/2019  . Syncope, vasovagal 09/15/2019  . Syncope and collapse 09/15/2019  . Acute lower UTI 09/15/2019  . Pelvic pain in female 03/20/2018  . Cor pulmonale ([place]) 09/19/2017  . Low serum vitamin D 06/19/2017  . History of atrial fibrillation 06/19/2017  . Chest pain 05/10/2017  . Moderate episode of recurrent major depressive disorder ([place]) 04/04/2017  . Adult idiopathic generalized osteoporosis 04/13/2016  . 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 Irritable bowel syndrome with diarrhea 05/20/2014  . Abdominal pain, generalized 04/01/2014  . Atherosclerosis of aorta ([place]) 03/05/2014  . 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 Idiopathic peripheral neuropathy 03/05/2014  . Abdominal pain of unknown cause 02/06/2014  . MI (mitral incompetence) 02/06/2014  . Stress incontinence 02/06/2014  . Fibrosis of lung ([place]) 10/03/2013  . Generalized OA 10/03/2013  . HLD (hyperlipidemia) 10/03/2013  . BP (high blood pressure) 10/03/2013  . OP (osteoporosis) 10/03/2013  . Beat, premature ventricular 10/03/2013  . Ventricular premature depolarization 10/03/2013    Palliative Care Assessment & Plan   Recommendations/Plan: Patient needs continued conversations with family and [facility].    Code Status:    Code Status Orders  (From admission, onward)         Start     Ordered   03/15/20 1911  Do not attempt resuscitation (DNR)  Continuous       Question Answer Comment  In the event of cardiac or respiratory ARREST Do not call a "code blue"   In the event of cardiac or respiratory ARREST Do not perform Intubation, CPR, defibrillation or ACLS   In the event of cardiac or respiratory ARREST Use medication by any route, position, wound care, and other measures to  relive pain and suffering. May use oxygen, suction and manual treatment of  airway obstruction as needed for comfort.      03/15/20 1912        Code Status History    Date Active Date Inactive Code Status Order ID Comments User Context   10/28/2019 1334 11/01/2019 1837 DNR [id number]  Clarnce Flock, MD ED   09/15/2019 1844 09/17/2019 2013 DNR [id number]  Neena Rhymes, MD ED   09/15/2019 1814 09/15/2019 1844 DNR [id number]  Drenda Freeze, MD ED   05/10/2017 0354 05/11/2017 2128 Full Code [id number]  Saundra Shelling, MD Inpatient   Advance Care Planning Activity    Advance Directive Documentation     Most Recent Value  Type of Advance Directive --  [DNR]  Pre-existing out of facility DNR order (yellow form or pink MOST form) --  "MOST" Form in Place? --      Prognosis:  Unable to determine   Care plan was discussed with SW/CM  Thank you for allowing the Palliative Medicine Team to assist in the care of this patient.   Time In: 12:30 1:20  Total Time 50 min Prolonged Time Billed  no      Greater than 50%  of this time was spent counseling and coordinating care related to the above assessment and plan.  Asencion Gowda, NP  Please contact Palliative Medicine Team phone at [phone] for questions and concerns.

## 2020-03-17 NOTE — Progress Notes (Addendum)
[facility] Liaison note: Patient is currently followed by [facility] hospice services at [facility]. She has a hospice diagnosis of CVA. She is a DNR code. Patient was sent to the [facility] ED at family request on 10/17 for evaluation of altered mental status. Hospice was not notified prior to transport. This is a related admission per hospice physician Dr. Gilford Rile. MRI in the ED revealed late subacute white matter infarct in the left corona radiata/centrum semiovale. Neurology was consulted. Urinalysis was also positive for a UTI. IV antibiotics continue. Urine culture pending.  Visited with patient at bedside. She was alert and some what interactive. Daughter Jan at bedside. Visit made with attending Dr. Bonner Puna, much discussion was had regarding interventions in relation to patient's medications related to her recent CVA. Jan agreed that the cholesterol medication was not needed. Patient appeared comfortable and Jan remained at bedside.  VS: 97.5 axillary, 181/90, 65, 18 98% on room air  I/O 833.7, no output charted  Abnormal Labs: Potassium: 3.2 (L) Calcium: 8.8 (L)  Diagnostics:  CLINICAL DATA:  86 year old female with evidence of late subacute infarct of the left corona radiata on MRI yesterday. EXAM: MRA HEAD WITHOUT CONTRAST IMPRESSION: 1. Negative for large vessel occlusion.  2. Positive for severe intracranial atherosclerosis, notable for: - tandem Severe Right MCA M1 stenoses, including a new Severe distal right M1 stenosis since the CTA 10/28/2019. - Severe Left MCA origin stenosis, stable. - Moderate right A1 and multifocal Severe bilateral A2 segment stenoses, stable. - Moderate multifocal bilateral PCA stenoses, stable.  IV/PRN meds: Tylenol 650 mg Q 4 hrs PRN x 1 dose, Hydralazine 25 mg x 1 dose, Rocephin 1 gm IV daily  Hospital problem list Active Problems:   Atherosclerosis of aorta (HCC)   HLD (hyperlipidemia)   Moderate episode  of recurrent major depressive disorder (HCC)   Acute lower UTI   Acute CVA (cerebrovascular accident) ([place])   PAF (paroxysmal atrial fibrillation) ([place])  Acute metabolic encephalopathy: Suspected primarily due to UTI.  - Improving, will continue treatment as below.   UTI: Urinalysis revealing few bacteria, >50 WBCs/HPF but evidence of nonclean catch. Lactic acid has cleared. - Continue ceftriaxone (has tolerated this despite PCN allergy thus far)  - Follow urine and blood cultures. Urine culture wasn't sent, so add-on requested and is still pending.  - Discussed the patient's innate risk factors for UTIs including age, immobility, incontinence with her daughter. Urged continue hydration.   Late subacute corona radiata infarct, history of cerebrovascular disease and CVA w/left hemiparesis:  - Neurology consult appreciated. MRA revealed widespread severe intracranial stenoses. Will await further neurology recommendations. Carotid atherosclerosis noted with stenosis <50%.  - LDL remains elevated at 168. I discussed risks and benefits of statin therapy/augmentation. We have elected to discontinue statin therapy at this time.  - DAPT ordered per neurology. With AFib, stroke risk reduction would be greatest with anticoagulation though this appears to have been discussed and not continued based on goals of care.  AKI: Suspected AKI with Cr 0.94 w/such advanced age and sarcopenia as well as hyaline casts on UA. SCr improved with IVF again today. Hypernatremia durably resolved.  - DC IVF, monitor on po hydration alone. - Avoid nephrotoxins as able.   Left hemiparesis with left hand contracture:  - Needs fingernails cut - Keep hand dry, will add topical nystatin powder to reduce risk of fungal infection.  Hypokalemia:  - Supplement by mouth if able per RN. Mg wnl. - Push po,  ok to give diet per SLP.   HLD: LDL remains elevated. Based on goals of care discussions, the patient is under  hospice care and will not continue statin at this time.  PAF: Currently appears to be in sinus with frequent PVCs. Can DC telemetry. - Not on anticoagulation per report due to falls.  Depression:  - Continue SSRI, amitriptyline   HTN:  - Permissive HTN for now.  Immunized against covid-19 with [employer].  - Urged to get booster vaccine.   IDG: Updated Discharge Planning: on going, expect return to [facility]. Goals of Care: Ongoing, patient is a DNR. Hospital Palliative was consulted by admitting MD  Please call with any hospice related questions.  Flo Shanks BSN, RN, [place] [phone]

## 2020-03-18 DIAGNOSIS — Z515 Encounter for palliative care: Secondary | ICD-10-CM

## 2020-03-18 MED ORDER — ASPIRIN 81 MG PO CHEW: 81.0000 mg | CHEWABLE_TABLET | Freq: Every day | ORAL | 0 refills | Status: DC

## 2020-03-18 MED ORDER — DOCUSATE SODIUM 100 MG PO CAPS: 100.0000 mg | ORAL_CAPSULE | Freq: Two times a day (BID) | ORAL | 0 refills | Status: AC

## 2020-03-18 MED ORDER — NYSTATIN 100000 UNIT/GM EX POWD: Freq: Three times a day (TID) | CUTANEOUS | 0 refills | Status: DC

## 2020-03-18 MED ORDER — ENSURE ENLIVE PO LIQD: 237.0000 mL | Freq: Two times a day (BID) | ORAL | 0 refills | Status: DC

## 2020-03-18 MED ORDER — CEFDINIR 300 MG PO CAPS
300.0000 mg | ORAL_CAPSULE | Freq: Two times a day (BID) | ORAL | Status: DC
  Filled 2020-03-18 (×2): qty 1

## 2020-03-18 MED ORDER — CEFDINIR 300 MG PO CAPS: 300.0000 mg | ORAL_CAPSULE | Freq: Two times a day (BID) | ORAL | 0 refills | Status: AC

## 2020-03-18 MED ORDER — MORPHINE SULFATE (CONCENTRATE) 20 MG/ML PO SOLN
5.0000 mg | ORAL | 0 refills | Status: AC | PRN
Start: 2020-03-18 — End: ?

## 2020-03-18 MED ORDER — POTASSIUM CHLORIDE CRYS ER 20 MEQ PO TBCR: 40.0000 meq | EXTENDED_RELEASE_TABLET | Freq: Once | ORAL | Status: DC

## 2020-03-18 NOTE — Progress Notes (Signed)
Pt OOF via EMS in stable condition.

## 2020-03-18 NOTE — TOC Transition Note (Signed)
Transition of Care [facility]) - CM/SW Discharge Note   Patient Details  Name: Jaeliana Lococo MRN: [id number] Date of Birth: 1918-05-31  [facility] CM/SW Contact:  Shelbie Hutching, RN Phone Number: 03/18/2020, 12:17 PM   Clinical Narrative:    Patient is ready for discharge back to [facility] today.  Patient will be going to room 206B.  Patient is discharging back with [facility].  Flo Shanks with [facility] aware of discharge today.  RNCM will arranged EMS transport.  Bedside RN will call report to [phone].    Final next level of care: [place] Barriers to Discharge: Barriers Resolved   Patient Goals and CMS Choice Patient states their goals for this hospitalization and ongoing recovery are:: To return to [facility] with Hospice CMS Medicare.gov Compare Post Acute Care list provided to:: Patient Choice offered to / list presented to : Patient  Discharge Placement              Patient chooses bed at: [facility] Patient to be transferred to facility by: [facility] Name of family member notified: Katharine Look and Jan Patient and family notified of of transfer: 03/18/20  Discharge Plan and Services   Discharge Planning Services: CM Consult Post Acute Care Choice: [place], Resumption of Svcs/PTA Provider                               Social Determinants of Health (SDOH) Interventions     Readmission Risk Interventions No flowsheet data found.

## 2020-03-18 NOTE — Progress Notes (Signed)
Report called to [facility]. Aware EMS transport is arranged.

## 2020-03-18 NOTE — TOC Initial Note (Addendum)
Transition of Care [facility]) - Initial/Assessment Note    Patient Details  Name: Christy Waters MRN: [id number] Date of Birth: 06/26/1918  Transition of Care [facility]) CM/SW Contact:    Shelbie Hutching, RN Phone Number: 03/18/2020, 11:15 AM  Clinical Narrative:                 Patient admitted to the hospital with acute CVA, patient also has a history of stroke.  Patient is from [facility] under [facility].  Plan for discharge is to return to [facility] with [facility].  Daughter's are present in the patient room.  Potential discharge today.   Expected Discharge Plan: Skilled Nursing Facility Barriers to Discharge: Continued Medical Work up   Patient Goals and CMS Choice   CMS Medicare.gov Compare Post Acute Care list provided to:: Patient Choice offered to / list presented to : Patient  Expected Discharge Plan and Services Expected Discharge Plan: [place]   Discharge Planning Services: CM Consult Post Acute Care Choice: [place], Resumption of Svcs/PTA Provider Living arrangements for the past 2 months: Skilled Nursing Facility                                      Prior Living Arrangements/Services Living arrangements for the past 2 months: [place] Lives with:: Facility Resident Patient language and need for interpreter reviewed:: Yes Do you feel safe going back to the place where you live?: Yes      Need for Family Participation in Patient Care: Yes (Comment) (history of stroke) Care giver support system in place?: Yes (comment) (daughters)   Criminal Activity/Legal Involvement Pertinent to Current Situation/Hospitalization: No - Comment as needed  Activities of Daily Living Home Assistive Devices/Equipment: None ADL Screening (condition at time of admission) Patient's cognitive ability adequate to safely complete daily activities?: Yes Is the patient deaf or have difficulty  hearing?: No Does the patient have difficulty seeing, even when wearing glasses/contacts?: No Does the patient have difficulty concentrating, remembering, or making decisions?: Yes Patient able to express need for assistance with ADLs?: Yes Does the patient have difficulty dressing or bathing?: Yes Independently performs ADLs?: No Communication: Independent Dressing (OT): Needs assistance Is this a change from baseline?: Pre-admission baseline Grooming: Needs assistance Is this a change from baseline?: Pre-admission baseline Feeding: Needs assistance Is this a change from baseline?: Pre-admission baseline Bathing: Needs assistance Is this a change from baseline?: Pre-admission baseline Toileting: Needs assistance Is this a change from baseline?: Pre-admission baseline In/Out Bed: Needs assistance Is this a change from baseline?: Pre-admission baseline Walks in Home: Needs assistance Is this a change from baseline?: Pre-admission baseline Does the patient have difficulty walking or climbing stairs?: Yes Weakness of Legs: Both Weakness of Arms/Hands: None  Permission Sought/Granted Permission sought to share information with : Case Manager, Customer service manager, Family Supports Permission granted to share information with : Yes, Verbal Permission Granted  Share Information with NAME: Katharine Look and Jan  Permission granted to share info w AGENCY: [place] granted to share info w Relationship: daughters     Emotional Assessment       Orientation: : Oriented to Self, Oriented to Place, Oriented to Situation Alcohol / Substance Use: Not Applicable Psych Involvement: No (comment)  Admission diagnosis:  Acute cystitis without hematuria [N30.00] Acute CVA (cerebrovascular accident) ([place]) [I63.9] Altered mental status,  unspecified altered mental status type [R41.82] Cerebrovascular accident (CVA), unspecified mechanism ([place]) [I63.9] Patient Active Problem List    Diagnosis Date Noted   Acute CVA (cerebrovascular accident) ([place]) 03/15/2020   PAF (paroxysmal atrial fibrillation) ([place])    Stroke ([place]) 10/28/2019   Hypertensive urgency 09/16/2019   Syncope, vasovagal 09/15/2019   Syncope and collapse 09/15/2019   Acute lower UTI 09/15/2019   Pelvic pain in female 03/20/2018   Cor pulmonale ([place]) 09/19/2017   Low serum vitamin D 06/19/2017   History of atrial fibrillation 06/19/2017   Chest pain 05/10/2017   Moderate episode of recurrent major depressive disorder ([place]) 04/04/2017   Adult idiopathic generalized osteoporosis 04/13/2016   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   Irritable bowel syndrome with diarrhea 05/20/2014   Abdominal pain, generalized 04/01/2014   Atherosclerosis of aorta ([place]) 03/05/2014   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 [place] [address] Phone: [phone] Fax: [place], [place] [address] Phone: [phone] Fax: [phone]     Social Determinants of Health (SDOH) Interventions    Readmission Risk Interventions No flowsheet data found.

## 2020-03-18 NOTE — NC FL2 (Signed)
[place] LEVEL OF CARE SCREENING TOOL     IDENTIFICATION  Patient Name: Christy Waters Birthdate: [birth year] Sex: female Admission Date (Current Location): 03/15/2020  [place] and [place] Number:  Engineering geologist and Address:  [facility], [address] [place], [place] [address]      Provider Number: [id number]  Attending Physician Name and Address:  Lavina Hamman, MD  Relative Name and Phone Number:  Lacinda Axon [phone]    Current Level of Care: Hospital Recommended Level of Care: [place] Prior Approval Number:    Date Approved/Denied:   PASRR Number:    Discharge Plan: SNF    Current Diagnoses: Patient Active Problem List   Diagnosis Date Noted  . Acute CVA (cerebrovascular accident) ([place]) 03/15/2020  . PAF (paroxysmal atrial fibrillation) ([place])   . Stroke ([place]) 10/28/2019  . Hypertensive urgency 09/16/2019  . Syncope, vasovagal 09/15/2019  . Syncope and collapse 09/15/2019  . Acute lower UTI 09/15/2019  . Pelvic pain in female 03/20/2018  . Cor pulmonale ([place]) 09/19/2017  . Low serum vitamin D 06/19/2017  . History of atrial fibrillation 06/19/2017  . Chest pain 05/10/2017  . Moderate episode of recurrent major depressive disorder ([place]) 04/04/2017  . Adult idiopathic generalized osteoporosis 04/13/2016  . 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 Irritable bowel syndrome with diarrhea 05/20/2014  . Abdominal pain, generalized 04/01/2014  . Atherosclerosis of aorta ([place]) 03/05/2014  . 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 Idiopathic peripheral neuropathy 03/05/2014  . Abdominal pain of unknown cause 02/06/2014  . MI (mitral incompetence) 02/06/2014  . Stress incontinence 02/06/2014  . Fibrosis of lung ([place]) 10/03/2013  . Generalized OA 10/03/2013  . HLD (hyperlipidemia) 10/03/2013  . BP (high blood pressure) 10/03/2013  . OP (osteoporosis) 10/03/2013  . Beat, premature  ventricular 10/03/2013  . Ventricular premature depolarization 10/03/2013    Orientation RESPIRATION BLADDER Height & Weight     Self, Situation, Place  Normal External catheter Weight: 50.8 kg Height:     BEHAVIORAL SYMPTOMS/MOOD NEUROLOGICAL BOWEL NUTRITION STATUS      Continent Diet (Dysphaga diet 1(puree) nectar thick liquids)  AMBULATORY STATUS COMMUNICATION OF NEEDS Skin   Limited Assist Verbally Normal                       Personal Care Assistance Level of Assistance  Bathing, Feeding, Dressing Bathing Assistance: Limited assistance Feeding assistance: Limited assistance Dressing Assistance: Limited assistance     Functional Limitations Info             SPECIAL CARE FACTORS FREQUENCY                       Contractures Contractures Info: Present (left fingers and left foot drop)    Additional Factors Info  Code Status, Allergies Code Status Info: DNR Allergies Info: Azithromycin, clindamycin, lincomycin, hyoscyamine, maxitrol, timolol, codeine, pcn           Current Medications (03/18/2020):  This is the current hospital active medication list Current Facility-Administered Medications  Medication Dose Route Frequency Provider Last Rate Last Admin  .  stroke: mapping our early stages of recovery book   Does not apply Once Vance Gather B, MD      . acetaminophen (TYLENOL) tablet 650 mg  650 mg Oral Q4H PRN Patrecia Pour, MD   650 mg at 03/18/20 0739   Or  . acetaminophen (TYLENOL) 160 MG/5ML solution 650 mg  650 mg Per Tube Q4H PRN Patrecia Pour, MD       Or  . acetaminophen (TYLENOL) suppository 650 mg  650 mg Rectal Q4H PRN Patrecia Pour, MD      . aspirin EC tablet 81 mg  81 mg Oral Daily Patrecia Pour, MD   81 mg at 03/18/20 0739  . bisacodyl (DULCOLAX) suppository 10 mg  10 mg Rectal PRN Patrecia Pour, MD      . cefTRIAXone (ROCEPHIN) 1 g in sodium chloride 0.9 % 100 mL IVPB  1 g Intravenous Q24H Vance Gather B, MD 200 mL/hr at 03/17/20 1626 1  g at 03/17/20 1626  . clopidogrel (PLAVIX) tablet 75 mg  75 mg Oral Daily Patrecia Pour, MD   75 mg at 03/18/20 0739  . feeding supplement (ENSURE ENLIVE / ENSURE PLUS) liquid 237 mL  237 mL Oral BID BM Vance Gather B, MD   237 mL at 03/18/20 0742  . heparin injection 5,000 Units  5,000 Units Subcutaneous Q8H Patrecia Pour, MD   5,000 Units at 03/17/20 0375  . hydrALAZINE (APRESOLINE) tablet 25 mg  25 mg Oral Q6H PRN Patrecia Pour, MD   25 mg at 03/18/20 0743  . latanoprost (XALATAN) 0.005 % ophthalmic solution 1 drop  1 drop Both Eyes QHS Vance Gather B, MD      . mirtazapine (REMERON) tablet 7.5 mg  7.5 mg Oral QHS Patrecia Pour, MD   7.5 mg at 03/17/20 2055  . morphine CONCENTRATE 10 MG/0.5ML oral solution 5 mg  5 mg Oral Q2H PRN Patrecia Pour, MD      . nystatin (MYCOSTATIN/NYSTOP) topical powder   Topical TID Patrecia Pour, MD   Given at 03/18/20 [phone]  . ondansetron (ZOFRAN) tablet 4 mg  4 mg Oral Q8H PRN Patrecia Pour, MD      . PARoxetine (PAXIL) tablet 20 mg  20 mg Oral Daily Patrecia Pour, MD   20 mg at 03/18/20 0739  . senna-docusate (Senokot-S) tablet 1 tablet  1 tablet Oral QHS PRN Patrecia Pour, MD         Discharge Medications: Please see discharge summary for a list of discharge medications.  Relevant Imaging Results:  Relevant Lab Results:   Additional Information SS#: [id number]  Shelbie Hutching, RN

## 2020-03-18 NOTE — Discharge Summary (Signed)
[facility] Discharge Summary   Patient: Christy Waters RFF:[id number]  PCP: Rusty Aus, MD  Date of admission: 03/15/2020   Date of discharge:  03/18/2020     Discharge Diagnoses:  Principal diagnosis Acute metabolic encephalopathy secondary to UTI Active Problems:   Atherosclerosis of aorta (HCC)   HLD (hyperlipidemia)   Moderate episode of recurrent major depressive disorder ([place])   Acute lower UTI   Acute CVA (cerebrovascular accident) ([place])   PAF (paroxysmal atrial fibrillation) ([place])   Hospice care patient  Admitted From: SNF Disposition:  SNF with hospice  Recommendations for Outpatient Follow-up:  1. PCP: follow up with PCP 2. Follow up LABS/TEST:  none   Follow-up Information    Rusty Aus, MD. Schedule an appointment as soon as possible for a visit in 1 week(s).   Specialty: Internal Medicine Contact information: [facility] [address] [place] [address] [phone]              Diet recommendation: Dysphagia type 1 nector thick Liquid Dysphagia 1 (Puree);Nectar-thick liquid   Liquid Administration via: Cup Medication Administration: Crushed with puree Supervision: Full supervision/cueing for compensatory strategies;Staff to assist with self feeding Compensations: Minimize environmental distractions;Slow rate;Small sips/bites Postural Changes: Seated upright at 90 degrees   Activity: The patient is advised to gradually reintroduce usual activities, as tolerated  Discharge Condition: stable  Code Status: DNR   History of present illness: As per the H and P dictated on admission, " Christy Waters is a 86 y.o. female with a history of right ACA, MCA CVA with residual left hemiparesis, HLD, PAF no longer on eliquis who presented from [facility] 10/17 for confusion and question of gaze deviation as well as reported soft blood pressures. The patient was only oriented to self on arrival but has since gradually  become more aware, still not at baseline per her daughter at the bedside. She denies new weakness or numbness, though CT demonstrated acute vs. subacute CVA in the corona radiata. Urinalysis suggested UTI, WBC 6.8k though lactic acid was 3.6 for which ceftriaxone and 1L LR was given and cultures drawn. Hospitalists called to admit for UTI and CVA. She receives palliative care services per her daughter and is DNR but does not wish for restrictions in care at this time. "  Hospital Course:  Summary of her active problems in the hospital is as following.  Acute metabolic encephalopathy:  Suspected primarily due to UTI.  Improving.   E coli UTI: Urinalysis revealing few bacteria, >50 WBCs/HPF but evidence of nonclean catch.  Lactic acid has cleared. Treated ceftriaxone (has tolerated this despite PCN allergy thus far)  Urine culture wasn't sent, so add-on requested and growing E coli. Blood cultures negative for 3 days so far.  Discussed the patient's innate risk factors for UTIs including age, immobility, incontinence with her daughter. Urged continue hydration.   Late subacute corona radiata infarct,  history of cerebrovascular disease and CVA w/left hemiparesis:  Neurology consult appreciated. MRA revealed widespread severe intracranial stenoses Carotid atherosclerosis noted with stenosis <50%.  LDL remains elevated at 168. I discussed risks and benefits of statin therapy/augmentation. We have elected to discontinue statin therapy at this time.  DAPT ordered per neurology.  With AFib, stroke risk reduction would be greatest with anticoagulation though this appears to have been discussed and not continued based on goals of care.  AKI:  Suspected AKI with Cr 0.94 Hypernatremia resolved.  Avoid nephrotoxins as able.  At risk for  recurrent dehydration due to her being on nectar thick liquids.   Left hemiparesis with left hand contracture:  - Needs fingernails cut - Keep hand dry, will  add topical nystatin powder to reduce risk of fungal infection.  Hypokalemia:  Supplement by mouth Mg wnl.  HLD:  LDL remains elevated.  Based on goals of care discussions, the patient is under hospice care and will not continue statin at this time.  PAF:  Currently appears to be in sinus with frequent PVCs. Not on anticoagulation per report due to falls. At risk for recurrent CVA.   Depression:  - Continue SSRI, amitriptyline   HTN:  Continue home regimen  Underweight Remains at risk of poor outcomes due to this plus being on dysphagia diet.   Body mass index is 18.08 kg/m.   Pain control  - [employer] Controlled Substance Reporting System database was reviewed. - hospice patient.  Goal of care conversation: Pt DNR DNI and active with hospice. With PAF, recurrent CVA, severe intracranial atherosclerosis, bedbound status, recurrent UTI, need for dysphagia 1 nectar thick liquid diet places the pt at poor outcomes.  Daughter understands the prognosis but is concerned about medical conditions and wants to treat treatable, but on the other hand she wants her to have coke or unthickened water for her comfort.  Explains to daughter that the pt will benefit from focusing on comfort as a sole goal of care given her potential to worsens rapidly with her medical co morbidities.  Pt currently remain on hospice.    On the day of the discharge the patient's vitals were stable, and no other acute medical condition were reported by patient. The patient was felt safe to be discharge at SNF with hospice.  Consultants: Palliative care  Procedures: none  Discharge Exam: General: Appear in no distress, no Rash; Oral Mucosa Clear, moist. no Abnormal Neck Mass Or lumps, Conjunctiva normal  Cardiovascular: S1 and S2 Present, aortic systolic  Murmur Respiratory: good respiratory effort, Bilateral Air entry present and CTA, no Crackles, no wheezes Abdomen: Bowel Sound present, Soft  and no tenderness Extremities: no Pedal edema Neurology: alert and oriented to place and person affect appropriate. no new focal deficit  Filed Weights   03/17/20 1035  Weight: 50.8 kg   Vitals:   03/18/20 0900 03/18/20 1140  BP: (!) 142/80 125/76  Pulse:  79  Resp:  (!) 32  Temp:  98.2 F (36.8 C)  SpO2:  99%    DISCHARGE MEDICATION: Allergies as of 03/18/2020      Reactions   Azithromycin Shortness Of Breath, Other (See Comments)   Syncope Can not stand up, feels very sick, weak Syncope Syncope Can not stand up, feels very sick, weak Syncope Syncope Can not stand up, feels very sick, weak Syncope Can not stand up, feels very sick, weak Syncope   Clindamycin Other (See Comments)   GI problem  GI problem  GI problem    Clindamycin/lincomycin Other (See Comments)   Reaction: unknown   Hyoscyamine Diarrhea   Lincomycin Other (See Comments)   Maxitrol [neomycin-polymyxin-dexameth] Other (See Comments)   Eye irritation   Other Other (See Comments)   Eye irritation Eye irritation Eye irritation   Timolol Other (See Comments)   Eye irritation Eye irritation Eye irritation Eye irritation   Codeine Other (See Comments), Nausea Only   Altered mental status FATIGUE, SICK FEELING Altered mental status Altered mental status FATIGUE, SICK FEELING   Penicillins Rash   Has patient had  a PCN reaction causing immediate rash, facial/tongue/throat swelling, SOB or lightheadedness with hypotension: Yes Has patient had a PCN reaction causing severe rash involving mucus membranes or skin necrosis: No Has patient had a PCN reaction that required hospitalization: No Has patient had a PCN reaction occurring within the last 10 years: No If all of the above answers are "NO", then may proceed with Cephalosporin use.      Medication List    TAKE these medications   acetaminophen 325 MG tablet Commonly known as: TYLENOL Take 650 mg by mouth every 4 (four) hours as needed for  mild pain or moderate pain.   aspirin 81 MG chewable tablet Chew 1 tablet (81 mg total) by mouth daily.   bisacodyl 10 MG suppository Commonly known as: DULCOLAX Place 10 mg rectally as needed for moderate constipation.   cefdinir 300 MG capsule Commonly known as: OMNICEF Take 1 capsule (300 mg total) by mouth 2 (two) times daily for 4 days.   clopidogrel 75 MG tablet Commonly known as: PLAVIX Take 75 mg by mouth daily.   diclofenac Sodium 1 % Gel Commonly known as: VOLTAREN Apply 1 g topically every 6 (six) hours as needed (pain). Apply to left hand   docusate sodium 100 MG capsule Commonly known as: Colace Take 1 capsule (100 mg total) by mouth 2 (two) times daily.   feeding supplement Liqd Take 237 mLs by mouth 2 (two) times daily between meals.   fexofenadine 180 MG tablet Commonly known as: ALLEGRA Take 180 mg by mouth daily.   Lumigan 0.01 % Soln Generic drug: bimatoprost Place 1 drop into both eyes at bedtime.   miconazole 2 % cream Commonly known as: MICOTIN Apply 1 application topically 2 (two) times daily. (affected area on back)   mirtazapine 7.5 MG tablet Commonly known as: REMERON Take 7.5 mg by mouth at bedtime.   morphine 20 MG/ML concentrated solution Commonly known as: ROXANOL Take 0.25 mLs (5 mg total) by mouth every 2 (two) hours as needed for severe pain (dyspnea).   nystatin powder Commonly known as: MYCOSTATIN/NYSTOP Apply topically 3 (three) times daily.   olmesartan 5 MG tablet Commonly known as: BENICAR Take 5 mg by mouth at bedtime.   ondansetron 4 MG tablet Commonly known as: ZOFRAN Take 4 mg by mouth every 8 (eight) hours as needed for nausea or vomiting.   PARoxetine 20 MG tablet Commonly known as: PAXIL Take 20 mg by mouth daily.   Vitamin D3 50 MCG (2000 UT) Tabs Take 2,000 Units by mouth at bedtime.      Allergies  Allergen Reactions  . Azithromycin Shortness Of Breath and Other (See Comments)    Syncope Can not  stand up, feels very sick, weak Syncope Syncope Can not stand up, feels very sick, weak Syncope Syncope Can not stand up, feels very sick, weak Syncope Can not stand up, feels very sick, weak Syncope  . Clindamycin Other (See Comments)    GI problem  GI problem  GI problem   . Clindamycin/Lincomycin Other (See Comments)    Reaction: unknown  . Hyoscyamine Diarrhea  . Lincomycin Other (See Comments)  . Maxitrol [Neomycin-Polymyxin-Dexameth] Other (See Comments)    Eye irritation  . Other Other (See Comments)    Eye irritation Eye irritation Eye irritation  . Timolol Other (See Comments)    Eye irritation Eye irritation Eye irritation Eye irritation  . Codeine Other (See Comments) and Nausea Only    Altered mental status FATIGUE, SICK  FEELING Altered mental status Altered mental status FATIGUE, SICK FEELING  . Penicillins Rash    Has patient had a PCN reaction causing immediate rash, facial/tongue/throat swelling, SOB or lightheadedness with hypotension: Yes Has patient had a PCN reaction causing severe rash involving mucus membranes or skin necrosis: No Has patient had a PCN reaction that required hospitalization: No Has patient had a PCN reaction occurring within the last 10 years: No If all of the above answers are "NO", then may proceed with Cephalosporin use.   Discharge Instructions    DIET - DYS 1   Complete by: As directed    Fluid consistency: Nectar Thick   Increase activity slowly   Complete by: As directed       The results of significant diagnostics from this hospitalization (including imaging, microbiology, ancillary and laboratory) are listed below for reference.    Significant Diagnostic Studies: CT Head Wo Contrast  Result Date: 03/15/2020 CLINICAL DATA:  Mental status change EXAM: CT HEAD WITHOUT CONTRAST TECHNIQUE: Contiguous axial images were obtained from the base of the skull through the vertex without intravenous contrast. COMPARISON:   12/07/2019 head CT and prior.  10/29/2019 MRI head. FINDINGS: Brain: No intracranial hemorrhage. Focal left corona radiata hypodensity is new (2:20). Sequela of remote right frontal and right occipitotemporal insults. Mild cerebral atrophy with ex vacuo dilatation. Scattered and confluent hypodense foci involving the periventricular and subcortical white matter are nonspecific however commonly associated with chronic microvascular ischemic changes. No mass lesion. No midline shift, ventriculomegaly or extra-axial fluid collection. Vascular: No hyperdense vessel or unexpected calcification. Skull: No acute finding. Sinuses/Orbits: Normal orbits. Layering right maxillary and sphenoid secretions. No mastoid effusion. Other: None. IMPRESSION: Left corona radiata hypodensity concerning for acute/subacute insult. Remote right frontal and right occipitotemporal insults. Mild cerebral atrophy and advanced chronic microvascular ischemic changes. Electronically Signed   By: Primitivo Gauze M.D.   On: 03/15/2020 14:44   MR ANGIO HEAD WO CONTRAST  Result Date: 03/16/2020 CLINICAL DATA:  87 year old female with evidence of late subacute infarct of the left corona radiata on MRI yesterday. EXAM: MRA HEAD WITHOUT CONTRAST TECHNIQUE: Angiographic images of the Circle of Willis were obtained using MRA technique without intravenous contrast. COMPARISON:  Brain MRI 03/15/2020.  CTA head and neck 10/28/2019. FINDINGS: Mildly degraded by motion artifact at the level of the vertebrobasilar junction, petrous ICAs. Antegrade flow in the posterior circulation. Stable distal vertebral arteries, the left mildly dominant. Patent vertebrobasilar junction and basilar artery. No vertebrobasilar stenosis. Patent SCA and PCA origins. Multifocal moderate bilateral P1 and P2 segment stenoses. Bilateral PCA branches appear stable since the May CTA. Antegrade flow in both ICA siphons. Both siphons appear stable since May with mild cavernous  segment ectasia and no significant stenosis. Patent carotid termini. Severe bilateral MCA origin stenoses, and additional tandem severe right M1 stenoses (series 1024, image 9). The most distal right M1 stenosis is new since May. The MCA bifurcations remain patent. Grossly stable visible MCA branches. Left ACA A1 segment remains within normal limits. Moderate stenosis right A1 appears stable since May. Severe multifocal bilateral ACA A2 stenoses appear stable since May. IMPRESSION: 1. Negative for large vessel occlusion. 2. Positive for severe intracranial atherosclerosis, notable for: - tandem Severe Right MCA M1 stenoses, including a new Severe distal right M1 stenosis since the CTA 10/28/2019. - Severe Left MCA origin stenosis, stable. - Moderate right A1 and multifocal Severe bilateral A2 segment stenoses, stable. - Moderate multifocal bilateral PCA stenoses, stable. Electronically Signed  By: Genevie Ann M.D.   On: 03/16/2020 23:40   MR BRAIN WO CONTRAST  Result Date: 03/15/2020 CLINICAL DATA:  86 year old female with altered mental status. History of prior stroke. EXAM: MRI HEAD WITHOUT CONTRAST TECHNIQUE: Multiplanar, multiecho pulse sequences of the brain and surrounding structures were obtained without intravenous contrast. COMPARISON:  Head CT earlier today.  Brain MRI 10/29/2019. FINDINGS: Brain: Subtle increased trace diffusion signal in the left corona radiata and centrum semiovale where new confluent white matter T2 and FLAIR hyperintensity has developed since June (series 12, image 18). However, this is not clearly restricted on ADC. No associated hemorrhage or mass effect at that site. No diffusion restriction elsewhere. Stable multifocal cortical and subcortical encephalomalacia including in the superior right MCA and right MCA/PCA watershed areas. Underlying confluent bilateral cerebral white matter T2 and FLAIR hyperintensity is stable outside of the left superior frontal lobe. Chronic  microhemorrhage in the right periatrial white matter is stable. A microhemorrhage is visible in the midline dorsal pons today, that area was obscured previously on SWI. Patchy T2 heterogeneity in the pons is stable. Interestingly, the deep gray nuclei and cerebellum remain within normal limits. No midline shift, mass effect, evidence of mass lesion, ventriculomegaly, extra-axial collection or acute intracranial hemorrhage. Cervicomedullary junction and pituitary are within normal limits. Vascular: Major intracranial vascular flow voids appear stable since June. Skull and upper cervical spine: Stable visible cervical spine, chronic disc disease at C2-C3. Normal bone marrow signal. Sinuses/Orbits: Stable and negative orbits. Chronic right sphenoid and maxillary sinus mucosal thickening. Other: Mastoids remain clear. Grossly normal visible internal auditory structures. Scalp and face appear negative. IMPRESSION: 1. Late subacute white matter infarct in the left corona radiata/centrum semiovale. No associated hemorrhage or mass effect. 2. Elsewhere stable ischemic disease since the June MRI. No acute intracranial abnormality. Electronically Signed   By: Genevie Ann M.D.   On: 03/15/2020 21:30   US Carotid Bilateral (at [facility] and [facility] only)  Result Date: 03/16/2020 CLINICAL DATA:  Hypertension, stroke, hyperlipidemia EXAM: BILATERAL CAROTID DUPLEX ULTRASOUND TECHNIQUE: Pearline Cables scale imaging, color Doppler and duplex ultrasound were performed of bilateral carotid and vertebral arteries in the neck. COMPARISON:  None. FINDINGS: Criteria: Quantification of carotid stenosis is based on velocity parameters that correlate the residual internal carotid diameter with NASCET-based stenosis levels, using the diameter of the distal internal carotid lumen as the denominator for stenosis measurement. The following velocity measurements were obtained: RIGHT ICA: 64/23 cm/sec CCA: 54/09 cm/sec SYSTOLIC ICA/CCA RATIO:  1.6 ECA: 88 cm/sec  LEFT ICA: 59/26 cm/sec CCA: 81/19 cm/sec SYSTOLIC ICA/CCA RATIO:  1.4 ECA: 60 cm/sec RIGHT CAROTID ARTERY: Mild to moderate echogenic shadowing plaque formation. No hemodynamically significant right ICA stenosis, velocity elevation, or turbulent flow. Degree of narrowing less than 50%. RIGHT VERTEBRAL ARTERY:  Normal antegrade flow LEFT CAROTID ARTERY: Similar scattered mild echogenic plaque formation. No hemodynamically significant left ICA stenosis, velocity elevation, or turbulent flow. LEFT VERTEBRAL ARTERY:  Normal antegrade flow IMPRESSION: Bilateral carotid atherosclerosis. No hemodynamically significant ICA stenosis. Degree of narrowing less than 50% bilaterally by ultrasound criteria. Patent antegrade vertebral flow bilaterally Electronically Signed   By: Jerilynn Mages.  Shick M.D.   On: 03/16/2020 08:52   DG Chest Portable 1 View  Result Date: 03/15/2020 CLINICAL DATA:  Altered mental status EXAM: PORTABLE CHEST 1 VIEW COMPARISON:  December 07, 2019 FINDINGS: There is no edema or airspace opacity. Heart is mildly enlarged with pulmonary vascularity normal. No adenopathy. There is aortic atherosclerosis. No evident pneumothorax. No  bone lesions. IMPRESSION: Stable cardiac prominence.  No edema or airspace opacity. Aortic Atherosclerosis (ICD10-I70.0). Electronically Signed   By: Lowella Grip III M.D.   On: 03/15/2020 14:02   ECHOCARDIOGRAM COMPLETE  Result Date: 03/16/2020    ECHOCARDIOGRAM REPORT   Patient Name:   [facility] Date of Exam: 03/16/2020 Medical Rec #:  [id number]               Height:       66.0 in Accession #:    [id number]              Weight:       119.0 lb Date of Birth:  [birth year]               BSA:          1.604 m Patient Age:    36 years                BP:           153/110 mmHg Patient Gender: F                       HR:           70 bpm. Exam Location:  [facility] Procedure: 2D Echo, Color Doppler and Cardiac Doppler Indications:     I163.9 Stroke  History:         Patient has  prior history of Echocardiogram examinations, most                  recent 10/29/2019. Arrythmias:Atrial Fibrillation.  Sonographer:     Charmayne Sheer RDCS (AE) Referring Phys:  [place] Diagnosing Phys: Isaias Cowman MD  Sonographer Comments: Suboptimal parasternal window. IMPRESSIONS  1. Left ventricular ejection fraction, by estimation, is 60 to 65%. The left ventricle has normal function. The left ventricle has no regional wall motion abnormalities. Left ventricular diastolic parameters are consistent with Grade I diastolic dysfunction (impaired relaxation).  2. Right ventricular systolic function is normal. The right ventricular size is normal.  3. The mitral valve is normal in structure. Mild to moderate mitral valve regurgitation. No evidence of mitral stenosis.  4. Tricuspid valve regurgitation is moderate.  5. The aortic valve is normal in structure. Aortic valve regurgitation is mild. No aortic stenosis is present.  6. The inferior vena cava is normal in size with greater than 50% respiratory variability, suggesting right atrial pressure of 3 mmHg. FINDINGS  Left Ventricle: Left ventricular ejection fraction, by estimation, is 60 to 65%. The left ventricle has normal function. The left ventricle has no regional wall motion abnormalities. The left ventricular internal cavity size was normal in size. There is  no left ventricular hypertrophy. Left ventricular diastolic parameters are consistent with Grade I diastolic dysfunction (impaired relaxation). Right Ventricle: The right ventricular size is normal. No increase in right ventricular wall thickness. Right ventricular systolic function is normal. Left Atrium: Left atrial size was normal in size. Right Atrium: Right atrial size was normal in size. Pericardium: There is no evidence of pericardial effusion. Mitral Valve: The mitral valve is normal in structure. Mild to moderate mitral valve regurgitation. No evidence of mitral valve stenosis. MV  peak gradient, 10.5 mmHg. The mean mitral valve gradient is 4.0 mmHg. Tricuspid Valve: The tricuspid valve is normal in structure. Tricuspid valve regurgitation is moderate . No evidence of tricuspid stenosis. Aortic Valve: The aortic valve is normal in structure. Aortic valve regurgitation  is mild. No aortic stenosis is present. Aortic valve mean gradient measures 6.0 mmHg. Aortic valve peak gradient measures 13.5 mmHg. Aortic valve area, by VTI measures 3.05  cm. Pulmonic Valve: The pulmonic valve was normal in structure. Pulmonic valve regurgitation is not visualized. No evidence of pulmonic stenosis. Aorta: The aortic root is normal in size and structure. Venous: The inferior vena cava is normal in size with greater than 50% respiratory variability, suggesting right atrial pressure of 3 mmHg. IAS/Shunts: No atrial level shunt detected by color flow Doppler.  LEFT VENTRICLE PLAX 2D LVIDd:         3.52 cm  Diastology LVIDs:         2.18 cm  LV e' medial:    4.46 cm/s LV PW:         1.06 cm  LV E/e' medial:  29.4 LV IVS:        0.79 cm  LV e' lateral:   4.35 cm/s LVOT diam:     2.30 cm  LV E/e' lateral: 30.1 LV SV:         104 LV SV Index:   65 LVOT Area:     4.15 cm  RIGHT VENTRICLE RV Basal diam:  3.13 cm LEFT ATRIUM             Index       RIGHT ATRIUM           Index LA diam:        3.00 cm 1.87 cm/m  RA Area:     19.60 cm LA Vol (A2C):   61.8 ml 38.53 ml/m RA Volume:   65.20 ml  40.65 ml/m LA Vol (A4C):   42.8 ml 26.68 ml/m LA Biplane Vol: 51.8 ml 32.29 ml/m  AORTIC VALVE                    PULMONIC VALVE AV Area (Vmax):    2.85 cm     PV Vmax:       1.49 m/s AV Area (Vmean):   2.94 cm     PV Vmean:      91.200 cm/s AV Area (VTI):     3.05 cm     PV VTI:        0.284 m AV Vmax:           184.00 cm/s  PV Peak grad:  8.9 mmHg AV Vmean:          116.000 cm/s PV Mean grad:  4.0 mmHg AV VTI:            0.342 m AV Peak Grad:      13.5 mmHg AV Mean Grad:      6.0 mmHg LVOT Vmax:         126.00 cm/s LVOT  Vmean:        82.100 cm/s LVOT VTI:          0.251 m LVOT/AV VTI ratio: 0.73  AORTA Ao Root diam: 3.30 cm MITRAL VALVE                TRICUSPID VALVE MV Area (PHT): 2.44 cm     TR Peak grad:   38.2 mmHg MV Peak grad:  10.5 mmHg    TR Vmax:        309.00 cm/s MV Mean grad:  4.0 mmHg MV Vmax:       1.62 m/s     SHUNTS MV Vmean:  99.8 cm/s    Systemic VTI:  0.25 m MV Decel Time: 311 msec     Systemic Diam: 2.30 cm MV E velocity: 131.00 cm/s MV A velocity: 158.00 cm/s MV E/A ratio:  0.83 Isaias Cowman MD Electronically signed by Isaias Cowman MD Signature Date/Time: 03/16/2020/1:40:19 PM    Final     Microbiology: Recent Results (from the past 240 hour(s))  Respiratory Panel by RT PCR (Flu A&B, Covid) - Nasopharyngeal Swab     Status: None   Collection Time: 03/15/20  1:05 PM   Specimen: Nasopharyngeal Swab  Result Value Ref Range Status   SARS Coronavirus 2 by RT PCR NEGATIVE NEGATIVE Final    Comment: (NOTE) SARS-CoV-2 target nucleic acids are NOT DETECTED.  The SARS-CoV-2 RNA is generally detectable in upper respiratoy specimens during the acute phase of infection. The lowest concentration of SARS-CoV-2 viral copies this assay can detect is 131 copies/mL. A negative result does not preclude SARS-Cov-2 infection and should not be used as the sole basis for treatment or other patient management decisions. A negative result may occur with  improper specimen collection/handling, submission of specimen other than nasopharyngeal swab, presence of viral mutation(s) within the areas targeted by this assay, and inadequate number of viral copies (<131 copies/mL). A negative result must be combined with clinical observations, patient history, and epidemiological information. The expected result is Negative.  Fact Sheet for Patients:  PinkCheek.be  Fact Sheet for Healthcare Providers:  GravelBags.it  This test is no t yet  approved or cleared by the [place] FDA and  has been authorized for detection and/or diagnosis of SARS-CoV-2 by FDA under an Emergency Use Authorization (EUA). This EUA will remain  in effect (meaning this test can be used) for the duration of the COVID-19 declaration under Section 564(b)(1) of the Act, 21 U.S.C. section 360bbb-3(b)(1), unless the authorization is terminated or revoked sooner.     Influenza A by PCR NEGATIVE NEGATIVE Final   Influenza B by PCR NEGATIVE NEGATIVE Final    Comment: (NOTE) The Xpert Xpress SARS-CoV-2/FLU/RSV assay is intended as an aid in  the diagnosis of influenza from Nasopharyngeal swab specimens and  should not be used as a sole basis for treatment. Nasal washings and  aspirates are unacceptable for Xpert Xpress SARS-CoV-2/FLU/RSV  testing.  Fact Sheet for Patients: PinkCheek.be  Fact Sheet for Healthcare Providers: GravelBags.it  This test is not yet approved or cleared by the [place] FDA and  has been authorized for detection and/or diagnosis of SARS-CoV-2 by  FDA under an Emergency Use Authorization (EUA). This EUA will remain  in effect (meaning this test can be used) for the duration of the  Covid-19 declaration under Section 564(b)(1) of the Act, 21  U.S.C. section 360bbb-3(b)(1), unless the authorization is  terminated or revoked. Performed at [facility], [address] [place], [place] [address]   Culture, blood (routine x 2)     Status: None (Preliminary result)   Collection Time: 03/15/20  1:07 PM   Specimen: BLOOD  Result Value Ref Range Status   Specimen Description BLOOD RIGHT ARM  Final   Special Requests   Final    BOTTLES DRAWN AEROBIC AND ANAEROBIC Blood Culture adequate volume   Culture   Final    NO GROWTH 3 DAYS Performed at [facility], [address] [place], [place] [address]    Report Status PENDING  Incomplete  Culture,  blood (routine x 2)     Status:  None (Preliminary result)   Collection Time: 03/15/20  2:34 PM   Specimen: BLOOD  Result Value Ref Range Status   Specimen Description BLOOD BLOOD RIGHT HAND  Final   Special Requests   Final    BOTTLES DRAWN AEROBIC AND ANAEROBIC Blood Culture results may not be optimal due to an inadequate volume of blood received in culture bottles   Culture   Final    NO GROWTH 3 DAYS Performed at [facility], [address], [place], [place] [address]    Report Status PENDING  Incomplete  Urine Culture     Status: Abnormal (Preliminary result)   Collection Time: 03/15/20  7:05 PM   Specimen: Urine, Random  Result Value Ref Range Status   Specimen Description   Final    URINE, RANDOM Performed at [facility], [address] [place], [place] [address]    Special Requests   Final    NONE Performed at [facility], [address], [place], [place] [address]    Culture (A)  Final    >=100,000 COLONIES/mL ESCHERICHIA COLI SUSCEPTIBILITIES TO FOLLOW Performed at [facility], [place] [address], [place],  [address]    Report Status PENDING  Incomplete     Labs: CBC: Recent Labs  Lab 03/15/20 1305  WBC 6.8  NEUTROABS 5.7  HGB 9.7*  HCT 29.4*  MCV 93.3  PLT 449   Basic Metabolic Panel: Recent Labs  Lab 03/15/20 1305 03/16/20 0526 03/16/20 1324 03/17/20 1120  NA 141 146*  --  141  K 3.4* 2.5*  --  3.2*  CL 103 109  --  107  CO2 25 25  --  25  GLUCOSE 137* 81  --  92  BUN 12 13  --  11  CREATININE 0.94 0.71  --  0.62  CALCIUM 9.5 8.3*  --  8.8*  MG  --   --  1.7 2.0   Liver Function Tests: Recent Labs  Lab 03/15/20 1305  AST 32  ALT 12  ALKPHOS 64  BILITOT 1.0  PROT 6.6  ALBUMIN 3.6   CBG: No results for input(s): GLUCAP in the last 168 hours.  Time spent: 35 minutes  Signed:  Berle Mull  [facility]  03/18/2020 12:07 PM

## 2020-03-19 DIAGNOSIS — F0150 Vascular dementia without behavioral disturbance: Secondary | ICD-10-CM

## 2020-03-19 DIAGNOSIS — F39 Unspecified mood [affective] disorder: Secondary | ICD-10-CM

## 2020-03-19 DIAGNOSIS — J8410 Pulmonary fibrosis, unspecified: Secondary | ICD-10-CM

## 2020-03-19 DIAGNOSIS — N390 Urinary tract infection, site not specified: Secondary | ICD-10-CM

## 2020-03-19 DIAGNOSIS — G8194 Hemiplegia, unspecified affecting left nondominant side: Secondary | ICD-10-CM

## 2020-03-19 DIAGNOSIS — E440 Moderate protein-calorie malnutrition: Secondary | ICD-10-CM

## 2020-03-19 LAB — URINE CULTURE: Culture: >=100000 — AB

## 2020-03-20 LAB — CULTURE, BLOOD (ROUTINE X 2)
Culture: NO GROWTH
Culture: NO GROWTH
Special Requests: ADEQUATE

## 2020-04-10 DIAGNOSIS — J069 Acute upper respiratory infection, unspecified: Secondary | ICD-10-CM

## 2020-04-10 DIAGNOSIS — F0150 Vascular dementia without behavioral disturbance: Secondary | ICD-10-CM

## 2020-04-10 DIAGNOSIS — E43 Unspecified severe protein-calorie malnutrition: Secondary | ICD-10-CM

## 2020-04-10 DIAGNOSIS — J8410 Pulmonary fibrosis, unspecified: Secondary | ICD-10-CM

## 2020-04-10 DIAGNOSIS — F39 Unspecified mood [affective] disorder: Secondary | ICD-10-CM

## 2020-04-10 DIAGNOSIS — R1084 Generalized abdominal pain: Secondary | ICD-10-CM

## 2020-04-10 DIAGNOSIS — I4891 Unspecified atrial fibrillation: Secondary | ICD-10-CM

## 2020-04-10 DIAGNOSIS — I69354 Hemiplegia and hemiparesis following cerebral infarction affecting left non-dominant side: Secondary | ICD-10-CM

## 2020-04-10 DIAGNOSIS — I10 Essential (primary) hypertension: Secondary | ICD-10-CM

## 2020-05-25 DIAGNOSIS — R059 Cough, unspecified: Secondary | ICD-10-CM

## 2020-06-11 DIAGNOSIS — F39 Unspecified mood [affective] disorder: Secondary | ICD-10-CM | POA: Diagnosis not present

## 2020-06-11 DIAGNOSIS — F0150 Vascular dementia without behavioral disturbance: Secondary | ICD-10-CM

## 2020-06-11 DIAGNOSIS — J8410 Pulmonary fibrosis, unspecified: Secondary | ICD-10-CM

## 2020-06-11 DIAGNOSIS — E440 Moderate protein-calorie malnutrition: Secondary | ICD-10-CM | POA: Diagnosis not present

## 2020-06-11 DIAGNOSIS — G8194 Hemiplegia, unspecified affecting left nondominant side: Secondary | ICD-10-CM | POA: Diagnosis not present

## 2020-06-24 DIAGNOSIS — B351 Tinea unguium: Secondary | ICD-10-CM | POA: Diagnosis not present

## 2020-07-10 IMAGING — MR MR HEAD W/O CM
10 of 11 series · 43 of 48 positions shown · non-contrast
Comparison: Head CT and CTA earlier today

CLINICAL DATA: Stroke symptoms with worsening weakness

EXAM:
MRI HEAD WITHOUT CONTRAST
TECHNIQUE: Multiplanar, multiecho pulse sequences of the brain and surrounding
structures were obtained without intravenous contrast.

[Series 5: DWI · axial · 3.0mm · 0.88mm/px · z∈[-68,+59]mm · 10 of 94 slices shown (1 of 4)]
[im 1/94]
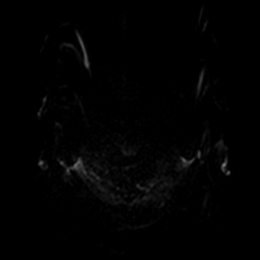
[im 11/94]
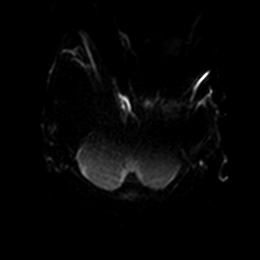
[im 21/94]
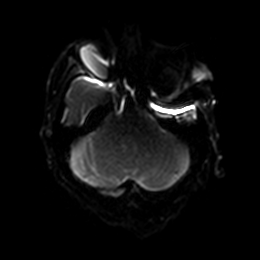
[im 32/94]
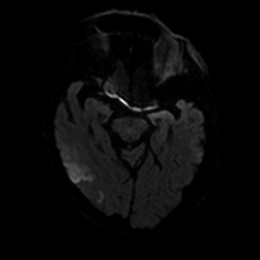
[im 42/94]
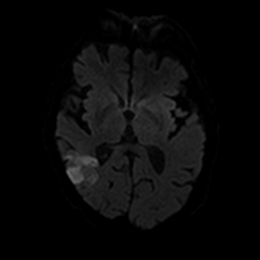
[im 52/94]
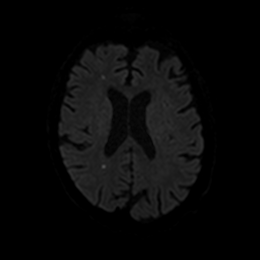
[im 63/94]
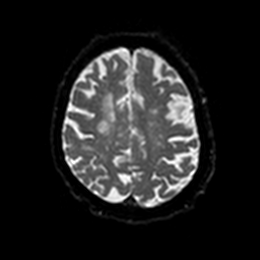
[im 73/94]
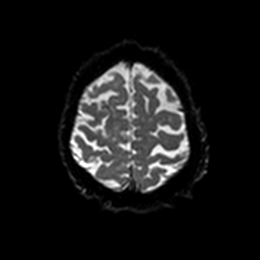
[im 83/94]
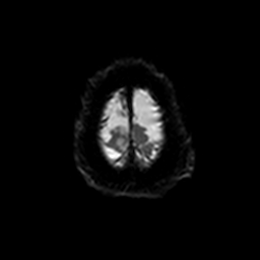
[im 94/94]
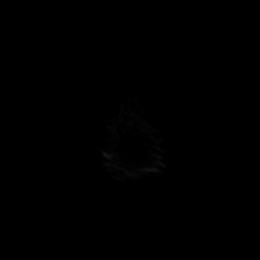

[Series 6: DWI · axial · 3.0mm · 0.88mm/px · z∈[-68,+59]mm · 5 of 47 slices shown (2 of 4)]
[im 1/47]
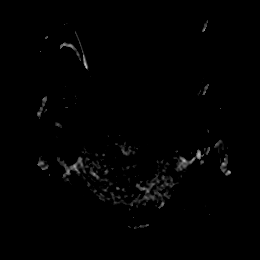
[im 12/47]
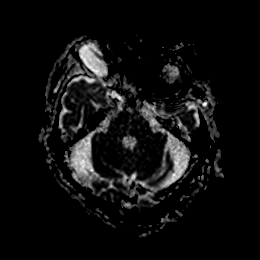
[im 24/47]
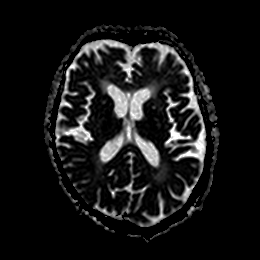
[im 35/47]
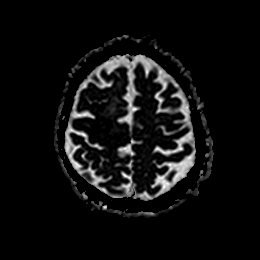
[im 47/47]
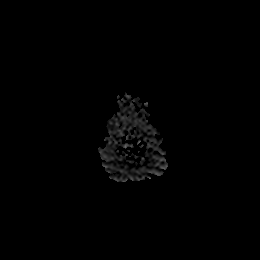

[Series 7: DWI · coronal · 4.0mm · 0.88mm/px · 6 of 68 slices shown (3 of 4)]
[im 1/68]
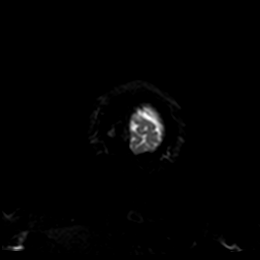
[im 14/68]
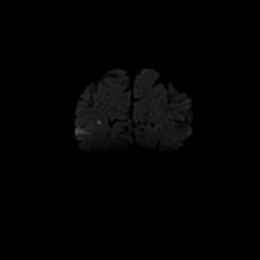
[im 27/68]
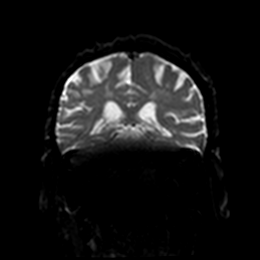
[im 41/68]
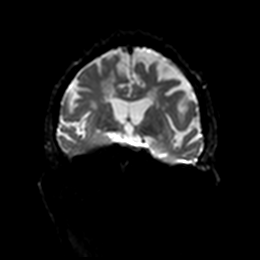
[im 54/68]
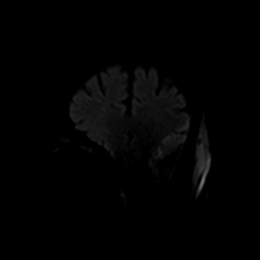
[im 68/68]
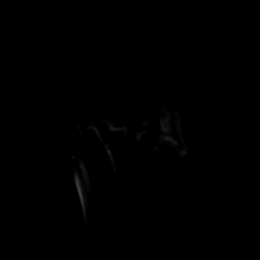

[Series 8: DWI · coronal · 4.0mm · 0.88mm/px · 3 of 34 slices shown (4 of 4)]
[im 1/34]
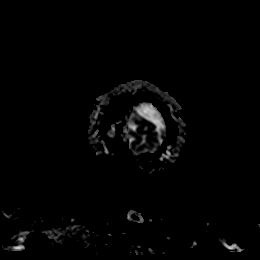
[im 17/34]
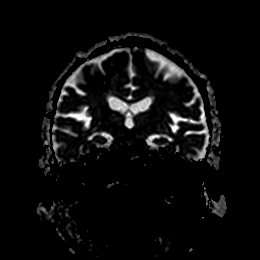
[im 34/34]
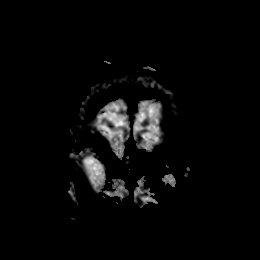

[Series 9: T1 · sagittal · 5.0mm · 0.75mm/px · 2 of 25 slices shown]
[im 1/25]
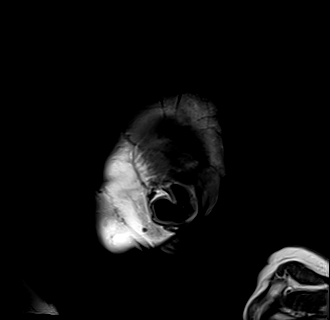
[im 25/25]
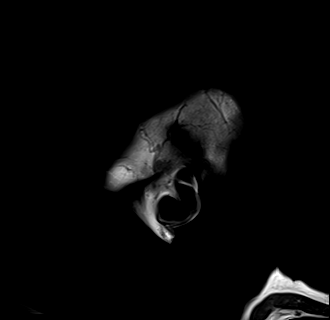

[Series 10: T2 · axial · 5.0mm · 0.72mm/px · z∈[-70,+60]mm · 2 of 25 slices shown (1 of 2)]
[im 1/25]
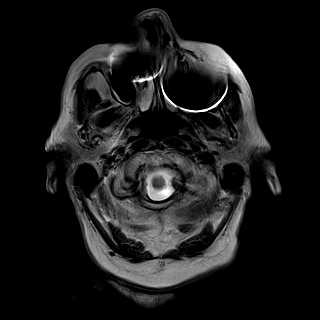
[im 25/25]
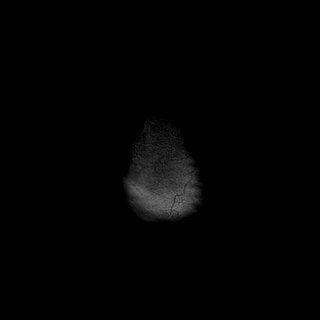

[Series 11: FLAIR · axial · 5.0mm · 0.45mm/px · z∈[-66,+63]mm · 2 of 25 slices shown]
[im 1/25]
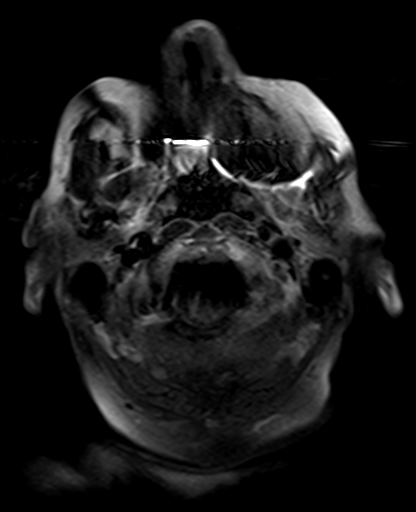
[im 25/25]
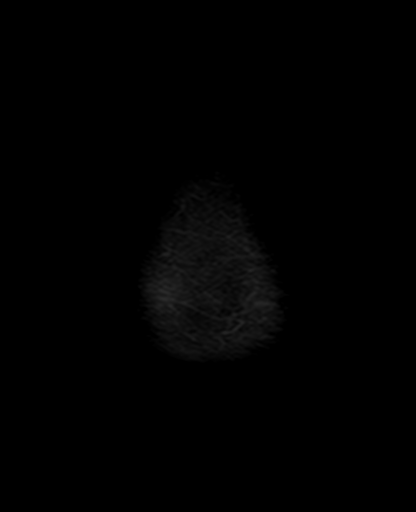

[Series 13: pha_images · axial · 3.0mm · 0.90mm/px · z∈[-81,+78]mm · 5 of 59 slices shown]
[im 1/59]
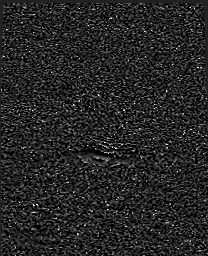
[im 15/59]
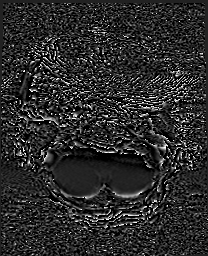
[im 30/59]
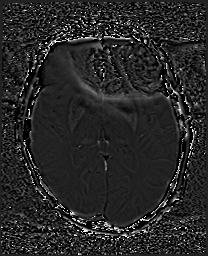
[im 44/59]
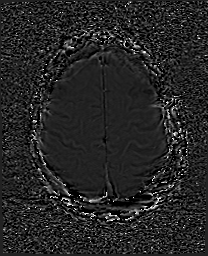
[im 59/59]
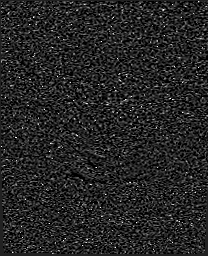

[Series 14: swi_images · axial · 3.0mm · 0.90mm/px · z∈[-81,+78]mm · 5 of 60 slices shown]
[im 1/60]
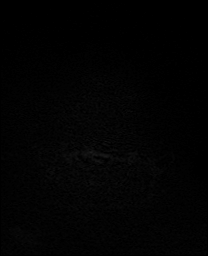
[im 15/60]
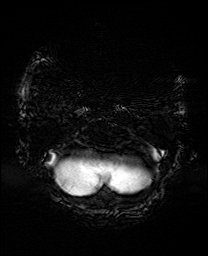
[im 30/60]
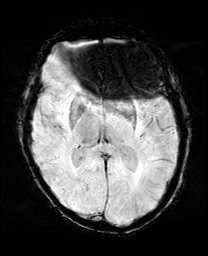
[im 45/60]
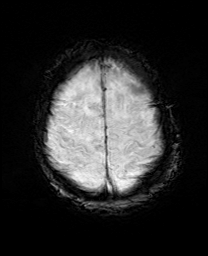
[im 60/60]
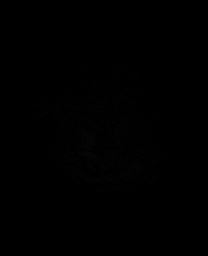

[Series 17: T2 · coronal · 5.0mm · 0.34mm/px · 3 of 29 slices shown (2 of 2)]
[im 1/29]
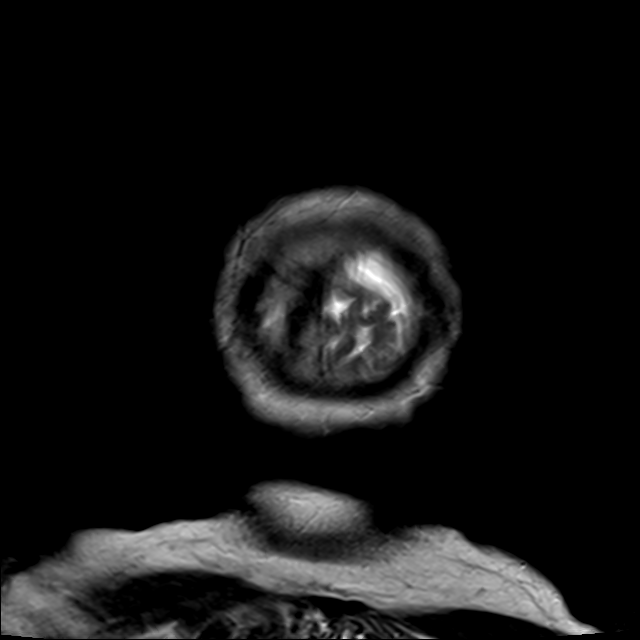
[im 15/29]
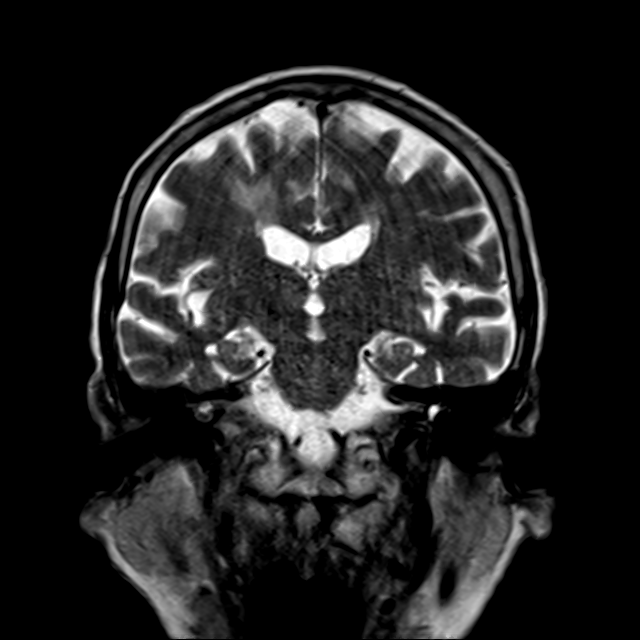
[im 29/29]
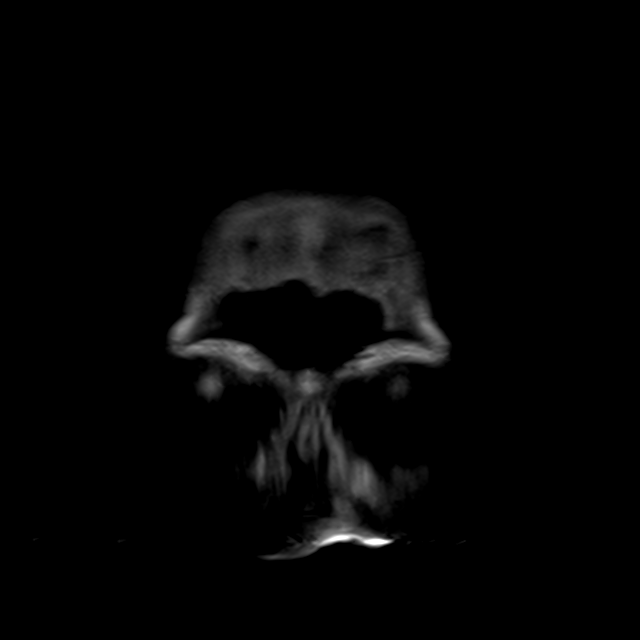

[43 of 48 positions shown; findings below may reference images not displayed]

FINDINGS: Brain: Scattered infarcts in the right cerebrum along the MCA,
distal ACA, and watershed territories. Largest confluent infarct is
at the posterior right temporal to low parietal cortex and white
matter measuring up to 3 cm. ACA territory involvement is along the
central sulcus.

No hemorrhage, hydrocephalus, or masslike finding. Chronic small
vessel ischemia in the white matter and pons. There is a line of
remote lacunar infarcts in the periventricular right frontal lobe,
likely past deep watershed injury.

Vascular: Normal flow voids

Skull and upper cervical spine: Multilevel cervical ankylosis. No
focal marrow lesion.

Sinuses/Orbits: Bilateral cataract resection
IMPRESSION: Multiple acute infarcts in the right MCA, distal ACA, and watershed
territories.

## 2020-07-13 ENCOUNTER — Telehealth: Payer: Self-pay | Admitting: Internal Medicine

## 2020-07-13 NOTE — Telephone Encounter (Signed)
Spoke to Jan. Advised her what Dr Silvio Pate said.

## 2020-07-13 NOTE — Telephone Encounter (Signed)
Let her know that I am off today but will be at [facility] tomorrow afternoon. It is always better to leave messages with concerns with the staff there, since [name] was there this morning and could have addressed any concerns she had (but I could do it tomorrow now)

## 2020-07-13 NOTE — Telephone Encounter (Signed)
Pt daugther called in wanted to know about speaking to Dr. Silvio Pate due to her mom had a stroke in May and her hand is contracted and it smells and wanted to know about what to do and it hurts.

## 2020-07-14 DIAGNOSIS — M62442 Contracture of muscle, left hand: Secondary | ICD-10-CM

## 2020-07-17 DIAGNOSIS — S61402A Unspecified open wound of left hand, initial encounter: Secondary | ICD-10-CM

## 2020-08-12 DIAGNOSIS — E43 Unspecified severe protein-calorie malnutrition: Secondary | ICD-10-CM

## 2020-08-12 DIAGNOSIS — R1084 Generalized abdominal pain: Secondary | ICD-10-CM

## 2020-08-12 DIAGNOSIS — J8410 Pulmonary fibrosis, unspecified: Secondary | ICD-10-CM

## 2020-08-12 DIAGNOSIS — I4891 Unspecified atrial fibrillation: Secondary | ICD-10-CM

## 2020-08-12 DIAGNOSIS — F0150 Vascular dementia without behavioral disturbance: Secondary | ICD-10-CM

## 2020-08-12 DIAGNOSIS — I69354 Hemiplegia and hemiparesis following cerebral infarction affecting left non-dominant side: Secondary | ICD-10-CM

## 2020-08-12 DIAGNOSIS — F39 Unspecified mood [affective] disorder: Secondary | ICD-10-CM

## 2020-08-12 DIAGNOSIS — I10 Essential (primary) hypertension: Secondary | ICD-10-CM

## 2020-12-04 DIAGNOSIS — F0150 Vascular dementia without behavioral disturbance: Secondary | ICD-10-CM | POA: Diagnosis not present

## 2020-12-04 DIAGNOSIS — J9611 Chronic respiratory failure with hypoxia: Secondary | ICD-10-CM

## 2020-12-04 DIAGNOSIS — I10 Essential (primary) hypertension: Secondary | ICD-10-CM

## 2020-12-04 DIAGNOSIS — I4891 Unspecified atrial fibrillation: Secondary | ICD-10-CM

## 2020-12-04 DIAGNOSIS — F39 Unspecified mood [affective] disorder: Secondary | ICD-10-CM | POA: Diagnosis not present

## 2020-12-04 DIAGNOSIS — I69354 Hemiplegia and hemiparesis following cerebral infarction affecting left non-dominant side: Secondary | ICD-10-CM | POA: Diagnosis not present

## 2020-12-04 DIAGNOSIS — E43 Unspecified severe protein-calorie malnutrition: Secondary | ICD-10-CM | POA: Diagnosis not present

## 2021-02-08 DIAGNOSIS — F39 Unspecified mood [affective] disorder: Secondary | ICD-10-CM | POA: Diagnosis not present

## 2021-02-08 DIAGNOSIS — E440 Moderate protein-calorie malnutrition: Secondary | ICD-10-CM | POA: Diagnosis not present

## 2021-02-08 DIAGNOSIS — F0150 Vascular dementia without behavioral disturbance: Secondary | ICD-10-CM | POA: Diagnosis not present

## 2021-02-08 DIAGNOSIS — J8410 Pulmonary fibrosis, unspecified: Secondary | ICD-10-CM

## 2021-02-08 DIAGNOSIS — G8194 Hemiplegia, unspecified affecting left nondominant side: Secondary | ICD-10-CM | POA: Diagnosis not present

## 2021-02-18 DIAGNOSIS — K047 Periapical abscess without sinus: Secondary | ICD-10-CM | POA: Diagnosis not present

## 2021-04-09 DIAGNOSIS — I10 Essential (primary) hypertension: Secondary | ICD-10-CM

## 2021-04-09 DIAGNOSIS — E43 Unspecified severe protein-calorie malnutrition: Secondary | ICD-10-CM | POA: Diagnosis not present

## 2021-04-09 DIAGNOSIS — J9611 Chronic respiratory failure with hypoxia: Secondary | ICD-10-CM

## 2021-04-09 DIAGNOSIS — F0150 Vascular dementia without behavioral disturbance: Secondary | ICD-10-CM | POA: Diagnosis not present

## 2021-04-09 DIAGNOSIS — I69354 Hemiplegia and hemiparesis following cerebral infarction affecting left non-dominant side: Secondary | ICD-10-CM

## 2021-04-09 DIAGNOSIS — J8410 Pulmonary fibrosis, unspecified: Secondary | ICD-10-CM

## 2021-04-09 DIAGNOSIS — F39 Unspecified mood [affective] disorder: Secondary | ICD-10-CM | POA: Diagnosis not present

## 2021-04-09 DIAGNOSIS — I4891 Unspecified atrial fibrillation: Secondary | ICD-10-CM | POA: Diagnosis not present

## 2021-05-16 ENCOUNTER — Emergency Department

## 2021-05-16 ENCOUNTER — Other Ambulatory Visit: Payer: Self-pay

## 2021-05-16 ENCOUNTER — Encounter: Payer: Self-pay | Admitting: Emergency Medicine

## 2021-05-16 ENCOUNTER — Emergency Department
Admission: EM | Admit: 2021-05-16 | Discharge: 2021-05-16 | Disposition: A | Discharge status: Home / Self Care | Attending: Emergency Medicine | Admitting: Emergency Medicine

## 2021-05-16 DIAGNOSIS — Z79899 Other long term (current) drug therapy: Secondary | ICD-10-CM | POA: Insufficient documentation

## 2021-05-16 DIAGNOSIS — Z85828 Personal history of other malignant neoplasm of skin: Secondary | ICD-10-CM | POA: Diagnosis not present

## 2021-05-16 DIAGNOSIS — Z7982 Long term (current) use of aspirin: Secondary | ICD-10-CM | POA: Insufficient documentation

## 2021-05-16 DIAGNOSIS — S0083XA Contusion of other part of head, initial encounter: Secondary | ICD-10-CM | POA: Insufficient documentation

## 2021-05-16 DIAGNOSIS — N3000 Acute cystitis without hematuria: Secondary | ICD-10-CM | POA: Insufficient documentation

## 2021-05-16 DIAGNOSIS — I10 Essential (primary) hypertension: Secondary | ICD-10-CM | POA: Diagnosis not present

## 2021-05-16 DIAGNOSIS — W19XXXA Unspecified fall, initial encounter: Secondary | ICD-10-CM | POA: Insufficient documentation

## 2021-05-16 DIAGNOSIS — S0990XA Unspecified injury of head, initial encounter: Secondary | ICD-10-CM | POA: Diagnosis present

## 2021-05-16 DIAGNOSIS — Y92129 Unspecified place in nursing home as the place of occurrence of the external cause: Secondary | ICD-10-CM | POA: Diagnosis not present

## 2021-05-16 LAB — CBC WITH DIFFERENTIAL/PLATELET
Abs Immature Granulocytes: 0.04 10*3/uL (ref 0.00–0.07)
Basophils Absolute: 0 10*3/uL (ref 0.0–0.1)
Basophils Relative: 1 %
Eosinophils Absolute: 0.2 10*3/uL (ref 0.0–0.5)
Eosinophils Relative: 2 %
HCT: 28.6 % — ABNORMAL LOW (ref 36.0–46.0)
Hemoglobin: 9 g/dL — ABNORMAL LOW (ref 12.0–15.0)
Immature Granulocytes: 1 %
Lymphocytes Relative: 10 %
Lymphs Abs: 0.9 10*3/uL (ref 0.7–4.0)
MCH: 28.7 pg (ref 26.0–34.0)
MCHC: 31.5 g/dL (ref 30.0–36.0)
MCV: 91.1 fL (ref 80.0–100.0)
Monocytes Absolute: 0.5 10*3/uL (ref 0.1–1.0)
Monocytes Relative: 6 %
Neutro Abs: 7.2 10*3/uL (ref 1.7–7.7)
Neutrophils Relative %: 80 %
Platelets: 227 10*3/uL (ref 150–400)
RBC: 3.14 MIL/uL — ABNORMAL LOW (ref 3.87–5.11)
RDW: 15.5 % (ref 11.5–15.5)
WBC: 8.8 10*3/uL (ref 4.0–10.5)
nRBC: 0 % (ref 0.0–0.2)

## 2021-05-16 LAB — URINALYSIS, ROUTINE W REFLEX MICROSCOPIC
Bilirubin Urine: NEGATIVE
Glucose, UA: NEGATIVE mg/dL
Hgb urine dipstick: NEGATIVE
Ketones, ur: NEGATIVE mg/dL
Nitrite: POSITIVE — AB
Protein, ur: 100 mg/dL — AB
Specific Gravity, Urine: 1.018 (ref 1.005–1.030)
WBC, UA: >50 WBC/hpf — ABNORMAL HIGH (ref 0–5)
pH: 6 (ref 5.0–8.0)

## 2021-05-16 LAB — COMPREHENSIVE METABOLIC PANEL
ALT: 10 U/L (ref 0–44)
AST: 24 U/L (ref 15–41)
Albumin: 3.4 g/dL — ABNORMAL LOW (ref 3.5–5.0)
Alkaline Phosphatase: 68 U/L (ref 38–126)
Anion gap: 8 (ref 5–15)
BUN: 21 mg/dL (ref 8–23)
CO2: 31 mmol/L (ref 22–32)
Calcium: 8.8 mg/dL — ABNORMAL LOW (ref 8.9–10.3)
Chloride: 102 mmol/L (ref 98–111)
Creatinine, Ser: 0.71 mg/dL (ref 0.44–1.00)
GFR, Estimated: >60 mL/min (ref >60)
Glucose, Bld: 110 mg/dL — ABNORMAL HIGH (ref 70–99)
Potassium: 3.8 mmol/L (ref 3.5–5.1)
Sodium: 141 mmol/L (ref 135–145)
Total Bilirubin: 0.7 mg/dL (ref 0.3–1.2)
Total Protein: 7 g/dL (ref 6.5–8.1)

## 2021-05-16 MED ORDER — CEFDINIR 300 MG PO CAPS: 300.0000 mg | ORAL_CAPSULE | Freq: Two times a day (BID) | ORAL | 0 refills | Status: AC

## 2021-05-16 MED ORDER — CEFTRIAXONE SODIUM 1 G IJ SOLR
1.0000 g | Freq: Once | INTRAMUSCULAR | Status: AC
  Administered 2021-05-16: 21:00:00 1 g via INTRAMUSCULAR
  Filled 2021-05-16: qty 10

## 2021-05-16 NOTE — ED Triage Notes (Signed)
Pt in via EMS from [facility] with c/o unwitnessed fall. Pt was found by staff laying in floor. Pt with hematoma to left side of forehead. Pt take 81mg  of asa daily. Pt is under hospice care. Unknown if LOC. Pt has dementia and is only oriented to self. 98.3 temp, CBG 143, 197/122. Pt on 2L of oxygen at baseline

## 2021-05-16 NOTE — ED Notes (Signed)
Report called to facility. Pt assisted to stretcher with EMS

## 2021-05-16 NOTE — ED Triage Notes (Addendum)
Pt via EMS from [facility], pt had a unwitnessed fall. Pt has a hematoma to L side of her forehead and states that her L knee is hurting. No blood thinners. Unknown LOC. Pt is demented and only oriented to self only. Pt is hospice pt. Pt on 2L [place] chronically.

## 2021-05-16 NOTE — ED Notes (Signed)
Pt transferred from recliner to hospital bed. Pt in/out cathed for urine. Pt cleaned and new brief applied. Pt in triage area in bed with monitor in place.

## 2021-05-16 NOTE — ED Provider Notes (Signed)
°  Emergency Medicine Provider Triage Evaluation Note  Christy Waters , a 87 y.o.female,  was evaluated in triage.  Pt complains of injuries from fall.  Patient was brought in by EMS from [facility].  She had an unwitnessed fall.  She has a hematoma to the left side of her forehead and states that her left knee is hurting.  Denies being on blood thinners.  Unknown LOC.  Denies pain anywhere else.   Review of Systems  Positive: Headache, left knee pain. Negative: Denies fever, chest pain, vomiting  Physical Exam   Vitals:   05/16/21 1800  BP: (!) 147/106  Pulse: 91  Resp: 20  Temp: 98.4 F (36.9 C)  SpO2: 92%   Gen:   Awake, no distress   Resp:  Normal effort  MSK:   Moves extremities without difficulty  Other:    Medical Decision Making  Given the patient's initial medical screening exam, the following diagnostic evaluation has been ordered. The patient will be placed in the appropriate treatment space, once one is available, to complete the evaluation and treatment. I have discussed the plan of care with the patient and I have advised the patient that an ED physician or mid-level practitioner will reevaluate their condition after the test results have been received, as the results may give them additional insight into the type of treatment they may need.    Diagnostics: Head CT, neck CT, left knee x-ray.  Treatments: none immediately   Teodoro Spray, [place] 05/16/21 1806    Lucrezia Starch, MD 05/16/21 [phone]

## 2021-05-16 NOTE — ED Provider Notes (Signed)
[facility] Emergency Department Provider Note  ____________________________________________   Event Date/Time   First MD Initiated Contact with Patient 05/16/21 2106     (approximate)  I have reviewed the triage vital signs and the nursing notes.   HISTORY  Chief Complaint Fall    HPI Christy Waters is a 87 y.o. female  here with fall. Pt reports that she fell while at her SNF today. Reports she just tripped. Denies any pain or symptoms related to this. She is on Hospice. Reports she now has some minimal suprapubic discomfort but denies any n/v, flank pain. Denies any known fevers, chills. Reports she now feels back to her baseline. She states her hands hurt from trying to pull herself up but o/w denies any pain from the fall. No HA, neck pain, numbness, or weakness.        Past Medical History:  Diagnosis Date   Anemia    distant past   Arthritis    fingers, hips   Cancer ([place])    skin cancer; some spots on left side of face; been 5 years since last treated;    Headache    History of echocardiogram    a. TTE 04/06/17: EF > 55%, no RWMA, no LA mass, severe TR, RV systolic dysfunction, elevated PASP; b. TTE 04/2017: EF 65-70%, no RWMA, Gr1DD, calcified mitral annulus w/ mild MR, mild to mod dilated LA w/ irregular, echodense material in the LA incompletely visualized, possibly representing artifact vs thrombus, or intracardiac mass, rec TEE, mod TR, mildly dilated RV, inc PASP   History of nuclear stress test    a. 12/18: no sig ischemia, low risk   HLD (hyperlipidemia)    Left atrial mass    a. see echo from 04/2017 - ? mass vs LA thrombus in the setting of PAF -->Eliquis.   Osteoporosis    PAF (paroxysmal atrial fibrillation) ([place])    a. diagnosed 05/10/2017; b. CHADS2VASc --> 3 (age x 2, female)--> Eliquis 2.5 (age/wt).    Patient Active Problem List   Diagnosis Date Noted   Hospice care patient 03/18/2020   Acute CVA  (cerebrovascular accident) ([place]) 03/15/2020   PAF (paroxysmal atrial fibrillation) ([place])    Stroke ([place]) 10/28/2019   Hypertensive urgency 09/16/2019   Syncope, vasovagal 09/15/2019   Syncope and collapse 09/15/2019   Acute lower UTI 09/15/2019   Pelvic pain in female 03/20/2018   Cor pulmonale ([place]) 09/19/2017   Low serum vitamin D 06/19/2017   History of atrial fibrillation 06/19/2017   Chest pain 05/10/2017   Moderate episode of recurrent major depressive disorder ([place]) 04/04/2017   Adult idiopathic generalized osteoporosis 04/13/2016   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   Irritable bowel syndrome with diarrhea 05/20/2014   Abdominal pain, generalized 04/01/2014   Atherosclerosis of aorta ([place]) 03/05/2014   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   Idiopathic peripheral neuropathy 03/05/2014   Abdominal pain of unknown cause 02/06/2014   MI (mitral incompetence) 02/06/2014   Stress incontinence 02/06/2014   Fibrosis of lung ([place]) 10/03/2013   Generalized OA 10/03/2013   HLD (hyperlipidemia) 10/03/2013   BP (high blood pressure) 10/03/2013   OP (osteoporosis) 10/03/2013   Beat, premature ventricular 10/03/2013   Ventricular premature depolarization 10/03/2013    Past Surgical History:  Procedure Laterality Date   CATARACT EXTRACTION W/PHACO Left 09/14/2015   Procedure: CATARACT EXTRACTION PHACO AND INTRAOCULAR LENS PLACEMENT (IOC);  Surgeon: Ronnell Freshwater, MD;  Location: [facility];  Service: Ophthalmology;  Laterality:  Left;  MALYUGIN   EYE SURGERY Right    cataract surgery schedule for Easter Monday 2017;     Prior to Admission medications   Medication Sig Start Date End Date Taking? Authorizing Provider  cefdinir (OMNICEF) 300 MG capsule Take 1 capsule (300 mg total) by mouth 2 (two) times daily for 7 days. 05/16/21 05/23/21 Yes Duffy Bruce, MD  acetaminophen (TYLENOL) 325 MG tablet Take 650 mg by mouth every 4 (four) hours as needed for mild pain or  moderate pain.    [provider]  aspirin 81 MG chewable tablet Chew 1 tablet (81 mg total) by mouth daily. 03/18/20   Lavina Hamman, MD  bisacodyl (DULCOLAX) 10 MG suppository Place 10 mg rectally as needed for moderate constipation.    [provider]  Cholecalciferol (VITAMIN D3) 50 MCG (2000 UT) TABS Take 2,000 Units by mouth at bedtime.     [provider]  clopidogrel (PLAVIX) 75 MG tablet Take 75 mg by mouth daily.    [provider]  diclofenac Sodium (VOLTAREN) 1 % GEL Apply 1 g topically every 6 (six) hours as needed (pain). Apply to left hand    [provider]  feeding supplement (ENSURE ENLIVE / ENSURE PLUS) LIQD Take 237 mLs by mouth 2 (two) times daily between meals. 03/18/20   Lavina Hamman, MD  fexofenadine (ALLEGRA) 180 MG tablet Take 180 mg by mouth daily.    [provider]  LUMIGAN 0.01 % SOLN Place 1 drop into both eyes at bedtime.     [provider]  miconazole (MICOTIN) 2 % cream Apply 1 application topically 2 (two) times daily. (affected area on back)    [provider]  mirtazapine (REMERON) 7.5 MG tablet Take 7.5 mg by mouth at bedtime.    [provider]  morphine (ROXANOL) 20 MG/ML concentrated solution Take 0.25 mLs (5 mg total) by mouth every 2 (two) hours as needed for severe pain (dyspnea). 03/18/20   Lavina Hamman, MD  nystatin (MYCOSTATIN/NYSTOP) powder Apply topically 3 (three) times daily. 03/18/20   Lavina Hamman, MD  olmesartan (BENICAR) 5 MG tablet Take 5 mg by mouth at bedtime.     [provider]  ondansetron (ZOFRAN) 4 MG tablet Take 4 mg by mouth every 8 (eight) hours as needed for nausea or vomiting.    [provider]  PARoxetine (PAXIL) 20 MG tablet Take 20 mg by mouth daily.     [provider]    Allergies Azithromycin, Clindamycin, Clindamycin/lincomycin, Hyoscyamine, Lincomycin, Maxitrol [neomycin-polymyxin-dexameth], Other,  Timolol, Codeine, and Penicillins  Family History  Problem Relation Age of Onset   CAD Mother    Arrhythmia Mother    Liver cancer Father     Social History Social History   Tobacco Use   Smoking status: Never   Smokeless tobacco: Never  Vaping Use   Vaping Use: Never used  Substance Use Topics   Alcohol use: No   Drug use: No    Review of Systems  Review of Systems  Constitutional:  Negative for chills, fatigue and fever.  HENT:  Negative for sore throat.   Respiratory:  Negative for shortness of breath.   Cardiovascular:  Negative for chest pain.  Gastrointestinal:  Negative for abdominal pain.  Genitourinary:  Negative for flank pain.  Musculoskeletal:  Positive for arthralgias. Negative for neck pain.  Skin:  Negative for rash and wound.  Allergic/Immunologic: Negative for immunocompromised state.  Neurological:  Negative  for weakness and numbness.  Hematological:  Does not bruise/bleed easily.  All other systems reviewed and are negative.   ____________________________________________  PHYSICAL EXAM:      VITAL SIGNS: ED Triage Vitals  Enc Vitals Group     BP 05/16/21 1800 (!) 147/106     Pulse Rate 05/16/21 1800 91     Resp 05/16/21 1800 20     Temp 05/16/21 1800 98.4 F (36.9 C)     Temp Source 05/16/21 1800 Oral     SpO2 05/16/21 1800 92 %     Weight 05/16/21 1801 110 lb (49.9 kg)     Height 05/16/21 1801 5' (1.524 m)     Head Circumference --      Peak Flow --      Pain Score --      Pain Loc --      Pain Edu? --      Excl. in [place]? --      Physical Exam Vitals and nursing note reviewed.  Constitutional:      General: She is not in acute distress.    Appearance: She is well-developed.  HENT:     Head: Normocephalic and atraumatic.     Comments: Small area of bruising to left forehead, no deformity. Eyes:     Conjunctiva/sclera: Conjunctivae normal.  Neck:     Comments: No midline or paraspinal TTP Cardiovascular:     Rate and Rhythm:  Normal rate and regular rhythm.     Heart sounds: Normal heart sounds.  Pulmonary:     Effort: Pulmonary effort is normal. No respiratory distress.     Breath sounds: No wheezing.  Abdominal:     General: There is no distension.     Comments: No suprapubic or other tenderness. No CVAT bilaterally.  Musculoskeletal:     Cervical back: Neck supple.     Comments: Chronic arthritic changes b/l hands. No other bony TTP or deformity.  Skin:    General: Skin is warm.     Capillary Refill: Capillary refill takes less than 2 seconds.     Findings: No rash.  Neurological:     Mental Status: She is alert and oriented to person, place, and time.     Motor: No abnormal muscle tone.      ____________________________________________   LABS (all labs ordered are listed, but only abnormal results are displayed)  Labs Reviewed  CBC WITH DIFFERENTIAL/PLATELET - Abnormal; Notable for the following components:      Result Value   RBC 3.14 (*)    Hemoglobin 9.0 (*)    HCT 28.6 (*)    All other components within normal limits  COMPREHENSIVE METABOLIC PANEL - Abnormal; Notable for the following components:   Glucose, Bld 110 (*)    Calcium 8.8 (*)    Albumin 3.4 (*)    All other components within normal limits  URINALYSIS, ROUTINE W REFLEX MICROSCOPIC - Abnormal; Notable for the following components:   Color, Urine YELLOW (*)    APPearance CLOUDY (*)    Protein, ur 100 (*)    Nitrite POSITIVE (*)    Leukocytes,Ua LARGE (*)    WBC, UA >50 (*)    Bacteria, UA MANY (*)    All other components within normal limits  RESP PANEL BY RT-PCR (FLU A&B, COVID) [id number]    ____________________________________________  EKG: Normal sinus rhythm, VR 90. PR 182, QRS 70, QTc 498. No acute St elevations or depressions. No ischemia or infarct. ________________________________________  RADIOLOGY All imaging, including plain films, CT scans, and ultrasounds, independently reviewed by me, and interpretations  confirmed via formal radiology reads.  ED MD interpretation:   CT Head: NAICA CT Spine Cervical: Negative DG Knee Left: negative  Official radiology report(s): CT Head Wo Contrast  Result Date: 05/16/2021 CLINICAL DATA:  Falls EXAM: CT HEAD WITHOUT CONTRAST CT CERVICAL SPINE WITHOUT CONTRAST TECHNIQUE: Multidetector CT imaging of the head and cervical spine was performed following the standard protocol without intravenous contrast. Multiplanar CT image reconstructions of the cervical spine were also generated. COMPARISON:  03/15/2020 CT head, 12/07/2019 CT head and cervical spine. FINDINGS: CT HEAD FINDINGS Brain: No acute hemorrhage, mass, mass effect, or midline shift. No new area of hypodensity is seen. Redemonstrated sequela of remote right frontal and right temporoparietal infarcts. Periventricular white matter changes, likely the sequela of chronic small vessel ischemic disease. Cerebral atrophy, not unexpected for age, with ex vacuo dilatation of the ventricles. No hydrocephalus or extra-axial collection. Vascular: No hyperdense vessel. Skull: No acute osseous abnormality. Sinuses/Orbits: Mucosal thickening in the ethmoid air cells and right-greater-than-left sphenoid sinus. Status post bilateral lens replacements. Other: The mastoids are well aerated. CT CERVICAL SPINE FINDINGS Alignment: Normal. Skull base and vertebrae: No acute fracture. No primary bone lesion or focal pathologic process. Degenerative changes with osseous fusion of C3-C5, both at the vertebral bodies and facets. Additional partially imaged partial osseous fusion of T3-T6. Soft tissues and spinal canal: No prevertebral fluid or swelling. No visible canal hematoma. Disc levels: Unchanged multilevel degenerative changes, most pronounced at C5-C6 and C6-C7. No significant spinal canal stenosis. Upper chest: Aortic atherosclerosis.  No focal pulmonary opacity. Other: Advanced degenerative changes in the bilateral temporomandibular  joints. IMPRESSION: 1.  No acute intracranial process. 2.  No acute fracture or traumatic listhesis in the cervical spine. Electronically Signed   By: Merilyn Baba M.D.   On: 05/16/2021 19:23   CT Cervical Spine Wo Contrast  Result Date: 05/16/2021 CLINICAL DATA:  Falls EXAM: CT HEAD WITHOUT CONTRAST CT CERVICAL SPINE WITHOUT CONTRAST TECHNIQUE: Multidetector CT imaging of the head and cervical spine was performed following the standard protocol without intravenous contrast. Multiplanar CT image reconstructions of the cervical spine were also generated. COMPARISON:  03/15/2020 CT head, 12/07/2019 CT head and cervical spine. FINDINGS: CT HEAD FINDINGS Brain: No acute hemorrhage, mass, mass effect, or midline shift. No new area of hypodensity is seen. Redemonstrated sequela of remote right frontal and right temporoparietal infarcts. Periventricular white matter changes, likely the sequela of chronic small vessel ischemic disease. Cerebral atrophy, not unexpected for age, with ex vacuo dilatation of the ventricles. No hydrocephalus or extra-axial collection. Vascular: No hyperdense vessel. Skull: No acute osseous abnormality. Sinuses/Orbits: Mucosal thickening in the ethmoid air cells and right-greater-than-left sphenoid sinus. Status post bilateral lens replacements. Other: The mastoids are well aerated. CT CERVICAL SPINE FINDINGS Alignment: Normal. Skull base and vertebrae: No acute fracture. No primary bone lesion or focal pathologic process. Degenerative changes with osseous fusion of C3-C5, both at the vertebral bodies and facets. Additional partially imaged partial osseous fusion of T3-T6. Soft tissues and spinal canal: No prevertebral fluid or swelling. No visible canal hematoma. Disc levels: Unchanged multilevel degenerative changes, most pronounced at C5-C6 and C6-C7. No significant spinal canal stenosis. Upper chest: Aortic atherosclerosis.  No focal pulmonary opacity. Other: Advanced degenerative  changes in the bilateral temporomandibular joints. IMPRESSION: 1.  No acute intracranial process. 2.  No acute fracture or traumatic listhesis in the cervical spine. Electronically Signed  By: Merilyn Baba M.D.   On: 05/16/2021 19:23   DG Knee Complete 4 Views Left  Result Date: 05/16/2021 CLINICAL DATA:  Recent fall with left knee pain, initial encounter EXAM: LEFT KNEE - COMPLETE 4+ VIEW COMPARISON:  None. FINDINGS: Degenerative changes are noted most prominent in the lateral joint space. No joint effusion is seen. No acute fracture or dislocation is noted. IMPRESSION: Degenerative change without acute abnormality. Electronically Signed   By: Inez Catalina M.D.   On: 05/16/2021 20:00    ____________________________________________  PROCEDURES   Procedure(s) performed (including Critical Care):  Procedures  ____________________________________________  INITIAL IMPRESSION / MDM / [place] / ED COURSE  As part of my medical decision making, I reviewed the following data within the [facility] notes reviewed and incorporated, Old chart reviewed, Notes from prior ED visits, and [place] Controlled Substance Database       *Christy Waters was evaluated in Emergency Department on 05/16/2021 for the symptoms described in the history of present illness. She was evaluated in the context of the global COVID-19 pandemic, which necessitated consideration that the patient might be at risk for infection with the SARS-CoV-2 virus that causes COVID-19. Institutional protocols and algorithms that pertain to the evaluation of patients at risk for COVID-19 are in a state of rapid change based on information released by regulatory bodies including the CDC and federal and state organizations. These policies and algorithms were followed during the patient's care in the ED.  Some ED evaluations and interventions may be delayed as a result of limited staffing during the  pandemic.*     Medical Decision Making:  87 yo F with PMHx as above, on Hospice, here with fall. Unclear trigger but pt denies any acute medical complaints beyond mild SP pain. No abd TTP, CVAT on exam. She is aox3 here with no focal deficits. CT Head/C-Spine obtained, reviewed, show no acute abnormality. Had some mild knee pain - plain films neg. No ligamentous laxity on exam. Labs are o/w very reassuring - no leukocytosis and lytes are wnl. UA does show +UTI. H/o E Coli UTI on last admission.  Will tx with Rocephin IM, supportive care. Given neg imaging and reassuring labs, at baseline mental status, and on Hospice, will d/c with outpt follow-up and return precautions.  ____________________________________________  FINAL CLINICAL IMPRESSION(S) / ED DIAGNOSES  Final diagnoses:  Fall, initial encounter  Acute cystitis without hematuria     MEDICATIONS GIVEN DURING THIS VISIT:  Medications  cefTRIAXone (ROCEPHIN) injection 1 g (1 g Intramuscular Given 05/16/21 2124)     ED Discharge Orders          Ordered    cefdinir (OMNICEF) 300 MG capsule  2 times daily        05/16/21 2131             Note:  This document was prepared using Dragon voice recognition software and may include unintentional dictation errors.   Duffy Bruce, MD 05/16/21 2258

## 2021-05-16 NOTE — ED Notes (Signed)
[facility] to transport to [facility]

## 2021-05-17 DIAGNOSIS — S0083XA Contusion of other part of head, initial encounter: Secondary | ICD-10-CM | POA: Diagnosis not present

## 2021-06-09 DIAGNOSIS — B351 Tinea unguium: Secondary | ICD-10-CM

## 2021-06-14 DIAGNOSIS — J8410 Pulmonary fibrosis, unspecified: Secondary | ICD-10-CM

## 2021-06-14 DIAGNOSIS — J9611 Chronic respiratory failure with hypoxia: Secondary | ICD-10-CM

## 2021-06-14 DIAGNOSIS — F39 Unspecified mood [affective] disorder: Secondary | ICD-10-CM | POA: Diagnosis not present

## 2021-06-14 DIAGNOSIS — F0150 Vascular dementia without behavioral disturbance: Secondary | ICD-10-CM | POA: Diagnosis not present

## 2021-06-14 DIAGNOSIS — E440 Moderate protein-calorie malnutrition: Secondary | ICD-10-CM | POA: Diagnosis not present

## 2021-06-14 DIAGNOSIS — G8191 Hemiplegia, unspecified affecting right dominant side: Secondary | ICD-10-CM | POA: Diagnosis not present

## 2021-06-18 ENCOUNTER — Emergency Department
Admission: EM | Admit: 2021-06-18 | Discharge: 2021-06-18 | Disposition: A | Discharge status: Skilled Nursing Facility | Payer: Medicare Other | Attending: Emergency Medicine | Admitting: Emergency Medicine

## 2021-06-18 ENCOUNTER — Emergency Department: Payer: Medicare Other

## 2021-06-18 ENCOUNTER — Other Ambulatory Visit: Payer: Self-pay

## 2021-06-18 DIAGNOSIS — R079 Chest pain, unspecified: Secondary | ICD-10-CM | POA: Diagnosis present

## 2021-06-18 DIAGNOSIS — R0789 Other chest pain: Secondary | ICD-10-CM | POA: Insufficient documentation

## 2021-06-18 LAB — CBC
HCT: 33.4 % — ABNORMAL LOW (ref 36.0–46.0)
Hemoglobin: 10.3 g/dL — ABNORMAL LOW (ref 12.0–15.0)
MCH: 28.2 pg (ref 26.0–34.0)
MCHC: 30.8 g/dL (ref 30.0–36.0)
MCV: 91.5 fL (ref 80.0–100.0)
Platelets: 240 10*3/uL (ref 150–400)
RBC: 3.65 MIL/uL — ABNORMAL LOW (ref 3.87–5.11)
RDW: 16 % — ABNORMAL HIGH (ref 11.5–15.5)
WBC: 5.8 10*3/uL (ref 4.0–10.5)
nRBC: 0 % (ref 0.0–0.2)

## 2021-06-18 LAB — BASIC METABOLIC PANEL
Anion gap: 7 (ref 5–15)
BUN: 15 mg/dL (ref 8–23)
CO2: 29 mmol/L (ref 22–32)
Calcium: 9 mg/dL (ref 8.9–10.3)
Chloride: 104 mmol/L (ref 98–111)
Creatinine, Ser: 0.71 mg/dL (ref 0.44–1.00)
GFR, Estimated: >60 mL/min (ref >60)
Glucose, Bld: 91 mg/dL (ref 70–99)
Potassium: 3.6 mmol/L (ref 3.5–5.1)
Sodium: 140 mmol/L (ref 135–145)

## 2021-06-18 LAB — TROPONIN I (HIGH SENSITIVITY)
Troponin I (High Sensitivity): 17 ng/L (ref <18)
Troponin I (High Sensitivity): 15 ng/L (ref <18)

## 2021-06-18 NOTE — ED Provider Notes (Signed)
[facility] Provider Note    Event Date/Time   First MD Initiated Contact with Patient 06/18/21 1341     (approximate)   History   Chest Pain   HPI  Christy Waters is a 88 y.o. female who presents to the ED for evaluation of Chest Pain   I reviewed discharge summary from medical admission and October 2021.  He has a history of stroke A. fib without anticoagulation, HLD.  She is a DNR and resides at a local SNF.  Patient presents to the ED for evaluation of chest pain.  She reports having chest pain yesterday and feeling fine now.  History limited due to her disorientation and dementia.  Has no complaints.   Physical Exam   Triage Vital Signs: ED Triage Vitals [06/18/21 1117]  Enc Vitals Group     BP 109/67     Pulse Rate 83     Resp (!) 22     Temp 98 F (36.7 C)     Temp Source Oral     SpO2 93 %     Weight      Height      Head Circumference      Peak Flow      Pain Score      Pain Loc      Pain Edu?      Excl. in [place]?     Most recent vital signs: Vitals:   06/18/21 1415 06/18/21 1500  BP:  (!) 149/88  Pulse: (!) 53   Resp: (!) 22 (!) 22  Temp:    SpO2: 99%     General: Awake, no distress.  CV:  Good peripheral perfusion.  Resp:  Normal effort.  Abd:  No distention.  MSK:  No deformity noted.  Neuro:  No acute deficits appreciated. Other:     ED Results / Procedures / Treatments   Labs (all labs ordered are listed, but only abnormal results are displayed) Labs Reviewed  CBC - Abnormal; Notable for the following components:      Result Value   RBC 3.65 (*)    Hemoglobin 10.3 (*)    HCT 33.4 (*)    RDW 16.0 (*)    All other components within normal limits  BASIC METABOLIC PANEL  TROPONIN I (HIGH SENSITIVITY)  TROPONIN I (HIGH SENSITIVITY)    EKG  Irregular narrow complex rhythm with a rate of 113 bpm.  Normal axis and intervals.  No STEMI.  RADIOLOGY CXR reviewed by me without evidence of acute  cardiopulmonary pathology.   Official radiology report(s): DG Chest 2 View  Result Date: 06/18/2021 CLINICAL DATA:  Abdominal pain. EXAM: CHEST - 2 VIEW COMPARISON:  Chest radiograph 03/15/2020. FINDINGS: Cardiac contours upper limits of normal. Tortuosity thoracic aorta. No large area pulmonary consolidation. Emphysematous change. Small bilateral pleural effusions. Thoracic spine degenerative changes. IMPRESSION: No acute cardiopulmonary process.  Small bilateral effusions. Electronically Signed   By: Lovey Newcomer M.D.   On: 06/18/2021 12:10    PROCEDURES and INTERVENTIONS:  .1-3 Lead EKG Interpretation Performed by: Vladimir Crofts, MD Authorized by: Vladimir Crofts, MD     Interpretation: normal     ECG rate:  60   ECG rate assessment: normal     Rhythm: sinus rhythm     Ectopy: none     Conduction: normal    Medications - No data to display   IMPRESSION / MDM / [place] / ED COURSE  I  reviewed the triage vital signs and the nursing notes.  88 year old woman presents to the ED for evaluation of possible chest pain with a benign work-up and suitable for return to outpatient management with hospice.  No signs of ACS, PTX or significant acute derangements.  She has no complaints.      FINAL CLINICAL IMPRESSION(S) / ED DIAGNOSES   Final diagnoses:  Other chest pain     Rx / DC Orders   ED Discharge Orders     None        Note:  This document was prepared using Dragon voice recognition software and may include unintentional dictation errors.   Vladimir Crofts, MD 06/18/21 1536

## 2021-06-18 NOTE — ED Triage Notes (Signed)
Pt comes into the ED via EMS from [facility] with c/o abd pain that has progressed into chest pain today,   117/80 HR90-120/afib 98% on 3L [place] continuous  ASA 324mg  given today

## 2021-06-18 NOTE — Progress Notes (Signed)
[facility] Liaison note: ED visit note  Patient is currently followed by [facility] hosp[facility] at [facility] The facility sent her to the [facility] ED this morning for evaluation of chest pain. Hospice was not notified prior to transport. Patient seen lying on the ED stretcher, alert. She denied pain when asked in several ways. Writer spoke on the telephone to patient's daughter Katharine Look. She reports she would like for patient to return to [facility], no invention needed. Hospital care team notified. Hospice team notified of pending return and will provide a follow up visit. Thank you. Flo Shanks BSN, RN, [place]  [phone]

## 2021-06-18 NOTE — ED Notes (Signed)
Report was given to Production manager at [facility].

## 2021-06-18 NOTE — ED Notes (Signed)
See triage note, pt cannot state why she is here. Disoriented to situation, place and time. Denies pain. Stretcher locked in lowest position.

## 2021-06-21 DIAGNOSIS — R079 Chest pain, unspecified: Secondary | ICD-10-CM | POA: Diagnosis not present

## 2021-08-04 DIAGNOSIS — I10 Essential (primary) hypertension: Secondary | ICD-10-CM

## 2021-08-04 DIAGNOSIS — J8410 Pulmonary fibrosis, unspecified: Secondary | ICD-10-CM

## 2021-08-04 DIAGNOSIS — J9611 Chronic respiratory failure with hypoxia: Secondary | ICD-10-CM

## 2021-08-04 DIAGNOSIS — F39 Unspecified mood [affective] disorder: Secondary | ICD-10-CM | POA: Diagnosis not present

## 2021-08-04 DIAGNOSIS — I69354 Hemiplegia and hemiparesis following cerebral infarction affecting left non-dominant side: Secondary | ICD-10-CM

## 2021-08-04 DIAGNOSIS — E43 Unspecified severe protein-calorie malnutrition: Secondary | ICD-10-CM | POA: Diagnosis not present

## 2021-08-04 DIAGNOSIS — K219 Gastro-esophageal reflux disease without esophagitis: Secondary | ICD-10-CM

## 2021-08-04 DIAGNOSIS — F0150 Vascular dementia without behavioral disturbance: Secondary | ICD-10-CM | POA: Diagnosis not present

## 2021-08-04 DIAGNOSIS — I4891 Unspecified atrial fibrillation: Secondary | ICD-10-CM | POA: Diagnosis not present

## 2021-08-06 DIAGNOSIS — L03113 Cellulitis of right upper limb: Secondary | ICD-10-CM | POA: Diagnosis not present

## 2021-09-30 DIAGNOSIS — F39 Unspecified mood [affective] disorder: Secondary | ICD-10-CM | POA: Diagnosis not present

## 2021-09-30 DIAGNOSIS — G8191 Hemiplegia, unspecified affecting right dominant side: Secondary | ICD-10-CM | POA: Diagnosis not present

## 2021-09-30 DIAGNOSIS — J9611 Chronic respiratory failure with hypoxia: Secondary | ICD-10-CM

## 2021-09-30 DIAGNOSIS — E440 Moderate protein-calorie malnutrition: Secondary | ICD-10-CM | POA: Diagnosis not present

## 2021-09-30 DIAGNOSIS — F0150 Vascular dementia without behavioral disturbance: Secondary | ICD-10-CM | POA: Diagnosis not present

## 2021-09-30 DIAGNOSIS — D219 Benign neoplasm of connective and other soft tissue, unspecified: Secondary | ICD-10-CM

## 2021-09-30 DIAGNOSIS — J8410 Pulmonary fibrosis, unspecified: Secondary | ICD-10-CM

## 2021-12-15 DIAGNOSIS — K219 Gastro-esophageal reflux disease without esophagitis: Secondary | ICD-10-CM | POA: Diagnosis not present

## 2021-12-15 DIAGNOSIS — I4891 Unspecified atrial fibrillation: Secondary | ICD-10-CM

## 2021-12-15 DIAGNOSIS — I69354 Hemiplegia and hemiparesis following cerebral infarction affecting left non-dominant side: Secondary | ICD-10-CM | POA: Diagnosis not present

## 2021-12-15 DIAGNOSIS — F39 Unspecified mood [affective] disorder: Secondary | ICD-10-CM | POA: Diagnosis not present

## 2021-12-15 DIAGNOSIS — J9611 Chronic respiratory failure with hypoxia: Secondary | ICD-10-CM

## 2021-12-15 DIAGNOSIS — I10 Essential (primary) hypertension: Secondary | ICD-10-CM

## 2021-12-15 DIAGNOSIS — E43 Unspecified severe protein-calorie malnutrition: Secondary | ICD-10-CM | POA: Diagnosis not present

## 2021-12-15 DIAGNOSIS — J8410 Pulmonary fibrosis, unspecified: Secondary | ICD-10-CM

## 2021-12-15 DIAGNOSIS — F0150 Vascular dementia without behavioral disturbance: Secondary | ICD-10-CM

## 2022-02-10 DIAGNOSIS — F0150 Vascular dementia without behavioral disturbance: Secondary | ICD-10-CM | POA: Diagnosis not present

## 2022-02-10 DIAGNOSIS — J8410 Pulmonary fibrosis, unspecified: Secondary | ICD-10-CM

## 2022-02-10 DIAGNOSIS — K219 Gastro-esophageal reflux disease without esophagitis: Secondary | ICD-10-CM

## 2022-02-10 DIAGNOSIS — F39 Unspecified mood [affective] disorder: Secondary | ICD-10-CM | POA: Diagnosis not present

## 2022-02-10 DIAGNOSIS — G8194 Hemiplegia, unspecified affecting left nondominant side: Secondary | ICD-10-CM | POA: Diagnosis not present

## 2022-02-10 DIAGNOSIS — E440 Moderate protein-calorie malnutrition: Secondary | ICD-10-CM | POA: Diagnosis not present

## 2022-02-10 DIAGNOSIS — J9611 Chronic respiratory failure with hypoxia: Secondary | ICD-10-CM

## 2022-04-08 ENCOUNTER — Encounter: Payer: Self-pay | Admitting: Student

## 2022-04-08 ENCOUNTER — Non-Acute Institutional Stay (SKILLED_NURSING_FACILITY): Admitting: Student

## 2022-04-08 DIAGNOSIS — I639 Cerebral infarction, unspecified: Secondary | ICD-10-CM

## 2022-04-08 DIAGNOSIS — E46 Unspecified protein-calorie malnutrition: Secondary | ICD-10-CM

## 2022-04-08 DIAGNOSIS — Z79899 Other long term (current) drug therapy: Secondary | ICD-10-CM

## 2022-04-08 DIAGNOSIS — I480 Paroxysmal atrial fibrillation: Secondary | ICD-10-CM

## 2022-04-08 DIAGNOSIS — I700 Atherosclerosis of aorta: Secondary | ICD-10-CM | POA: Diagnosis not present

## 2022-04-08 DIAGNOSIS — I471 Supraventricular tachycardia, unspecified: Secondary | ICD-10-CM

## 2022-04-08 DIAGNOSIS — L853 Xerosis cutis: Secondary | ICD-10-CM

## 2022-04-08 DIAGNOSIS — Z515 Encounter for palliative care: Secondary | ICD-10-CM

## 2022-04-08 DIAGNOSIS — F331 Major depressive disorder, recurrent, moderate: Secondary | ICD-10-CM

## 2022-04-08 DIAGNOSIS — I495 Sick sinus syndrome: Secondary | ICD-10-CM

## 2022-04-08 DIAGNOSIS — F01B0 Vascular dementia, moderate, without behavioral disturbance, psychotic disturbance, mood disturbance, and anxiety: Secondary | ICD-10-CM

## 2022-04-08 DIAGNOSIS — I10 Essential (primary) hypertension: Secondary | ICD-10-CM

## 2022-04-08 DIAGNOSIS — Z9181 History of falling: Secondary | ICD-10-CM

## 2022-04-08 DIAGNOSIS — J8410 Pulmonary fibrosis, unspecified: Secondary | ICD-10-CM

## 2022-04-08 DIAGNOSIS — I2781 Cor pulmonale (chronic): Secondary | ICD-10-CM

## 2022-04-08 NOTE — Progress Notes (Signed)
Location:  [facility].  Nursing Home Room Number: [facility] Place of Service:  SNF [phone]) Provider:  Dr. Amada Kingfisher, MD  Patient Care Team: Dewayne Shorter, MD as PCP - General (Family Medicine) Minna Merritts, MD as PCP - Cardiology (Cardiology)  Extended Emergency Contact Information Primary Emergency Contact: [employer] Phone: [phone] Mobile Phone: [phone] Relation: Daughter Secondary Emergency Contact: [place] Phone: [phone] Mobile Phone: [phone] Relation: Daughter  Code Status:  DNR Goals of care: Advanced Directive information    04/08/2022   11:13 AM  Advanced Directives  Does Patient Have a Medical Advance Directive? Yes  Type of Advance Directive Out of facility DNR (pink MOST or yellow form)  Does patient want to make changes to medical advance directive? No - Patient declined     Chief Complaint  Patient presents with   Medical Management of Chronic Issues    Medical Management of Chronic Issues.     HPI:  Pt is a 88 y.o. female seen today for medical management of chronic diseases.    She says it Is flal 1946 and that she is 88 years old.  When told it is November knows that means thanksgiving. Gives her childrens names as well.   Thinks we are in [place], [place].    Nursing has no concerns for her today. She has had a couple of non-injurous falls in the last month.   Past Medical History:  Diagnosis Date   Anemia    distant past   Arthritis    fingers, hips   Cancer ([place])    skin cancer; some spots on left side of face; been 5 years since last treated;    Headache    History of echocardiogram    a. TTE 04/06/17: EF > 55%, no RWMA, no LA mass, severe TR, RV systolic dysfunction, elevated PASP; b. TTE 04/2017: EF 65-70%, no RWMA, Gr1DD, calcified mitral annulus w/ mild MR, mild to mod dilated LA w/ irregular, echodense material in the LA incompletely  visualized, possibly representing artifact vs thrombus, or intracardiac mass, rec TEE, mod TR, mildly dilated RV, inc PASP   History of nuclear stress test    a. 12/18: no sig ischemia, low risk   HLD (hyperlipidemia)    Left atrial mass    a. see echo from 04/2017 - ? mass vs LA thrombus in the setting of PAF -->Eliquis.   Osteoporosis    PAF (paroxysmal atrial fibrillation) ([place])    a. diagnosed 05/10/2017; b. CHADS2VASc --> 3 (age x 2, female)--> Eliquis 2.5 (age/wt).   Past Surgical History:  Procedure Laterality Date   CATARACT EXTRACTION W/PHACO Left 09/14/2015   Procedure: CATARACT EXTRACTION PHACO AND INTRAOCULAR LENS PLACEMENT (IOC);  Surgeon: Ronnell Freshwater, MD;  Location: [facility];  Service: Ophthalmology;  Laterality: Left;  [place] Right    cataract surgery schedule for Easter Monday 2017;     Allergies  Allergen Reactions   Azithromycin Shortness Of Breath and Other (See Comments)    Syncope Can not stand up, feels very sick, weak Syncope Syncope Can not stand up, feels very sick, weak Syncope Syncope Can not stand up, feels very sick, weak Syncope Can not stand up, feels very sick, weak Syncope   Clindamycin Other (See Comments)    GI problem  GI problem  GI problem    Clindamycin/Lincomycin Other (See Comments)    Reaction: unknown   Hyoscyamine  Diarrhea   Lincomycin Other (See Comments)   Maxitrol [Neomycin-Polymyxin-Dexameth] Other (See Comments)    Eye irritation   Other Other (See Comments)    Eye irritation Eye irritation Eye irritation   Timolol Other (See Comments)    Eye irritation Eye irritation Eye irritation Eye irritation   Codeine Other (See Comments) and Nausea Only    Altered mental status FATIGUE, SICK FEELING Altered mental status Altered mental status FATIGUE, SICK FEELING   Penicillins Rash    Has patient had a PCN reaction causing immediate rash, facial/tongue/throat swelling, SOB or  lightheadedness with hypotension: Yes Has patient had a PCN reaction causing severe rash involving mucus membranes or skin necrosis: No Has patient had a PCN reaction that required hospitalization: No Has patient had a PCN reaction occurring within the last 10 years: No If all of the above answers are "NO", then may proceed with Cephalosporin use.    Outpatient Encounter Medications as of 04/08/2022  Medication Sig   acetaminophen (TYLENOL) 325 MG tablet Take 650 mg by mouth every 4 (four) hours as needed for mild pain or moderate pain.   aspirin 81 MG chewable tablet Chew 1 tablet (81 mg total) by mouth daily.   bisacodyl (DULCOLAX) 10 MG suppository Insert 1 suppository rectally every 24 hours as needed for constipation.   carboxymethylcellulose (REFRESH PLUS) 0.5 % SOLN Place 1 drop into both eyes every 8 (eight) hours as needed.   chlorhexidine (PERIDEX) 0.12 % solution Use as directed 15 mLs in the mouth or throat 2 (two) times daily.   diclofenac Sodium (VOLTAREN) 1 % GEL Apply 1 g topically every 6 (six) hours as needed (pain). Apply to left hand   famotidine (PEPCID) 40 MG tablet Take 40 mg by mouth at bedtime.   feeding supplement (ENSURE ENLIVE / ENSURE PLUS) LIQD Take 237 mLs by mouth 2 (two) times daily between meals.   fexofenadine (ALLEGRA) 180 MG tablet Take 180 mg by mouth daily.   guaifenesin (ROBITUSSIN) 100 MG/5ML syrup Take 200 mg by mouth every 6 (six) hours as needed for cough.   Infant Care Products [facility]) OINT Apply to sacrum topically every shift for prevention, then cover with sacral foam dressing.   loratadine (CLARITIN) 10 MG tablet Take 10 mg by mouth daily as needed for itching.   LORazepam (ATIVAN) 0.5 MG tablet Take 0.25 mg by mouth every 8 (eight) hours as needed for anxiety.   LUMIGAN 0.01 % SOLN Place 1 drop into both eyes at bedtime.    miconazole (MICOTIN) 2 % cream Apply 1 application topically 2 (two) times daily. (affected area on back)    mirtazapine (REMERON) 7.5 MG tablet Take 7.5 mg by mouth at bedtime.   morphine (ROXANOL) 20 MG/ML concentrated solution Take 0.25 mLs (5 mg total) by mouth every 2 (two) hours as needed for severe pain (dyspnea).   Multiple Vitamins-Minerals (DECUBI-VITE PO) Take 1 capsule by mouth daily.   ondansetron (ZOFRAN) 4 MG tablet Take 4 mg by mouth every 8 (eight) hours as needed for nausea or vomiting.   OXYGEN 2lpm via nasal cannula continously to keep oxygen saturation 90% or greater.   PARoxetine (PAXIL) 10 MG tablet Take 10 mg by mouth daily.   senna (SENOKOT) 8.6 MG tablet Take 1 tablet by mouth daily.   traMADol (ULTRAM) 50 MG tablet Take 50 mg by mouth every 8 (eight) hours as needed.   [DISCONTINUED] Cholecalciferol (VITAMIN D3) 50 MCG (2000 UT) TABS Take 2,000 Units by mouth  at bedtime.    [DISCONTINUED] clopidogrel (PLAVIX) 75 MG tablet Take 75 mg by mouth daily.   [DISCONTINUED] nystatin (MYCOSTATIN/NYSTOP) powder Apply topically 3 (three) times daily.   [DISCONTINUED] olmesartan (BENICAR) 5 MG tablet Take 5 mg by mouth at bedtime.    [DISCONTINUED] PARoxetine (PAXIL) 20 MG tablet Take 20 mg by mouth daily.    No facility-administered encounter medications on file as of 04/08/2022.    Review of Systems  Unable to perform ROS: Dementia    Immunization History  Administered Date(s) Administered   Influenza Split 03/05/2014, 03/11/2015   Influenza, High Dose Seasonal PF 03/15/2022   Influenza-Unspecified 03/02/2017   Tdap 09/01/2015   Pertinent  Health Maintenance Due  Topic Date Due   DEXA SCAN  Never done   INFLUENZA VACCINE  Completed      03/17/2020   10:00 AM 03/17/2020    8:45 PM 03/18/2020   11:00 AM 05/16/2021    6:02 PM 06/18/2021   11:01 AM  Fall Risk  Patient Fall Risk Level High fall risk High fall risk High fall risk High fall risk Low fall risk   Functional Status Survey:    Vitals:   04/08/22 0950  BP: 106/73  Pulse: 83  Resp: (!) 21  Temp: (!) 97.5  F (36.4 C)  Weight: 83 lb 9.6 oz (37.9 kg)  Height: 5' (1.524 m)   Body mass index is 16.33 kg/m. Physical Exam Constitutional:      Comments: Cachectic  Cardiovascular:     Rate and Rhythm: Normal rate and regular rhythm.     Pulses: Normal pulses.     Heart sounds: Normal heart sounds.  Pulmonary:     Effort: Pulmonary effort is normal.     Breath sounds: Normal breath sounds.  Abdominal:     General: Abdomen is flat.     Palpations: Abdomen is soft.  Skin:    Comments: Dry, scaly skin  Neurological:     Mental Status: She is alert.  Psychiatric:        Mood and Affect: Mood normal.     Labs reviewed: Recent Labs    05/16/21 1839 06/18/21 1126  NA 141 140  K 3.8 3.6  CL 102 104  CO2 31 29  GLUCOSE 110* 91  BUN 21 15  CREATININE 0.71 0.71  CALCIUM 8.8* 9.0   Recent Labs    05/16/21 1839  AST 24  ALT 10  ALKPHOS 68  BILITOT 0.7  PROT 7.0  ALBUMIN 3.4*   Recent Labs    05/16/21 1839 06/18/21 1126  WBC 8.8 5.8  NEUTROABS 7.2  --   HGB 9.0* 10.3*  HCT 28.6* 33.4*  MCV 91.1 91.5  PLT 227 240   Lab Results  Component Value Date   TSH 1.854 05/10/2017   Lab Results  Component Value Date   HGBA1C 4.9 03/16/2020   Lab Results  Component Value Date   CHOL 234 (H) 03/16/2020   HDL 51 03/16/2020   LDLCALC 168 (H) 03/16/2020   TRIG 73 03/16/2020   CHOLHDL 4.6 03/16/2020    Significant Diagnostic Results in last 30 days:  No results found.  Assessment/Plan 1. Xerosis cutis Patient was previously receiving antihistamines for itching. Discussed importance of skin moisturizers in a patient with severely dry skin and flaking such as in this patient. Will start Cerave daily for skin. Discontinue fexofenadine and claritin.   2. Atherosclerosis of aorta ([place]) 3. Acute CVA (cerebrovascular accident) ([place]) 4. PAF (paroxysmal  atrial fibrillation) ([place]) 5. SVT (supraventricular tachycardia) 6. Sick sinus syndrome [facility]) Patient with significant  cardiovascular history. Currently in long term care  7. Primary hypertension BP low normal without medications at this time. CTM.   8. Cor pulmonale ([place]) 9. Fibrosis of lung ([place]) Patient is on chronic oxygen supplementation with 2L [place].   10. Moderate episode of recurrent major depressive disorder ([place]) Patient's mood has been stable for some time. She says she has frustrations with medications and would be open to discontinuing medications. Plan to taper Paxil to 5 mg for 3 weeks then stop. Plan to continue Remeron and ativan PRN given patient's hospice status.   11. Hospice care patient 12. Protein deficiency Patient is on hospice and continues to struggle with weight loss. She asks how much longer she has, and it's unclear at this time. Continue supportive care.   13. Polypharmacy Numerous meds with contradicting indications. Patient is currently on hospice and getting tired of number of medications. Plan to wean pepcid, d/c aspirin, d/c Claritin and zyrtec. Wean paxil to stop in the next 4 weeks. For pepcid will plan to decrease dose for 2 weeks then stop medication. Discontinue tramadol- patient has only requested 1x per month for the last 3 months and has morphine available for hospice.   37. Fall Risk Patient unable to participate in therapy based on her stage of dementia. Continue supportive measures and decrease medications that can contribute to fall risk.   Family/ staff Communication: nursing staff updated, called daughter for update unable to contact.   Labs/tests ordered:  none  Tomasa Rand, MD, [facility] [phone]

## 2022-05-04 ENCOUNTER — Non-Acute Institutional Stay (SKILLED_NURSING_FACILITY): Admitting: Student

## 2022-05-04 ENCOUNTER — Encounter: Payer: Self-pay | Admitting: Student

## 2022-05-04 DIAGNOSIS — L853 Xerosis cutis: Secondary | ICD-10-CM

## 2022-05-04 DIAGNOSIS — L299 Pruritus, unspecified: Secondary | ICD-10-CM

## 2022-05-04 NOTE — Progress Notes (Unsigned)
Location:  [facility].  Nursing Home Room Number: [address] Place of Service:  SNF [phone]) Provider:  Dr. Amada Kingfisher, MD  Patient Care Team: Dewayne Shorter, MD as PCP - General (Family Medicine) Minna Merritts, MD as PCP - Cardiology (Cardiology)  Extended Emergency Contact Information Primary Emergency Contact: [place] Phone: [phone] Mobile Phone: [phone] Relation: Daughter Secondary Emergency Contact: [place] Phone: [phone] Mobile Phone: [phone] Relation: Daughter  Code Status:  DNR Goals of care: Advanced Directive information    05/04/2022    1:27 PM  Advanced Directives  Does Patient Have a Medical Advance Directive? Yes  Type of Advance Directive Out of facility DNR (pink MOST or yellow form)  Does patient want to make changes to medical advance directive? No - Patient declined     Chief Complaint  Patient presents with   Acute Visit    Hospice Care.     HPI:  Pt is a 88 y.o. female seen today for an acute visit for itching. Hospice nurse noted that patient has scratched all over her body leaving open sores. When asking patient if she would like something for itching, she yells, "yes please!" Patient is on hospice.    Past Medical History:  Diagnosis Date   Anemia    distant past   Arthritis    fingers, hips   Cancer ([place])    skin cancer; some spots on left side of face; been 5 years since last treated;    Headache    History of echocardiogram    a. TTE 04/06/17: EF > 55%, no RWMA, no LA mass, severe TR, RV systolic dysfunction, elevated PASP; b. TTE 04/2017: EF 65-70%, no RWMA, Gr1DD, calcified mitral annulus w/ mild MR, mild to mod dilated LA w/ irregular, echodense material in the LA incompletely visualized, possibly representing artifact vs thrombus, or intracardiac mass, rec TEE, mod TR, mildly dilated RV, inc PASP   History of nuclear stress test    a.  12/18: no sig ischemia, low risk   HLD (hyperlipidemia)    Left atrial mass    a. see echo from 04/2017 - ? mass vs LA thrombus in the setting of PAF -->Eliquis.   Osteoporosis    PAF (paroxysmal atrial fibrillation) ([place])    a. diagnosed 05/10/2017; b. CHADS2VASc --> 3 (age x 2, female)--> Eliquis 2.5 (age/wt).   Past Surgical History:  Procedure Laterality Date   CATARACT EXTRACTION W/PHACO Left 09/14/2015   Procedure: CATARACT EXTRACTION PHACO AND INTRAOCULAR LENS PLACEMENT (IOC);  Surgeon: Ronnell Freshwater, MD;  Location: [facility];  Service: Ophthalmology;  Laterality: Left;  [place] Right    cataract surgery schedule for Easter Monday 2017;     Allergies  Allergen Reactions   Azithromycin Shortness Of Breath and Other (See Comments)    Syncope Can not stand up, feels very sick, weak Syncope Syncope Can not stand up, feels very sick, weak Syncope Syncope Can not stand up, feels very sick, weak Syncope Can not stand up, feels very sick, weak Syncope   Clindamycin Other (See Comments)    GI problem  GI problem  GI problem    Clindamycin/Lincomycin Other (See Comments)    Reaction: unknown   Hyoscyamine Diarrhea   Lincomycin Other (See Comments)   Maxitrol [Neomycin-Polymyxin-Dexameth] Other (See Comments)    Eye irritation   Other Other (See Comments)    Eye irritation Eye irritation Eye  irritation   Timolol Other (See Comments)    Eye irritation Eye irritation Eye irritation Eye irritation   Codeine Other (See Comments) and Nausea Only    Altered mental status FATIGUE, SICK FEELING Altered mental status Altered mental status FATIGUE, SICK FEELING   Penicillins Rash    Has patient had a PCN reaction causing immediate rash, facial/tongue/throat swelling, SOB or lightheadedness with hypotension: Yes Has patient had a PCN reaction causing severe rash involving mucus membranes or skin necrosis: No Has patient had a PCN reaction  that required hospitalization: No Has patient had a PCN reaction occurring within the last 10 years: No If all of the above answers are "NO", then may proceed with Cephalosporin use.    Outpatient Encounter Medications as of 05/04/2022  Medication Sig   acetaminophen (TYLENOL) 325 MG tablet Take 650 mg by mouth every 4 (four) hours as needed for mild pain or moderate pain.   bisacodyl (DULCOLAX) 10 MG suppository Insert 1 suppository rectally every 24 hours as needed for constipation.   carboxymethylcellulose (REFRESH PLUS) 0.5 % SOLN Place 1 drop into both eyes every 8 (eight) hours as needed.   chlorhexidine (PERIDEX) 0.12 % solution Use as directed 15 mLs in the mouth or throat 2 (two) times daily.   diclofenac Sodium (VOLTAREN) 1 % GEL Apply 1 g topically every 6 (six) hours as needed (pain). Apply to left hand   Emollient (CERAVE) CREA Apply to entire body topically every day for dry skin.   feeding supplement (ENSURE ENLIVE / ENSURE PLUS) LIQD Take 237 mLs by mouth 2 (two) times daily between meals.   guaifenesin (ROBITUSSIN) 100 MG/5ML syrup Take 200 mg by mouth every 6 (six) hours as needed for cough.   hydrOXYzine (ATARAX) 25 MG tablet Take 25 mg by mouth every 6 (six) hours as needed.   Infant Care Products [facility]) OINT Apply to sacrum topically every shift for prevention, then cover with sacral foam dressing.   LORazepam (ATIVAN) 0.5 MG tablet Take 0.25 mg by mouth every 8 (eight) hours as needed for anxiety.   LUMIGAN 0.01 % SOLN Place 1 drop into both eyes at bedtime.    miconazole (MICOTIN) 2 % cream Apply 1 application topically 2 (two) times daily. (affected area on back)   mirtazapine (REMERON) 7.5 MG tablet Take 7.5 mg by mouth at bedtime.   morphine (ROXANOL) 20 MG/ML concentrated solution Take 0.25 mLs (5 mg total) by mouth every 2 (two) hours as needed for severe pain (dyspnea).   Multiple Vitamins-Minerals (DECUBI-VITE PO) Take 1 capsule by mouth daily.   ondansetron  (ZOFRAN) 4 MG tablet Take 4 mg by mouth every 8 (eight) hours as needed for nausea or vomiting.   OXYGEN 2lpm via nasal cannula continously to keep oxygen saturation 90% or greater.   senna (SENOKOT) 8.6 MG tablet Take 1 tablet by mouth daily.   No facility-administered encounter medications on file as of 05/04/2022.    Review of Systems  All other systems reviewed and are negative.   Immunization History  Administered Date(s) Administered   Influenza Split 03/05/2014, 03/11/2015   Influenza, High Dose Seasonal PF 03/15/2022   Influenza-Unspecified 03/02/2017   Tdap 09/01/2015   Pertinent  Health Maintenance Due  Topic Date Due   DEXA SCAN  Never done   INFLUENZA VACCINE  Completed      03/17/2020   10:00 AM 03/17/2020    8:45 PM 03/18/2020   11:00 AM 05/16/2021    6:02 PM 06/18/2021  11:01 AM  Fall Risk  Patient Fall Risk Level High fall risk High fall risk High fall risk High fall risk Low fall risk   Functional Status Survey:    Vitals:   05/04/22 1311  BP: 130/80  Pulse: 81  Resp: 20  Temp: 97.8 F (36.6 C)  SpO2: 99%  Weight: 80 lb 9.6 oz (36.6 kg)  Height: 5' (1.524 m)   Body mass index is 15.74 kg/m. Physical Exam Vitals reviewed.  Skin:    Comments: Warm, dry, various excoriations of different stages of bilateral upper and lower extremities.   Neurological:     Mental Status: She is alert.     Labs reviewed: Recent Labs    05/16/21 1839 06/18/21 1126  NA 141 140  K 3.8 3.6  CL 102 104  CO2 31 29  GLUCOSE 110* 91  BUN 21 15  CREATININE 0.71 0.71  CALCIUM 8.8* 9.0   Recent Labs    05/16/21 1839  AST 24  ALT 10  ALKPHOS 68  BILITOT 0.7  PROT 7.0  ALBUMIN 3.4*   Recent Labs    05/16/21 1839 06/18/21 1126  WBC 8.8 5.8  NEUTROABS 7.2  --   HGB 9.0* 10.3*  HCT 28.6* 33.4*  MCV 91.1 91.5  PLT 227 240   Lab Results  Component Value Date   TSH 1.854 05/10/2017   Lab Results  Component Value Date   HGBA1C 4.9 03/16/2020    Lab Results  Component Value Date   CHOL 234 (H) 03/16/2020   HDL 51 03/16/2020   LDLCALC 168 (H) 03/16/2020   TRIG 73 03/16/2020   CHOLHDL 4.6 03/16/2020    Significant Diagnostic Results in last 30 days:  No results found.  Assessment/Plan Pruritus  Xerosis cutis Patient with itching and goal of comfort. Will start Atarax 25 mg q6 hours PRN for itching.    Family/ staff Communication: nursing  Labs/tests ordered:  none

## 2022-06-02 ENCOUNTER — Other Ambulatory Visit: Payer: Self-pay | Admitting: Nurse Practitioner

## 2022-06-02 MED ORDER — LORAZEPAM 0.5 MG PO TABS: 0.2500 mg | ORAL_TABLET | Freq: Three times a day (TID) | ORAL | 5 refills | Status: AC | PRN

## 2022-06-09 ENCOUNTER — Non-Acute Institutional Stay (SKILLED_NURSING_FACILITY): Admitting: Nurse Practitioner

## 2022-06-09 ENCOUNTER — Encounter: Payer: Self-pay | Admitting: Nurse Practitioner

## 2022-06-09 DIAGNOSIS — F331 Major depressive disorder, recurrent, moderate: Secondary | ICD-10-CM

## 2022-06-09 DIAGNOSIS — J8410 Pulmonary fibrosis, unspecified: Secondary | ICD-10-CM

## 2022-06-09 DIAGNOSIS — Z66 Do not resuscitate: Secondary | ICD-10-CM | POA: Diagnosis not present

## 2022-06-09 DIAGNOSIS — E43 Unspecified severe protein-calorie malnutrition: Secondary | ICD-10-CM | POA: Diagnosis not present

## 2022-06-09 DIAGNOSIS — I10 Essential (primary) hypertension: Secondary | ICD-10-CM

## 2022-06-09 DIAGNOSIS — F01B0 Vascular dementia, moderate, without behavioral disturbance, psychotic disturbance, mood disturbance, and anxiety: Secondary | ICD-10-CM

## 2022-06-09 NOTE — Progress Notes (Signed)
Location:  Other [facility]) Nursing Home Room Number: [place] of Service:  SNF ([phone])  Sirius Woodford K. Dewaine Oats, NP   Patient Care Team: Dewayne Shorter, MD as PCP - General (Family Medicine) Minna Merritts, MD as PCP - Cardiology (Cardiology)  Extended Emergency Contact Information Primary Emergency Contact: [place] Phone: [phone] Mobile Phone: [phone] Relation: Daughter Secondary Emergency Contact: [place] Records [place] of [place] Phone: [phone] Mobile Phone: [phone] Relation: Daughter  Goals of care: Advanced Directive information    06/09/2022    2:25 PM  Advanced Directives  Does Patient Have a Medical Advance Directive? Yes  Type of Advance Directive Out of facility DNR (pink MOST or yellow form)  Does patient want to make changes to medical advance directive? No - Patient declined  Pre-existing out of facility DNR order (yellow form or pink MOST form) Yellow form placed in chart (order not valid for inpatient use)     Chief Complaint  Patient presents with   Medical Management of Chronic Issues    Routine visit. Discuss need for additional covid boosters, shingrix, pneumonia vaccine and DEXA or post pone if patient refuses or is not a candidate. Tonette Bihari and medications are a reflection of [facility]MR system, [employer] .    HPI:  Pt is a 89 y.o. female seen today for medical management of chronic disease.  Pt has advanced dementia and on hospice services.  Staff without concerns.  She continues on Remeron for appetite. Weight has been stable over the last few months.  Paxil and pepcid were stopped at last visit and she has been doing well since. No signs of worsening depression/anxiety No increase in GERD.     Past Medical History:  Diagnosis Date   Anemia    distant past   Arthritis    fingers, hips   Cancer ([place])    skin cancer; some spots on left side of face; been 5 years since last treated;     Headache    History of echocardiogram    a. TTE 04/06/17: EF > 55%, no RWMA, no LA mass, severe TR, RV systolic dysfunction, elevated PASP; b. TTE 04/2017: EF 65-70%, no RWMA, Gr1DD, calcified mitral annulus w/ mild MR, mild to mod dilated LA w/ irregular, echodense material in the LA incompletely visualized, possibly representing artifact vs thrombus, or intracardiac mass, rec TEE, mod TR, mildly dilated RV, inc PASP   History of nuclear stress test    a. 12/18: no sig ischemia, low risk   HLD (hyperlipidemia)    Left atrial mass    a. see echo from 04/2017 - ? mass vs LA thrombus in the setting of PAF -->Eliquis.   Osteoporosis    PAF (paroxysmal atrial fibrillation) ([place])    a. diagnosed 05/10/2017; b. CHADS2VASc --> 3 (age x 2, female)--> Eliquis 2.5 (age/wt).   Past Surgical History:  Procedure Laterality Date   CATARACT EXTRACTION W/PHACO Left 09/14/2015   Procedure: CATARACT EXTRACTION PHACO AND INTRAOCULAR LENS PLACEMENT (IOC);  Surgeon: Ronnell Freshwater, MD;  Location: [place];  Service: Ophthalmology;  Laterality: Left;  [place] Right    cataract surgery schedule for Easter Monday 2017;     Allergies  Allergen Reactions   Azithromycin Shortness Of Breath and Other (See Comments)    Syncope Can not stand up, feels very sick, weak Syncope Syncope Can not stand up, feels very sick, weak Syncope Syncope Can not stand up, feels  very sick, weak Syncope Can not stand up, feels very sick, weak Syncope   Clindamycin Other (See Comments)    GI problem  GI problem  GI problem    Clindamycin/Lincomycin Other (See Comments)    Reaction: unknown   Hyoscyamine Diarrhea   Lincomycin Other (See Comments)   Maxitrol [Neomycin-Polymyxin-Dexameth] Other (See Comments)    Eye irritation   Other Other (See Comments)    Eye irritation Eye irritation Eye irritation   Timolol Other (See Comments)    Eye irritation Eye irritation Eye irritation Eye  irritation   Codeine Other (See Comments) and Nausea Only    Altered mental status FATIGUE, SICK FEELING Altered mental status Altered mental status FATIGUE, SICK FEELING   Penicillins Rash    Has patient had a PCN reaction causing immediate rash, facial/tongue/throat swelling, SOB or lightheadedness with hypotension: Yes Has patient had a PCN reaction causing severe rash involving mucus membranes or skin necrosis: No Has patient had a PCN reaction that required hospitalization: No Has patient had a PCN reaction occurring within the last 10 years: No If all of the above answers are "NO", then may proceed with Cephalosporin use.    Outpatient Encounter Medications as of 06/09/2022  Medication Sig   acetaminophen (TYLENOL) 325 MG tablet Take 650 mg by mouth every 4 (four) hours as needed for mild pain or moderate pain.   bisacodyl (DULCOLAX) 10 MG suppository Insert 1 suppository rectally every 24 hours as needed for constipation.   carboxymethylcellulose (REFRESH PLUS) 0.5 % SOLN Place 1 drop into both eyes every 8 (eight) hours as needed.   chlorhexidine (PERIDEX) 0.12 % solution Use as directed 15 mLs in the mouth or throat 2 (two) times daily.   diclofenac Sodium (VOLTAREN) 1 % GEL Apply 1 g topically every 6 (six) hours as needed (pain). Apply to left hand   Emollient (CERAVE) CREA Apply to entire body topically every day for dry skin.   guaifenesin (ROBITUSSIN) 100 MG/5ML syrup Take 100 mg by mouth every 6 (six) hours as needed (for wheezing).   hydrOXYzine (ATARAX) 25 MG tablet Take 25 mg by mouth every 6 (six) hours as needed.   Infant Care Products [facility]) OINT Apply to sacrum topically every shift for prevention, then cover with sacral foam dressing.   LORazepam (ATIVAN) 0.5 MG tablet Take 0.5 tablets (0.25 mg total) by mouth every 8 (eight) hours as needed for anxiety.   LUMIGAN 0.01 % SOLN Place 1 drop into both eyes at bedtime.    miconazole (MICOTIN) 2 % cream Apply 1  application topically 2 (two) times daily. (affected area on back)   mirtazapine (REMERON) 15 MG tablet Take 7.5 mg by mouth at bedtime.   morphine (ROXANOL) 20 MG/ML concentrated solution Take 0.25 mLs (5 mg total) by mouth every 2 (two) hours as needed for severe pain (dyspnea).   Multiple Vitamins-Minerals (DECUBI-VITE PO) Take 1 capsule by mouth daily.   ondansetron (ZOFRAN) 4 MG tablet Take 4 mg by mouth every 8 (eight) hours as needed for nausea or vomiting.   OXYGEN 2lpm via nasal cannula continously to keep oxygen saturation 90% or greater.   senna-docusate (SENOKOT-S) 8.6-50 MG tablet Take 1 tablet by mouth daily.   [DISCONTINUED] feeding supplement (ENSURE ENLIVE / ENSURE PLUS) LIQD Take 237 mLs by mouth 2 (two) times daily between meals.   [DISCONTINUED] mirtazapine (REMERON) 7.5 MG tablet Take 7.5 mg by mouth at bedtime.   [DISCONTINUED] senna (SENOKOT) 8.6 MG tablet Take 1  tablet by mouth daily.   No facility-administered encounter medications on file as of 06/09/2022.    Review of Systems  Unable to perform ROS: Dementia     Immunization History  Administered Date(s) Administered   Influenza Split 03/05/2014, 03/11/2015   Influenza, High Dose Seasonal PF 03/15/2022   Influenza-Unspecified 03/02/2017   Tdap 09/01/2015   Pertinent  Health Maintenance Due  Topic Date Due   DEXA SCAN  Never done   INFLUENZA VACCINE  Completed      03/17/2020   10:00 AM 03/17/2020    8:45 PM 03/18/2020   11:00 AM 05/16/2021    6:02 PM 06/18/2021   11:01 AM  Fall Risk  Patient Fall Risk Level High fall risk High fall risk High fall risk High fall risk Low fall risk   Functional Status Survey:    Vitals:   06/09/22 1421  BP: 120/62  Pulse: 68  Temp: (!) 97.5 F (36.4 C)  SpO2: 97%  Weight: 82 lb (37.2 kg)  Height: 5' (1.524 m)   Body mass index is 16.01 kg/m.  Filed Weights   06/09/22 1421  Weight: 82 lb (37.2 kg)    Physical Exam Constitutional:      General: She is  not in acute distress.    Appearance: She is well-developed. She is not diaphoretic.     Comments: Thin female, NAD  HENT:     Head: Normocephalic and atraumatic.     Mouth/Throat:     Pharynx: No oropharyngeal exudate.  Eyes:     Conjunctiva/sclera: Conjunctivae normal.     Pupils: Pupils are equal, round, and reactive to light.  Cardiovascular:     Rate and Rhythm: Normal rate and regular rhythm.     Heart sounds: Normal heart sounds.  Pulmonary:     Effort: Pulmonary effort is normal.     Breath sounds: Normal breath sounds.  Abdominal:     General: Bowel sounds are normal.     Palpations: Abdomen is soft.  Musculoskeletal:     Cervical back: Normal range of motion and neck supple.     Right lower leg: No edema.     Left lower leg: No edema.  Skin:    General: Skin is warm and dry.  Neurological:     Mental Status: She is alert. Mental status is at baseline.  Psychiatric:        Mood and Affect: Mood normal.     Labs reviewed: Recent Labs    06/18/21 1126  NA 140  K 3.6  CL 104  CO2 29  GLUCOSE 91  BUN 15  CREATININE 0.71  CALCIUM 9.0   No results for input(s): "AST", "ALT", "ALKPHOS", "BILITOT", "PROT", "ALBUMIN" in the last 8760 hours. Recent Labs    06/18/21 1126  WBC 5.8  HGB 10.3*  HCT 33.4*  MCV 91.5  PLT 240   Lab Results  Component Value Date   TSH 1.854 05/10/2017   Lab Results  Component Value Date   HGBA1C 4.9 03/16/2020   Lab Results  Component Value Date   CHOL 234 (H) 03/16/2020   HDL 51 03/16/2020   LDLCALC 168 (H) 03/16/2020   TRIG 73 03/16/2020   CHOLHDL 4.6 03/16/2020    Significant Diagnostic Results in last 30 days:  No results found.  Assessment/Plan 1. Primary hypertension Controlled, not on any medications  2. Do not resuscitate -continues on hospice - Do not attempt resuscitation (DNR)  3. Moderate episode of recurrent major  depressive disorder ([place]) -stable, she has titrated off paxil  4. Severe  protein-calorie malnutrition ([place]) -continues on remeron, staff assist in feeding.  Continues on hospice services  5. Fibrosis of lung ([place]) Continues on O2  6. Moderate vascular dementia without behavioral disturbance, psychotic disturbance, mood disturbance, or anxiety (HCC) -Stable, no acute changes in cognitive or functional status, continue supportive care.  -continues on hospice.   7.GERD Without worsening of symptoms off pepcid.     Carlos American. [place], [facility] [phone]

## 2022-06-17 ENCOUNTER — Encounter: Payer: Self-pay | Admitting: Student

## 2022-06-17 ENCOUNTER — Non-Acute Institutional Stay (SKILLED_NURSING_FACILITY): Admitting: Student

## 2022-06-17 DIAGNOSIS — Z515 Encounter for palliative care: Secondary | ICD-10-CM

## 2022-06-17 DIAGNOSIS — I495 Sick sinus syndrome: Secondary | ICD-10-CM

## 2022-06-17 DIAGNOSIS — I700 Atherosclerosis of aorta: Secondary | ICD-10-CM

## 2022-06-17 DIAGNOSIS — D689 Coagulation defect, unspecified: Secondary | ICD-10-CM | POA: Diagnosis not present

## 2022-06-17 DIAGNOSIS — I2781 Cor pulmonale (chronic): Secondary | ICD-10-CM

## 2022-06-17 NOTE — Progress Notes (Signed)
Location:  Other [place] Room Number: [place] of Service:  SNF [phone]) Provider:  Dewayne Shorter, MD  Patient Care Team: Dewayne Shorter, MD as PCP - General (Family Medicine) Minna Merritts, MD as PCP - Cardiology (Cardiology)  Extended Emergency Contact Information Primary Emergency Contact: [place] Phone: [phone] Mobile Phone: [phone] Relation: Daughter Secondary Emergency Contact: [place] Records [place] of [place] Phone: [phone] Mobile Phone: [phone] Relation: Daughter  Code Status:  DNR Goals of care: Advanced Directive information    06/17/2022    9:08 AM  Advanced Directives  Does Patient Have a Medical Advance Directive? Yes  Type of Paramedic of [place];Out of facility DNR (pink MOST or yellow form)  Does patient want to make changes to medical advance directive? No - Patient declined  Copy of [place] in Chart? No - copy requested     Chief Complaint  Patient presents with   Acute Visit    Hospice Care    HPI:  Pt is a 89 y.o. female seen today for an acute visit for hospice care. Nursing state patient has aspirated with eating. Patient states she is fine, hungry, and ready to eat. She knows it's 2024. She is oriented to self.    Past Medical History:  Diagnosis Date   Anemia    distant past   Arthritis    fingers, hips   Cancer ([place])    skin cancer; some spots on left side of face; been 5 years since last treated;    Headache    History of echocardiogram    a. TTE 04/06/17: EF > 55%, no RWMA, no LA mass, severe TR, RV systolic dysfunction, elevated PASP; b. TTE 04/2017: EF 65-70%, no RWMA, Gr1DD, calcified mitral annulus w/ mild MR, mild to mod dilated LA w/ irregular, echodense material in the LA incompletely visualized, possibly representing artifact vs thrombus, or intracardiac mass, rec TEE, mod TR, mildly dilated RV, inc PASP    History of nuclear stress test    a. 12/18: no sig ischemia, low risk   HLD (hyperlipidemia)    Left atrial mass    a. see echo from 04/2017 - ? mass vs LA thrombus in the setting of PAF -->Eliquis.   Osteoporosis    PAF (paroxysmal atrial fibrillation) ([place])    a. diagnosed 05/10/2017; b. CHADS2VASc --> 3 (age x 2, female)--> Eliquis 2.5 (age/wt).   Past Surgical History:  Procedure Laterality Date   CATARACT EXTRACTION W/PHACO Left 09/14/2015   Procedure: CATARACT EXTRACTION PHACO AND INTRAOCULAR LENS PLACEMENT (IOC);  Surgeon: Ronnell Freshwater, MD;  Location: [place];  Service: Ophthalmology;  Laterality: Left;  [place] Right    cataract surgery schedule for Easter Monday 2017;     Allergies  Allergen Reactions   Azithromycin Shortness Of Breath and Other (See Comments)    Syncope Can not stand up, feels very sick, weak Syncope Syncope Can not stand up, feels very sick, weak Syncope Syncope Can not stand up, feels very sick, weak Syncope Can not stand up, feels very sick, weak Syncope   Clindamycin Other (See Comments)    GI problem  GI problem  GI problem    Clindamycin/Lincomycin Other (See Comments)    Reaction: unknown   Hyoscyamine Diarrhea   Lincomycin Other (See Comments)   Maxitrol [Neomycin-Polymyxin-Dexameth] Other (See Comments)    Eye irritation   Other Other (See Comments)  Eye irritation Eye irritation Eye irritation   Timolol Other (See Comments)    Eye irritation Eye irritation Eye irritation Eye irritation   Codeine Other (See Comments) and Nausea Only    Altered mental status FATIGUE, SICK FEELING Altered mental status Altered mental status FATIGUE, SICK FEELING   Penicillins Rash    Has patient had a PCN reaction causing immediate rash, facial/tongue/throat swelling, SOB or lightheadedness with hypotension: Yes Has patient had a PCN reaction causing severe rash involving mucus membranes or skin  necrosis: No Has patient had a PCN reaction that required hospitalization: No Has patient had a PCN reaction occurring within the last 10 years: No If all of the above answers are "NO", then may proceed with Cephalosporin use.    Outpatient Encounter Medications as of 06/17/2022  Medication Sig   acetaminophen (TYLENOL) 325 MG tablet Take 650 mg by mouth every 4 (four) hours as needed for mild pain or moderate pain.   bisacodyl (DULCOLAX) 10 MG suppository Insert 1 suppository rectally every 24 hours as needed for constipation.   carboxymethylcellulose (REFRESH PLUS) 0.5 % SOLN Place 1 drop into both eyes every 8 (eight) hours as needed.   chlorhexidine (PERIDEX) 0.12 % solution Use as directed 15 mLs in the mouth or throat 2 (two) times daily.   diclofenac Sodium (VOLTAREN) 1 % GEL Apply 1 g topically every 6 (six) hours as needed (pain). Apply to left hand   Emollient (CERAVE) CREA Apply to entire body topically every day for dry skin.   guaifenesin (ROBITUSSIN) 100 MG/5ML syrup Take 100 mg by mouth every 6 (six) hours as needed (for wheezing).   hydrOXYzine (ATARAX) 25 MG tablet Take 25 mg by mouth every 6 (six) hours as needed.   Infant Care Products [facility]) OINT Apply to sacrum topically every shift for prevention, then cover with sacral foam dressing.   LORazepam (ATIVAN) 0.5 MG tablet Take 0.5 tablets (0.25 mg total) by mouth every 8 (eight) hours as needed for anxiety.   LUMIGAN 0.01 % SOLN Place 1 drop into both eyes at bedtime.    miconazole (MICOTIN) 2 % cream Apply 1 application topically 2 (two) times daily. (affected area on back)   morphine (ROXANOL) 20 MG/ML concentrated solution Take 0.25 mLs (5 mg total) by mouth every 2 (two) hours as needed for severe pain (dyspnea).   ondansetron (ZOFRAN) 4 MG tablet Take 4 mg by mouth every 8 (eight) hours as needed for nausea or vomiting.   OXYGEN 2lpm via nasal cannula continously to keep oxygen saturation 90% or greater.    senna-docusate (SENOKOT-S) 8.6-50 MG tablet Take 1 tablet by mouth daily.   [DISCONTINUED] mirtazapine (REMERON) 15 MG tablet Take 7.5 mg by mouth at bedtime.   [DISCONTINUED] Multiple Vitamins-Minerals (DECUBI-VITE PO) Take 1 capsule by mouth daily.   No facility-administered encounter medications on file as of 06/17/2022.    Review of Systems  All other systems reviewed and are negative.   Immunization History  Administered Date(s) Administered   Influenza Split 03/05/2014, 03/11/2015   Influenza, High Dose Seasonal PF 03/15/2022   Influenza-Unspecified 03/02/2017, 03/25/2020, 03/15/2021   Moderna SARS-COV2 Booster Vaccination 04/10/2020, 10/16/2020   Moderna Sars-Covid-2 Vaccination 07/24/2019, 08/26/2019, 02/19/2021   Pfizer Covid-19 Vaccine Bivalent Booster 54yr & up 02/19/2021   Tdap 09/01/2015   Zoster, Live 11/19/2014   Pertinent  Health Maintenance Due  Topic Date Due   DEXA SCAN  Never done   INFLUENZA VACCINE  Completed  03/17/2020   10:00 AM 03/17/2020    8:45 PM 03/18/2020   11:00 AM 05/16/2021    6:02 PM 06/18/2021   11:01 AM  Fall Risk  (RETIRED) Patient Fall Risk Level High fall risk High fall risk High fall risk High fall risk Low fall risk   Functional Status Survey:    Vitals:   06/17/22 0855  BP: 130/66  Pulse: 68  Resp: (!) 22  Temp: (!) 97.5 F (36.4 C)  SpO2: 97%  Weight: 82 lb 3.2 oz (37.3 kg)  Height: 5' (1.524 m)   Body mass index is 16.05 kg/m. Physical Exam Vitals reviewed.  Constitutional:      Comments: Frail, chronically ill appearing. Patient observed eating soft and pureed foods without issue.   Cardiovascular:     Rate and Rhythm: Normal rate.     Pulses: Normal pulses.  Pulmonary:     Effort: Pulmonary effort is normal.  Abdominal:     General: Abdomen is flat.  Skin:    General: Skin is warm and dry.  Neurological:     Mental Status: She is alert. Mental status is at baseline.     Labs reviewed: Recent Labs     06/18/21 1126  NA 140  K 3.6  CL 104  CO2 29  GLUCOSE 91  BUN 15  CREATININE 0.71  CALCIUM 9.0   No results for input(s): "AST", "ALT", "ALKPHOS", "BILITOT", "PROT", "ALBUMIN" in the last 8760 hours. Recent Labs    06/18/21 1126  WBC 5.8  HGB 10.3*  HCT 33.4*  MCV 91.5  PLT 240   Lab Results  Component Value Date   TSH 1.854 05/10/2017   Lab Results  Component Value Date   HGBA1C 4.9 03/16/2020   Lab Results  Component Value Date   CHOL 234 (H) 03/16/2020   HDL 51 03/16/2020   LDLCALC 168 (H) 03/16/2020   TRIG 73 03/16/2020   CHOLHDL 4.6 03/16/2020    Significant Diagnostic Results in last 30 days:  No results found.  Assessment/Plan Hospice care patient  [facility])  Coagulation disorder ([place])  Atherosclerosis of aorta (HCC), Chronic  Sick sinus syndrome ([place]), Chronic Patient has gained 2 lbs in the last month. She continues to have a good appetite and is conversant. Aspects of her history are inaccurate per nursing. Will plan to continue medications for comfort measures.    Family/ staff Communication: nursing - will call son at a later time.   Labs/tests ordered:  none

## 2022-06-19 DIAGNOSIS — D689 Coagulation defect, unspecified: Secondary | ICD-10-CM | POA: Insufficient documentation

## 2022-06-20 ENCOUNTER — Telehealth (SKILLED_NURSING_FACILITY): Admitting: Student

## 2022-06-20 DIAGNOSIS — Z515 Encounter for palliative care: Secondary | ICD-10-CM

## 2022-06-20 NOTE — Telephone Encounter (Signed)
Called all three childen's cell phone number without an answer left VM that I would try at a later date/time.  Spoke with her daughter Mary Sella. She states she has had a good appetite. She previously didn't love sweets before, but she has been doing well lately. She seems to always be hungry. She lost 5 lbs in the last year which is less than 10% of her weight. Her sister Lorre Nick, 87 yo) passed away about 1 week ago and she was very sad about the passing. She has been saying, "help me," a lot more often and has had more anxiety. Something seems to be bothering her, so want to make sure she is adequately managed from an anxiety standpoint. She brought her vanicream for her skin as well.   She plans to update her siblings of our conversation.   I spent >15 minutes discussing plan of care.   Tomasa Rand, MD, [facility] [phone]

## 2022-06-30 DEATH — deceased
